# Patient Record
Sex: Male | Born: 1960 | Race: Black or African American | Hispanic: No | Marital: Single | Smoking: Never smoker
Health system: Southern US, Community
[De-identification: ages and names within clinical notes are randomized; demographics above are authoritative.]

## PROBLEM LIST (undated history)

## (undated) DIAGNOSIS — I509 Heart failure, unspecified: Secondary | ICD-10-CM

## (undated) DIAGNOSIS — F1010 Alcohol abuse, uncomplicated: Secondary | ICD-10-CM

## (undated) DIAGNOSIS — B882 Other arthropod infestations: Principal | ICD-10-CM

## (undated) HISTORY — DX: Other arthropod infestations: B88.2

## (undated) HISTORY — DX: Alcohol abuse, uncomplicated: F10.10

## (undated) HISTORY — PX: HERNIA REPAIR: SHX51

---

## 1999-04-04 ENCOUNTER — Emergency Department (HOSPITAL_COMMUNITY): Admission: EM | Admit: 1999-04-04 | Discharge: 1999-04-04 | Payer: Self-pay | Admitting: Emergency Medicine

## 1999-04-04 ENCOUNTER — Encounter: Payer: Self-pay | Admitting: Emergency Medicine

## 1999-05-27 ENCOUNTER — Emergency Department (HOSPITAL_COMMUNITY): Admission: EM | Admit: 1999-05-27 | Discharge: 1999-05-27 | Payer: Self-pay | Admitting: Emergency Medicine

## 2000-04-29 ENCOUNTER — Emergency Department (HOSPITAL_COMMUNITY): Admission: EM | Admit: 2000-04-29 | Discharge: 2000-04-29 | Payer: Self-pay | Admitting: *Deleted

## 2000-04-29 ENCOUNTER — Encounter: Payer: Self-pay | Admitting: *Deleted

## 2000-05-08 ENCOUNTER — Emergency Department (HOSPITAL_COMMUNITY): Admission: EM | Admit: 2000-05-08 | Discharge: 2000-05-08 | Payer: Self-pay | Admitting: Emergency Medicine

## 2000-08-23 ENCOUNTER — Encounter: Payer: Self-pay | Admitting: Emergency Medicine

## 2000-08-23 ENCOUNTER — Inpatient Hospital Stay (HOSPITAL_COMMUNITY): Admission: EM | Admit: 2000-08-23 | Discharge: 2000-08-26 | Payer: Self-pay | Admitting: Emergency Medicine

## 2000-08-24 ENCOUNTER — Encounter: Payer: Self-pay | Admitting: General Surgery

## 2000-10-13 ENCOUNTER — Encounter: Payer: Self-pay | Admitting: Gastroenterology

## 2000-10-13 ENCOUNTER — Ambulatory Visit (HOSPITAL_COMMUNITY): Admission: RE | Admit: 2000-10-13 | Discharge: 2000-10-13 | Payer: Self-pay | Admitting: Gastroenterology

## 2000-10-19 ENCOUNTER — Emergency Department (HOSPITAL_COMMUNITY): Admission: EM | Admit: 2000-10-19 | Discharge: 2000-10-19 | Payer: Self-pay | Admitting: Emergency Medicine

## 2000-11-05 ENCOUNTER — Ambulatory Visit (HOSPITAL_COMMUNITY): Admission: RE | Admit: 2000-11-05 | Discharge: 2000-11-05 | Payer: Self-pay | Admitting: General Surgery

## 2001-01-16 ENCOUNTER — Emergency Department (HOSPITAL_COMMUNITY): Admission: EM | Admit: 2001-01-16 | Discharge: 2001-01-16 | Payer: Self-pay | Admitting: Emergency Medicine

## 2002-02-02 ENCOUNTER — Encounter: Payer: Self-pay | Admitting: Emergency Medicine

## 2002-02-02 ENCOUNTER — Inpatient Hospital Stay (HOSPITAL_COMMUNITY): Admission: EM | Admit: 2002-02-02 | Discharge: 2002-02-03 | Payer: Self-pay | Admitting: *Deleted

## 2003-08-27 ENCOUNTER — Emergency Department (HOSPITAL_COMMUNITY): Admission: EM | Admit: 2003-08-27 | Discharge: 2003-08-27 | Payer: Self-pay | Admitting: *Deleted

## 2003-09-19 ENCOUNTER — Emergency Department (HOSPITAL_COMMUNITY): Admission: EM | Admit: 2003-09-19 | Discharge: 2003-09-19 | Payer: Self-pay | Admitting: Emergency Medicine

## 2003-09-19 ENCOUNTER — Encounter: Payer: Self-pay | Admitting: Emergency Medicine

## 2004-10-08 ENCOUNTER — Emergency Department (HOSPITAL_COMMUNITY): Admission: EM | Admit: 2004-10-08 | Discharge: 2004-10-08 | Payer: Self-pay | Admitting: Emergency Medicine

## 2004-12-20 ENCOUNTER — Emergency Department (HOSPITAL_COMMUNITY): Admission: EM | Admit: 2004-12-20 | Discharge: 2004-12-20 | Payer: Self-pay | Admitting: Emergency Medicine

## 2005-01-14 ENCOUNTER — Inpatient Hospital Stay (HOSPITAL_COMMUNITY): Admission: EM | Admit: 2005-01-14 | Discharge: 2005-01-16 | Payer: Self-pay | Admitting: Emergency Medicine

## 2007-04-10 ENCOUNTER — Ambulatory Visit: Payer: Self-pay | Admitting: Hospitalist

## 2007-04-10 ENCOUNTER — Inpatient Hospital Stay (HOSPITAL_COMMUNITY): Admission: EM | Admit: 2007-04-10 | Discharge: 2007-04-12 | Payer: Self-pay | Admitting: Emergency Medicine

## 2009-01-17 ENCOUNTER — Inpatient Hospital Stay (HOSPITAL_COMMUNITY): Admission: EM | Admit: 2009-01-17 | Discharge: 2009-01-20 | Payer: Self-pay | Admitting: Emergency Medicine

## 2009-12-02 ENCOUNTER — Emergency Department (HOSPITAL_COMMUNITY): Admission: EM | Admit: 2009-12-02 | Discharge: 2009-12-02 | Payer: Self-pay | Admitting: Emergency Medicine

## 2010-05-13 ENCOUNTER — Emergency Department (HOSPITAL_COMMUNITY): Admission: EM | Admit: 2010-05-13 | Discharge: 2010-05-13 | Payer: Self-pay | Admitting: Emergency Medicine

## 2010-05-16 ENCOUNTER — Emergency Department (HOSPITAL_COMMUNITY): Admission: EM | Admit: 2010-05-16 | Discharge: 2010-05-16 | Payer: Self-pay | Admitting: Emergency Medicine

## 2010-08-22 ENCOUNTER — Emergency Department (HOSPITAL_COMMUNITY): Admission: EM | Admit: 2010-08-22 | Discharge: 2010-08-22 | Payer: Self-pay | Admitting: Emergency Medicine

## 2010-09-18 ENCOUNTER — Emergency Department (HOSPITAL_COMMUNITY): Admission: EM | Admit: 2010-09-18 | Discharge: 2010-09-18 | Payer: Self-pay | Admitting: Emergency Medicine

## 2011-02-15 LAB — DIFFERENTIAL
Basophils Absolute: 0 10*3/uL (ref 0.0–0.1)
Eosinophils Relative: 0 % (ref 0–5)
Lymphocytes Relative: 7 % — ABNORMAL LOW (ref 12–46)
Neutro Abs: 6.7 10*3/uL (ref 1.7–7.7)
Neutrophils Relative %: 77 % (ref 43–77)

## 2011-02-15 LAB — URINALYSIS, ROUTINE W REFLEX MICROSCOPIC
Nitrite: NEGATIVE
Specific Gravity, Urine: 1.028 (ref 1.005–1.030)
Urobilinogen, UA: 1 mg/dL (ref 0.0–1.0)

## 2011-02-15 LAB — COMPREHENSIVE METABOLIC PANEL
AST: 63 U/L — ABNORMAL HIGH (ref 0–37)
BUN: 10 mg/dL (ref 6–23)
CO2: 24 mEq/L (ref 19–32)
Chloride: 96 mEq/L (ref 96–112)
Creatinine, Ser: 1.26 mg/dL (ref 0.4–1.5)
GFR calc non Af Amer: >60 mL/min (ref >60)
Glucose, Bld: 137 mg/dL — ABNORMAL HIGH (ref 70–99)
Total Bilirubin: 0.6 mg/dL (ref 0.3–1.2)

## 2011-02-15 LAB — CBC
HCT: 43.9 % (ref 39.0–52.0)
Hemoglobin: 15.2 g/dL (ref 13.0–17.0)
MCHC: 34.5 g/dL (ref 30.0–36.0)
MCV: 106.2 fL — ABNORMAL HIGH (ref 78.0–100.0)
RBC: 4.14 MIL/uL — ABNORMAL LOW (ref 4.22–5.81)
WBC: 8.7 10*3/uL (ref 4.0–10.5)

## 2011-02-15 LAB — URINE MICROSCOPIC-ADD ON

## 2011-02-15 LAB — RAPID URINE DRUG SCREEN, HOSP PERFORMED
Benzodiazepines: NOT DETECTED
Cocaine: NOT DETECTED
Tetrahydrocannabinol: POSITIVE — AB

## 2011-02-15 LAB — LIPASE, BLOOD: Lipase: 40 U/L (ref 11–59)

## 2011-02-17 LAB — DIFFERENTIAL
Lymphs Abs: 2.4 10*3/uL (ref 0.7–4.0)
Monocytes Relative: 9 % (ref 3–12)
Neutro Abs: 2.6 10*3/uL (ref 1.7–7.7)
Neutrophils Relative %: 47 % (ref 43–77)

## 2011-02-17 LAB — BASIC METABOLIC PANEL
Calcium: 8.3 mg/dL — ABNORMAL LOW (ref 8.4–10.5)
Chloride: 105 mEq/L (ref 96–112)
Creatinine, Ser: 0.84 mg/dL (ref 0.4–1.5)
GFR calc Af Amer: >60 mL/min (ref >60)

## 2011-02-17 LAB — CBC
RBC: 3.42 MIL/uL — ABNORMAL LOW (ref 4.22–5.81)
WBC: 5.5 10*3/uL (ref 4.0–10.5)

## 2011-03-01 ENCOUNTER — Emergency Department (HOSPITAL_COMMUNITY)
Admission: EM | Admit: 2011-03-01 | Discharge: 2011-03-01 | Disposition: A | Discharge status: Home / Self Care | Payer: Self-pay | Attending: Emergency Medicine | Admitting: Emergency Medicine

## 2011-03-01 ENCOUNTER — Emergency Department (HOSPITAL_COMMUNITY): Payer: Self-pay

## 2011-03-01 DIAGNOSIS — F1010 Alcohol abuse, uncomplicated: Secondary | ICD-10-CM | POA: Insufficient documentation

## 2011-03-01 DIAGNOSIS — M542 Cervicalgia: Secondary | ICD-10-CM | POA: Insufficient documentation

## 2011-03-01 DIAGNOSIS — R4789 Other speech disturbances: Secondary | ICD-10-CM | POA: Insufficient documentation

## 2011-03-01 DIAGNOSIS — W1809XA Striking against other object with subsequent fall, initial encounter: Secondary | ICD-10-CM | POA: Insufficient documentation

## 2011-03-01 DIAGNOSIS — Y9301 Activity, walking, marching and hiking: Secondary | ICD-10-CM | POA: Insufficient documentation

## 2011-03-18 ENCOUNTER — Emergency Department (HOSPITAL_COMMUNITY)
Admission: EM | Admit: 2011-03-18 | Discharge: 2011-03-18 | Disposition: A | Discharge status: Home / Self Care | Payer: Self-pay | Attending: Emergency Medicine | Admitting: Emergency Medicine

## 2011-03-18 ENCOUNTER — Emergency Department (HOSPITAL_COMMUNITY): Payer: Self-pay

## 2011-03-18 DIAGNOSIS — F1010 Alcohol abuse, uncomplicated: Secondary | ICD-10-CM | POA: Insufficient documentation

## 2011-03-18 DIAGNOSIS — G319 Degenerative disease of nervous system, unspecified: Secondary | ICD-10-CM | POA: Insufficient documentation

## 2011-03-18 DIAGNOSIS — F29 Unspecified psychosis not due to a substance or known physiological condition: Secondary | ICD-10-CM | POA: Insufficient documentation

## 2011-03-18 LAB — DIFFERENTIAL
Basophils Absolute: 0 K/uL (ref 0.0–0.1)
Basophils Absolute: 0 10*3/uL (ref 0.0–0.1)
Basophils Relative: 1 % (ref 0–1)
Basophils Relative: 0 % (ref 0–1)
Eosinophils Absolute: 0 K/uL (ref 0.0–0.7)
Eosinophils Absolute: 0.2 10*3/uL (ref 0.0–0.7)
Eosinophils Relative: 1 % (ref 0–5)
Eosinophils Relative: 4 % (ref 0–5)
Lymphocytes Relative: 57 % — ABNORMAL HIGH (ref 12–46)
Lymphocytes Relative: 27 % (ref 12–46)
Lymphs Abs: 3.1 K/uL (ref 0.7–4.0)
Lymphs Abs: 1.5 10*3/uL (ref 0.7–4.0)
Monocytes Absolute: 0.3 K/uL (ref 0.1–1.0)
Monocytes Relative: 6 % (ref 3–12)
Monocytes Relative: 9 % (ref 3–12)
Neutro Abs: 2 K/uL (ref 1.7–7.7)
Neutrophils Relative %: 36 % — ABNORMAL LOW (ref 43–77)

## 2011-03-18 LAB — CBC
HCT: 33.8 % — ABNORMAL LOW (ref 39.0–52.0)
HCT: 38.3 % — ABNORMAL LOW (ref 39.0–52.0)
HCT: 41.1 % (ref 39.0–52.0)
Hemoglobin: 11.6 g/dL — ABNORMAL LOW (ref 13.0–17.0)
Hemoglobin: 13.6 g/dL (ref 13.0–17.0)
MCH: 35.2 pg — ABNORMAL HIGH (ref 26.0–34.0)
MCHC: 34.3 g/dL (ref 30.0–36.0)
MCV: 102.4 fL — ABNORMAL HIGH (ref 78.0–100.0)
MCV: 102.1 fL — ABNORMAL HIGH (ref 78.0–100.0)
MCV: 105 fL — ABNORMAL HIGH (ref 78.0–100.0)
Platelets: 176 K/uL (ref 150–400)
Platelets: 121 10*3/uL — ABNORMAL LOW (ref 150–400)
RBC: 3.3 MIL/uL — ABNORMAL LOW (ref 4.22–5.81)
RBC: 3.91 MIL/uL — ABNORMAL LOW (ref 4.22–5.81)
RDW: 14.3 % (ref 11.5–15.5)
RDW: 13.6 % (ref 11.5–15.5)
WBC: 5.5 K/uL (ref 4.0–10.5)
WBC: 5.6 10*3/uL (ref 4.0–10.5)

## 2011-03-18 LAB — PHOSPHORUS
Phosphorus: 3.1 mg/dL (ref 2.3–4.6)
Phosphorus: 4.5 mg/dL (ref 2.3–4.6)

## 2011-03-18 LAB — HEPATIC FUNCTION PANEL
AST: 44 U/L — ABNORMAL HIGH (ref 0–37)
Albumin: 3.6 g/dL (ref 3.5–5.2)
Bilirubin, Direct: 0.2 mg/dL (ref 0.0–0.3)
Total Protein: 6.3 g/dL (ref 6.0–8.3)

## 2011-03-18 LAB — BASIC METABOLIC PANEL WITH GFR
BUN: 6 mg/dL (ref 6–23)
CO2: 21 meq/L (ref 19–32)
Calcium: 8.2 mg/dL — ABNORMAL LOW (ref 8.4–10.5)
Chloride: 106 meq/L (ref 96–112)
Creatinine, Ser: 0.76 mg/dL (ref 0.4–1.5)
GFR calc Af Amer: >60 mL/min (ref >60)
GFR calc non Af Amer: >60 mL/min (ref >60)
Glucose, Bld: 110 mg/dL — ABNORMAL HIGH (ref 70–99)
Potassium: 3.4 meq/L — ABNORMAL LOW (ref 3.5–5.1)
Sodium: 138 meq/L (ref 135–145)

## 2011-03-18 LAB — COMPREHENSIVE METABOLIC PANEL
Albumin: 3.1 g/dL — ABNORMAL LOW (ref 3.5–5.2)
BUN: 3 mg/dL — ABNORMAL LOW (ref 6–23)
Creatinine, Ser: 0.93 mg/dL (ref 0.4–1.5)
Total Protein: 5.6 g/dL — ABNORMAL LOW (ref 6.0–8.3)

## 2011-03-18 LAB — POCT I-STAT, CHEM 8
Chloride: 105 mEq/L (ref 96–112)
Creatinine, Ser: 1.2 mg/dL (ref 0.4–1.5)
HCT: 43 % (ref 39.0–52.0)
Hemoglobin: 14.6 g/dL (ref 13.0–17.0)
Potassium: 3.8 mEq/L (ref 3.5–5.1)
Sodium: 142 mEq/L (ref 135–145)

## 2011-03-18 LAB — MAGNESIUM
Magnesium: 1.9 mg/dL (ref 1.5–2.5)
Magnesium: 2.3 mg/dL (ref 1.5–2.5)

## 2011-03-18 LAB — AMYLASE: Amylase: 104 U/L (ref 27–131)

## 2011-03-18 LAB — HEMOGLOBIN AND HEMATOCRIT, BLOOD
HCT: 37.6 % — ABNORMAL LOW (ref 39.0–52.0)
HCT: 38.8 % — ABNORMAL LOW (ref 39.0–52.0)
Hemoglobin: 12.6 g/dL — ABNORMAL LOW (ref 13.0–17.0)
Hemoglobin: 12.7 g/dL — ABNORMAL LOW (ref 13.0–17.0)
Hemoglobin: 13.5 g/dL (ref 13.0–17.0)

## 2011-03-18 LAB — RAPID URINE DRUG SCREEN, HOSP PERFORMED
Amphetamines: NOT DETECTED
Barbiturates: NOT DETECTED
Tetrahydrocannabinol: NOT DETECTED

## 2011-03-18 LAB — BASIC METABOLIC PANEL
CO2: 21 mEq/L (ref 19–32)
Calcium: 8.9 mg/dL (ref 8.4–10.5)
Creatinine, Ser: 0.94 mg/dL (ref 0.4–1.5)
Glucose, Bld: 98 mg/dL (ref 70–99)

## 2011-03-18 LAB — POTASSIUM: Potassium: 3.3 mEq/L — ABNORMAL LOW (ref 3.5–5.1)

## 2011-03-18 LAB — ETHANOL: Alcohol, Ethyl (B): 294 mg/dL — ABNORMAL HIGH (ref 0–10)

## 2011-03-30 ENCOUNTER — Inpatient Hospital Stay (HOSPITAL_COMMUNITY)
Admission: EM | Admit: 2011-03-30 | Discharge: 2011-04-02 | DRG: 871 | Disposition: A | Discharge status: Home / Self Care | Payer: Self-pay | Attending: Family Medicine | Admitting: Family Medicine

## 2011-03-30 ENCOUNTER — Emergency Department (HOSPITAL_COMMUNITY): Payer: Self-pay

## 2011-03-30 DIAGNOSIS — K766 Portal hypertension: Secondary | ICD-10-CM | POA: Diagnosis present

## 2011-03-30 DIAGNOSIS — R1013 Epigastric pain: Secondary | ICD-10-CM | POA: Diagnosis present

## 2011-03-30 DIAGNOSIS — I748 Embolism and thrombosis of other arteries: Secondary | ICD-10-CM | POA: Diagnosis present

## 2011-03-30 DIAGNOSIS — Z66 Do not resuscitate: Secondary | ICD-10-CM | POA: Diagnosis present

## 2011-03-30 DIAGNOSIS — J189 Pneumonia, unspecified organism: Secondary | ICD-10-CM | POA: Diagnosis present

## 2011-03-30 DIAGNOSIS — A419 Sepsis, unspecified organism: Secondary | ICD-10-CM | POA: Diagnosis present

## 2011-03-30 DIAGNOSIS — K7030 Alcoholic cirrhosis of liver without ascites: Secondary | ICD-10-CM | POA: Diagnosis present

## 2011-03-30 DIAGNOSIS — T50995A Adverse effect of other drugs, medicaments and biological substances, initial encounter: Secondary | ICD-10-CM | POA: Diagnosis present

## 2011-03-30 DIAGNOSIS — D72819 Decreased white blood cell count, unspecified: Secondary | ICD-10-CM | POA: Diagnosis present

## 2011-03-30 DIAGNOSIS — N10 Acute tubulo-interstitial nephritis: Secondary | ICD-10-CM | POA: Diagnosis present

## 2011-03-30 DIAGNOSIS — B356 Tinea cruris: Secondary | ICD-10-CM | POA: Diagnosis present

## 2011-03-30 DIAGNOSIS — R188 Other ascites: Secondary | ICD-10-CM | POA: Diagnosis present

## 2011-03-30 DIAGNOSIS — K700 Alcoholic fatty liver: Secondary | ICD-10-CM | POA: Diagnosis present

## 2011-03-30 DIAGNOSIS — A4159 Other Gram-negative sepsis: Principal | ICD-10-CM | POA: Diagnosis present

## 2011-03-30 DIAGNOSIS — E876 Hypokalemia: Secondary | ICD-10-CM | POA: Diagnosis not present

## 2011-03-30 DIAGNOSIS — R209 Unspecified disturbances of skin sensation: Secondary | ICD-10-CM | POA: Diagnosis present

## 2011-03-30 DIAGNOSIS — K2960 Other gastritis without bleeding: Secondary | ICD-10-CM | POA: Diagnosis present

## 2011-03-30 DIAGNOSIS — F1020 Alcohol dependence, uncomplicated: Secondary | ICD-10-CM | POA: Diagnosis present

## 2011-03-30 LAB — URINALYSIS, ROUTINE W REFLEX MICROSCOPIC
Glucose, UA: 250 mg/dL — AB
Protein, ur: 30 mg/dL — AB

## 2011-03-30 LAB — DIFFERENTIAL
Basophils Absolute: 0 10*3/uL (ref 0.0–0.1)
Basophils Relative: 0 % (ref 0–1)
Eosinophils Absolute: 0 10*3/uL (ref 0.0–0.7)
Neutro Abs: 2.8 10*3/uL (ref 1.7–7.7)
Neutrophils Relative %: 93 % — ABNORMAL HIGH (ref 43–77)

## 2011-03-30 LAB — CBC
Platelets: 131 10*3/uL — ABNORMAL LOW (ref 150–400)
RBC: 3.24 MIL/uL — ABNORMAL LOW (ref 4.22–5.81)
WBC: 3 10*3/uL — ABNORMAL LOW (ref 4.0–10.5)

## 2011-03-30 LAB — COMPREHENSIVE METABOLIC PANEL
ALT: 20 U/L (ref 0–53)
AST: 49 U/L — ABNORMAL HIGH (ref 0–37)
Albumin: 2.4 g/dL — ABNORMAL LOW (ref 3.5–5.2)
Alkaline Phosphatase: 86 U/L (ref 39–117)
Chloride: 106 mEq/L (ref 96–112)
Creatinine, Ser: 0.85 mg/dL (ref 0.4–1.5)
GFR calc Af Amer: >60 mL/min (ref >60)
Potassium: 3.5 mEq/L (ref 3.5–5.1)
Sodium: 138 mEq/L (ref 135–145)
Total Bilirubin: 0.4 mg/dL (ref 0.3–1.2)

## 2011-03-30 LAB — OCCULT BLOOD, POC DEVICE: Fecal Occult Bld: NEGATIVE

## 2011-03-30 LAB — URINE MICROSCOPIC-ADD ON

## 2011-03-30 MED ORDER — IOHEXOL 300 MG/ML  SOLN
125.0000 mL | Freq: Once | INTRAMUSCULAR | Status: AC | PRN
  Administered 2011-03-30: 125 mL via INTRAVENOUS

## 2011-03-31 LAB — TSH: TSH: 1.347 u[IU]/mL (ref 0.350–4.500)

## 2011-03-31 LAB — CBC
Hemoglobin: 9.5 g/dL — ABNORMAL LOW (ref 13.0–17.0)
MCH: 33.6 pg (ref 26.0–34.0)
MCV: 101.4 fL — ABNORMAL HIGH (ref 78.0–100.0)
RBC: 2.83 MIL/uL — ABNORMAL LOW (ref 4.22–5.81)

## 2011-03-31 LAB — VITAMIN B12: Vitamin B-12: 346 pg/mL (ref 211–911)

## 2011-03-31 LAB — T4, FREE: Free T4: 0.99 ng/dL (ref 0.80–1.80)

## 2011-04-01 LAB — CBC
HCT: 28.8 % — ABNORMAL LOW (ref 39.0–52.0)
Hemoglobin: 9.8 g/dL — ABNORMAL LOW (ref 13.0–17.0)
RDW: 12.9 % (ref 11.5–15.5)
WBC: 9.5 10*3/uL (ref 4.0–10.5)

## 2011-04-01 LAB — BASIC METABOLIC PANEL
CO2: 25 mEq/L (ref 19–32)
GFR calc Af Amer: >60 mL/min (ref >60)
GFR calc non Af Amer: >60 mL/min (ref >60)
Glucose, Bld: 93 mg/dL (ref 70–99)
Potassium: 3.3 mEq/L — ABNORMAL LOW (ref 3.5–5.1)
Sodium: 140 mEq/L (ref 135–145)

## 2011-04-01 LAB — URINE CULTURE

## 2011-04-01 LAB — DRUGS OF ABUSE SCREEN W/O ALC, ROUTINE URINE
Amphetamine Screen, Ur: NEGATIVE
Marijuana Metabolite: NEGATIVE
Propoxyphene: NEGATIVE

## 2011-04-02 LAB — URINE CULTURE
Colony Count: NO GROWTH
Culture  Setup Time: 201205020113
Culture: NO GROWTH
Special Requests: NEGATIVE

## 2011-04-02 LAB — CULTURE, BLOOD (ROUTINE X 2): Culture  Setup Time: 201204291143

## 2011-04-02 LAB — BASIC METABOLIC PANEL
CO2: 27 mEq/L (ref 19–32)
Calcium: 8.4 mg/dL (ref 8.4–10.5)
Chloride: 105 mEq/L (ref 96–112)
Glucose, Bld: 111 mg/dL — ABNORMAL HIGH (ref 70–99)
Potassium: 3.1 mEq/L — ABNORMAL LOW (ref 3.5–5.1)
Sodium: 138 mEq/L (ref 135–145)

## 2011-04-02 LAB — CBC
HCT: 29.2 % — ABNORMAL LOW (ref 39.0–52.0)
Hemoglobin: 10.2 g/dL — ABNORMAL LOW (ref 13.0–17.0)
MCHC: 34.9 g/dL (ref 30.0–36.0)
RBC: 2.93 MIL/uL — ABNORMAL LOW (ref 4.22–5.81)

## 2011-04-02 NOTE — Consult Note (Signed)
[name], [name]                ACCOUNT NO.:  [id number]  MEDICAL RECORD NO.:  [id number]           PATIENT TYPE:  O  LOCATION:  1517                         FACILITY:  [facility]  PHYSICIAN:  Sharlet Salina T. Nashaun Hillmer, M.D.DATE OF BIRTH:  [birth year]  DATE OF CONSULTATION: DATE OF DISCHARGE:                                CONSULTATION   CHIEF COMPLAINT:  Fever, abdominal pain, nausea and vomiting.  HISTORY OF PRESENT ILLNESS:  I was asked by Dr. Azalia Bilis at the [facility] Emergency Room to evaluate [name].  He is a 50 year old male with a long history of alcohol abuse and multiple admissions secondary to complications from this.  He has previous admissions and history of acute and chronic pancreatitis and pseudocyst.  He has had alcoholic hepatitis and probably has early cirrhosis.  He has history of alcoholic gastritis and peptic ulcer disease.  He continues to drink on a daily basis.  The patient states that about 3 days ago, he developed nausea and vomiting.  He occasionally gets this and did not seek treatment.  This resolved but yesterday he developed epigastric abdominal pain and then fever.  He has presented to the emergency room today with these complaints.  Among other studies obtained, as detailed below was a CT scan showing an edematous gallbladder and I was asked to see the patient in regards to question of cholecystitis.  The patient currently states that his abdominal pain is actually much better.  He is hungry and asking for something to eat.  On questioning, he has been having urinary frequency.  No cough.  PAST MEDICAL HISTORY:  Significant for alcohol abuse and multiple complications of this as detailed above.  PAST SURGICAL HISTORY:  Umbilical hernia repair and an apparent surgery for peptic ulcer disease number of years ago.  He had strabismus repair.  MEDICATIONS:  No current prescription medications.  ALLERGIES:  none.  SOCIAL HISTORY:  Lives with his  girlfriend.  Denies cigarettes.  Drinks on a daily basis.  FAMILY HISTORY:  Positive for hypertension.  REVIEW OF SYSTEMS:  GENERAL:  Positive fever and chills as above. PULMONARY:  Denies cough, shortness of breath, sputum production. CARDIAC:  Denies chest pain, palpitations, history of heart disease. GI:  As above.  GU:  As above.  PHYSICAL EXAMINATION:  VITAL SIGNS:  Temperature on presentation was 102.8, it is now 99, heart rate was 120 on presentation, is now 100, blood pressure 105/67, respirations 18, O2 sats 100%. GENERAL:  He is alert [place] male, he does not appear in any distress. SKIN:  Warm and dry.  No rash or infection. HEENT:  There is strabismus with deviation of the right eye laterally. Sclerae muddy but not apparently icteric.  Pupils are reactive.  Poor dentition. LYMPH NODES:  No cervical, supraclavicular or inguinal nodes palpable. LUNGS:  Clear without wheezing or increased work of breathing. CARDIAC:  Regular mild tachycardia.  No murmurs, no edema.  Peripheral pulses intact. ABDOMEN:  Healed incisions.  There is a small apparently primary supraumbilical hernia, not completely reducible but not tender.  There is moderate tenderness across the  epigastrium with some slight guarding. No discernable masses or organomegaly. EXTREMITIES:  Thin, no joint swelling, deformity or edema. NEUROLOGIC:  He is alert, fully oriented.  Motor and sensory exam is grossly normal.  LABORATORY AND X-RAY:  Urinalysis shows too numerous to count white cells, moderate leukocyte esterase, lipase is 18.  LFTs are all within normal limits, although albumin is 2.4.  White count is 3.0, hemoglobin 11.1, platelets 131.  Personally reviewed CT scan and ultrasound.  CT shows a thickened edematous gallbladder wall but no stones.  There is significant abnormality of the liver with diffuse steatosis.  There is a stable hemangioma in lateral segment of left lobe.  There is a  diminutive left portal vein and increased enhancement of left liver felt to be altered vascularity.  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  Also again noted, diffuse gallbladder wall thickening, nonspecific and edema in the porta hepatis and root of the mesentry as well.  Pancreas is currently unremarkable.  Ultrasound of the abdomen was obtained which again shows an edematous gallbladder wall, no stones.  ASSESSMENT/PLAN:  A 50 year old male with no history of alcohol abuse, previous history of alcohol hepatitis, pancreatitis, acute-on-chronic pseudocyst, gastritis and peptic ulcer disease.  He presents with abdominal pain and fever.  He clearly has urinary tract infection, also possibly right lower lobe pneumonia on chest x-ray which could account for his fever.  I do not believe he has primary cholecystitis.  He appears to have edema, not only at the gallbladder but the porta hepatis and root of mesentry which I feel is likely secondary to some combination of acute pancreatitis, duodenitis and/or hepatitis and hypoalbuminemia.  He does not have elevated lipase or LFTs however. Despite this, I think gallbladder being his primary source or this being an acute cholecystitis is extremely unlikely.  The believe the patient needs admission and treatment for fever, urinary tract infection, possible pneumonia and EtOH abuse.  He is actually having decreased abdominal pain now and I would plan to just observe this currently.     [name]. [name], M.D.     [name]  D:  03/30/2011  T:  03/31/2011  Job:  [id number]  Electronically Signed by [name] M.D. on 04/02/2011 08:25:11 AM

## 2011-04-05 LAB — CULTURE, BLOOD (ROUTINE X 2): Culture: NO GROWTH

## 2011-04-14 NOTE — Discharge Summary (Signed)
**Note [name] via Obfuscation** [name], [name]                ACCOUNT NO.:  [id number]  MEDICAL RECORD NO.:  [id number]           PATIENT TYPE:  I  LOCATION:  1517                         FACILITY:  [facility]  PHYSICIAN:  Kela Millin, M.D.DATE OF BIRTH:  1961-01-07  DATE OF ADMISSION:  03/30/2011 DATE OF DISCHARGE:  04/02/2011                        DISCHARGE SUMMARY - REFERRING   DISCHARGE DIAGNOSES: 1. Acute pyelonephritis secondary to Klebsiella pneumoniae. 2. Sepsis syndrome secondary to #1/bacteremia. 3. Hepatic steatosis plus/minus early cirrhosis with portal     hypertension. 4. Alcohol dependence/abuse. 5. Gastritis - Likely secondary to alcohol. 6. Tinea cruris.  PROCEDURES AND STUDIES: 1. Abdominal x-ray on April 29th - Patchy right lower lobe atelectasis     versus airspace disease.  Nonobstructive bowel gas pattern. 2. A CT scan of abdomen and pelvis with contrast - No pancreatic     necrosis, abscess, or pseudocyst.  Mild atelectasis in both lung     bases.  Diffuse hepatic steatosis.  Altered vascularity in the left     hepatic lobe, with a diminutive left portal vein and extensive     portal-systemic shunting in the upper abdomen.  Stable     hypervascular lesion in the lateral segment, left hepatic lobe may     be feeling of a hemangioma or a vascular malformation.  New small     hypodensity in the right hepatic lobe is technically nonspecific,     although statistically likely to be benign.  Stable hypodense     lesion in the lateral segment of the left hepatic lobe considered     benign.  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  Diffuse     gallbladder wall thickening possibly from hypoproteinemia, although     cholecystitis cannot be confidently excluded.  Edema in the root of     the mesentery and porta hepatis nonspecific. 3. Abdominal ultrasound on April 29th - Thickened edematous-appearing     gallbladder wall without pain over the gallbladder and no     gallstones.   Consider changes of hypoproteinemia versus acalculous     cholecystitis.  Diffuse fatty infiltration of the liver.  The     pancreas was obscured.  CONSULTATIONS:  General Surgery - Dr. Johna Sheriff.  BRIEF HISTORY:  The patient is a 50 year old black male with the above- listed medical problems who presented with complaints of abdominal pain. He reported that the pain was epigastric in location.  He stated that he had an episode of vomiting on the day prior to admission, but had not had any further vomiting, but stated that he had not eaten anything on the day of admission.  He admitted to loose stools and stated that they have started on the day prior to admission.  He also complained of headache.  He admitted to subjective fevers and in the ED, his temperature was 102.5.  He denied dysuria, but it was noted in the ED that his urinalysis was consistent with UTI.  He denied any hematemesis or melena.  He had a CT scan of his abdomen and pelvis done in the ED and the results are as  stated above.  The ED physician consulted General Surgery and Dr. Johna Sheriff saw the patient and his impression was that his symptoms were secondary to UTI with possible pneumonia and alcohol.  The patient was admitted for further evaluation and management.  HOSPITAL COURSE: 1. Acute pyelonephritis - Secondary to Klebsiella pneumoniae - Upon     admission, blood and urine cultures were obtained and the patient     was started on empiric antibiotics to include coverage for possible     right lower lobe pneumonia as well.  The patient's symptoms     improved on the antibiotics and his nausea, vomiting as well as     abdominal pain have resolved at this time.  The blood and urine     cultures grew klebsiella that was sensitive to the Levaquin that he     was on.  He has remained afebrile and his leukopenia has resolved -     His white cell count today prior to discharge is 6.4.  He will be     discharged on oral  antibiotics to complete a 2-week course and is     to follow up outpatient at [facility]. 2. Klebsiella pneumoniae sepsis syndrome - As discussed above.  On     admission, blood cultures were obtained and the patient was started     on empiric antibiotics.  He was noted to be tachycardic and febrile     as stated above.  His blood cultures grew Klebsiella pneumoniae,     which is sensitive to the quinolones that he has been on.  He has     remained hemodynamically stable and his white cell count today     prior to discharge is 6.4.  He will be discharged on oral     antibiotics to complete a 2-week course and he is to follow up at     [facility]. 3. Hepatic steatosis plus/minus early cirrhosis with portal     hypertension - As per CT scan findings, also revealing chronically     thrombosed splenic vein.  The patient was counseled to quit     alcohol, his LFTs on admission were unremarkable except for a     slightly elevated AST of 49.  The patient has been set up to follow     up at [facility] for referral to GI for further evaluation and     management as outpatient. 4. Edematous gallbladder - This was noted on the CT scan.  General     Surgery was consulted to see the patient and Dr. Johna Sheriff saw the     patient and his impression was that the patient did not have     primary cholecystitis.  He stated that he appeared to have edema,     not only at the gallbladder, but at the porta hepatis and root of     mesentery and he thought that this was likely secondary to some     combination of acute pancreatitis/duodenitis and/or hepatitis.  The     patient's pancreatic enzymes came back within normal limits.  Dr.     Johna Sheriff recommended observing the patient and with treatment of     his pyelonephritis, his symptoms resolved.  He is asymptomatic at     this time and tolerating p.o.'s well and he is to follow up     outpatient at [facility] and outpatient followup referral with Dr.      Johna Sheriff to be considered as  clinically appropriate. 5. Alcohol abuse/dependence - The patient was placed on Ativan detox     protocol during his hospital stay and he has had no signs of     withdrawal.  He was also placed on thiamine, multivitamins, and     folic acid, which he is to continue upon discharge.  He was     cancelled to quit alcohol and is to follow up outpatient. 6. Gastritis - Likely secondary to alcohol.  The patient was placed on     a PPI during this hospital stay and is to continue it upon     discharge.  As already mentioned above, the patient was counseled     to quit alcohol. 7. Tinea cruris - The patient was placed on Lotrimin cream during this     hospital stay and he is to continue it upon discharge.  DISCHARGE MEDICATIONS: 1. Clotrimazole 1% cream apply to affected area b.i.d. 2. Folic acid 1 mg p.o. daily. 3. Levaquin 500 mg 1 p.o. daily for 10 more days. 4. Ativan as directed per med rec form. 5. Multivitamins 1 p.o. daily. 6. Prilosec 40 mg 1 p.o. daily. 7. Thiamine 100 mg p.o. daily.  DISCHARGE CONDITION:  Improved/stable.  FOLLOWUP CARE:  [facility] with Dr. Audria Nine as scheduled on June 21st at 9:30 a.m.     Kela Millin, M.D.     ACV/MEDQ  D:  04/02/2011  T:  04/02/2011  Job:  [id number]  cc:   Maurice March, M.D. Fax: [phone]  Electronically Signed by Donnalee Curry M.D. on 04/14/2011 11:32:45 AM

## 2011-04-15 NOTE — Consult Note (Signed)
NAMESAGAN, MASELLI NO.:  [id number]   MEDICAL RECORD NO.:  [id number]          PATIENT TYPE:  INP   LOCATION:  1437                         FACILITY:  [facility]   PHYSICIAN:  Antonietta Breach, M.D.  DATE OF BIRTH:  [birth year]   DATE OF CONSULTATION:  01/18/2009  DATE OF DISCHARGE:  01/20/2009                                 CONSULTATION   REFERRING PHYSICIAN:  [facility].   REASON FOR CONSULTATION:  Alcohol dependence.   HISTORY OF PRESENT ILLNESS:  [name] is a 50 year old male  admitted to the [facility] on  January 16, 2009 for  pancreatitis and gastrointestinal bleed.   [name] has been drinking large amounts of alcohol daily.  He has  developed acute pancreatitis as a result.   He does have normal interests and future goals.  He does not have any  hallucinations or delusions.  He is cooperative with bedside care.  His  memory and orientation function are intact.   PAST PSYCHIATRIC HISTORY:  Multiple complications of alcohol use.   In review of the past medical record, in 2001 he had alcohol-induced  pancreatitis as well as hyperbilirubinemia.   FAMILY PSYCHIATRIC HISTORY:  None known.   SOCIAL HISTORY:  [name] is single.  He has severe alcoholism.   PAST MEDICAL HISTORY:  1. Alcohol-induced pancreatitis.  2. Macrocytosis.  3. Peptic ulcer disease.   ALLERGIES:  NO KNOWN DRUG ALLERGIES.   MEDICATIONS:  [name] is on the Ativan withdrawal protocol.   LABORATORY DATA:  SGOT and SGPT are unremarkable.  Albumin 3.1, MCV 102,  platelet count 121.   REVIEW OF SYSTEMS:  Noncontributory.   EXAMINATION:  VITAL SIGNS:  Temperature 98.7, pulse 85, respiratory rate  20, blood pressure 140/85, O2 saturation on room air is 96%.   MENTAL STATUS EXAM:  [name] is alert.  His attention span is normal.  His eye contact is good.  His concentration is within normal limits.  His mood is within normal limits.  His affect is broad and  appropriate.  His fund of knowledge and intelligence are normal.  His speech involves  normal rate and prosody.  Thought process logical, coherent and goal-  directed.  No looseness of associations.  Thought content:  No thoughts  of harming himself; no thoughts of harming others; no delusions, no  hallucinations.  Insight is intact.  Judgment is intact.   ASSESSMENT:  AXIS I:  Alcohol dependence  AXIS II:  Deferred.  AXIS III:  See past medical history.  AXIS IV:  Primary support group general medical.  AXIS V:  55.   [name] is not at risk to harm himself or others.  He agrees to call  emergency services immediately for any thoughts of harming himself or  others, or distress.   The undersigned provided education and ego support.   [name] Is motivated for the [facility] chemical dependence  rehabilitation program.  He also wants to live in the [facility].  The social worker is working with him on arranging these  programs.  He  will also participate in 12-step meetings.      Antonietta Breach, M.D.  Electronically Signed     JW/MEDQ  D:  01/21/2009  T:  01/21/2009  Job:  [id number]

## 2011-04-15 NOTE — H&P (Signed)
[name], [name] NO.:  [id number]   MEDICAL RECORD NO.:  [id number]          PATIENT TYPE:  EMS   LOCATION:  ED                           FACILITY:  [facility]   PHYSICIAN:  Vania Rea, M.D. DATE OF BIRTH:  [birth year]   DATE OF ADMISSION:  01/17/2009  DATE OF DISCHARGE:                              HISTORY & PHYSICAL   PRIMARY CARE PHYSICIAN:  Unassigned.   CHIEF COMPLAINT:  Abdominal pain.   HISTORY OF PRESENT ILLNESS:  This is a 49 year old African American  gentleman with history of chronic alcohol abuse, past history of peptic  ulcer surgery 10 years ago, and a history of admission for acute  pancreatitis 1 year ago, associated alcohol abuse.  The patient has been  having abdominal pains on and off for the past month.  Says he did not  realize pancreatitis was associated with alcohol abuse.  Has been pretty  much ignoring abdominal pain.  Has continued to drink about two 40 oz of  beer per day, but yesterday he drank a 1/5 of whiskey and the patient  began having worsening abdominal pain and had an episode of vomiting and  passing bloody stool this morning per the patient.  The patient says  there was a lot of blood in the stool, but there was only scant amount  of blood in the vomitus.  The abdominal pain became intolerable, and he  came to the emergency room to seek relief.  The patient was evaluated,  and the hospitalist service called to assist with management.  The  patient denies any dizziness or syncope.  Denied any chest pains or  shortness of breath.  He denies any yellowing of his eyes.  Indicates  desire to discontinue alcohol abuse.   PAST MEDICAL HISTORY:  1. Alcohol abuse.  2. History of pancreatitis.  3. History of peptic ulcer disease.   MEDICATIONS:  None.   ALLERGIES:  No known drug allergies.   SOCIAL HISTORY:  Other than noted above unremarkable.   FAMILY HISTORY:  Significant only for hypertension.  No family history  of  addictions.   REVIEW OF SYSTEMS:  Other than noted above unremarkable.   PHYSICAL EXAMINATION:  Well-built African American man lying in bed,  alcoholic facies, strong aldehyde on his breath.  VITALS:  Temperature is 97.8, pulse 72, respirations 18, blood pressure  143/87, saturating 98% on room air.  Pupils are round and equal.  Mucous  membranes pink and anicteric.  His face is puffy.  No cervical  lymphadenopathy or thyromegaly.  CHEST:  Clear to auscultation bilaterally.  CARDIOVASCULAR:  Regular rhythm without murmurs.  ABDOMEN:  Slightly distended, exquisitely tender in the epigastrium,  decreased abdominal bowel sounds.  EXTREMITIES:  Without edema.  Has 3+  bounding pulses bilaterally.  CENTRAL NERVOUS SYSTEM:  Cranial nerves II-XII are grossly intact.  He  has no focal neurologic deficit.   LABORATORIES:  CBC is remarkable for white count 5.6, hemoglobin 13.6,  MCV 105, platelets 161 with a normal differential.  His serum chemistry  is unremarkable.  His BUN is  4, his creatinine is 1.2.  Sodium is 142,  potassium 3.8.  His liver function:  His AST is slightly elevated at 44,  ALT normal at 23, alk phos normal at 70, total bilirubin normal at 0.5.  His alcohol level is elevated to 294.  His lipase is elevated at 207.  His occult blood is positive, but the stool is reportedly brown.   ASSESSMENT:  1. Acute pancreatitis.  2. Acute gastritis associated with alcohol abuse.  3. Chronic alcoholism.  4. Alcohol intoxication.  5. History of peptic ulcer disease.  6. Macrocytosis.   PLAN:  1. Will admit this gentleman for hydration, keep him n.p.o., pain      control for his acute pancreatitis, serial H and H's.  If there is      any objective evidence of GI bleeding, will consult      gastroenterology  2. Will put him on an alcohol withdrawal protocol since he reports      that his girlfriend indicates that he becomes shaky when he does      not drink and he may be a  candidate for a later detox.  Other plans      as per orders.      Vania Rea, M.D.  Electronically Signed     LC/MEDQ  D:  01/17/2009  T:  01/17/2009  Job:  [id number]

## 2011-04-15 NOTE — Discharge Summary (Signed)
NAMEBRIANT, [name] NO.:  [id number]   MEDICAL RECORD NO.:  [id number]          PATIENT TYPE:  INP   LOCATION:  1437                         FACILITY:  [facility]   PHYSICIAN:  Charlestine Massed, MDDATE OF BIRTH:  1961/07/17   DATE OF ADMISSION:  01/16/2009  DATE OF DISCHARGE:  01/20/2009                               DISCHARGE SUMMARY   1. Primary care physician so far, the patient is unassigned.  He has      been given information to look for a primary physician himself.  2. Alcohol detox at [facility].   REASON FOR ADMISSION:  Abdominal pain.   DISCHARGE DIAGNOSES:  1. Alcohol abuse.  2. Acute on chronic pancreatitis.  3. Chronic recurrent gastritis/peptic ulcer disease secondary to      alcohol.   DISCHARGE MEDICATIONS:  1. Folic acid 1 mg p.o. daily.  2. Prilosec 20 mg p.o. b.i.d.  3. Thiamine 100 mg p.o. daily.  4. Reglan 10 mg p.o. q.6 h. p.r.n. for vomiting.   HOSPITAL COURSE:  1. Recurrent alcohol abuse with alcohol dependence.  The patient was      admitted for alcohol with complaints of severe abdominal pain after      he drank one fifth bottle of whiskey with episode of vomiting and      bloody stools in the morning.  The patient was evaluated and found      to have alcohol-induced gastritis.  The patient has had history of      peptic ulcer disease ten years ago  for which he had surgery.  He      has had prior admission of acute pancreatitis 1 year ago with      alcohol abuse.  He continues to drink alcohol.  The patient was      admitted for acute pancreatitis secondary to alcohol abuse.  Was      observed on the floor with IV fluids, was put on n.p.o.  Once the      pain resolved and his acute clinical condition was stable, he was      started on p.o. feeds,  currently the patient is on regular feeds      and is tolerating it well.  The patient was seen by Dr. Jeanie Sewer      for alcohol related issues and advised outpatient alcohol     detoxification versus inpatient.  The patient chose to go for      outpatient detox as he is working and folate.  Patient has been set      for outpatient for detox at [facility] and has been given      appointment date by the social worker.  Continue on Prilosec,      thiamine and folic acid with Reglan p.r.n. for vomiting.  To follow      up with gastroenterology as outpatient after seeing the primary      care doctor.  We will refer the patient the patient to      Gastroenterology for further followup as outpatient.  2. History of pancreatitis,  currently is resolved.  The patient has      acute on chronic pancreatitis possibly.  The patient has been      educated about his pancreatitis and has been told that smoking with      alcohol-induced pancreatitis will place him at a high risk for      getting pancreatic cancer.  The patient understood well and he      agreed for alcohol detox.  3. Peptic ulcer disease. The patient had acute episode of gastritis-      induced blood.  After that, the patient's hemoglobin has stayed      stable over the past 72 hours.  No further need for any workup now.      Further workup can be done as outpatient.  The patient has been      educated about that.  His primary care doctor needs to refer him to      an outpatient Gastroenterologist.  We will continue Prilosec 20 mg      b.i.d. for that.   CONSULTATIONS:  Psychiatry, Antonietta Breach, M.D. for alcohol  dependence.   DISCHARGE INSTRUCTIONS:  1. Follow up for alcohol and detox program as has been set by social      worker  2. Set up with your primary care doctor as instructed by social      worker.  Information has been given.  See the primary care doctor      as soon as possible after discharge.  Follow up with primary care      MD as soon as appointment is available.  3. Primary care MD to send the patient for outpatient follow up with      the Gastroenterologist for pancreatitis as well  as gastritis.   Total of 60 minutes was spent on this discharge evaluation and discharge  set up.      Charlestine Massed, MD  Electronically Signed     UT/MEDQ  D:  01/20/2009  T:  01/20/2009  Job:  [id number]   cc:   Alcohol Detox [facility]

## 2011-04-15 NOTE — Discharge Summary (Signed)
[name] NO.:  [id number]   MEDICAL RECORD NO.:  [id number]          PATIENT TYPE:  INP   LOCATION:  5707                         FACILITY:  [facility]   PHYSICIAN:  Beatrix Fetters, M.D.     DATE OF BIRTH:  09/26/1961   DATE OF ADMISSION:  04/10/2007  DATE OF DISCHARGE:  04/12/2007                               DISCHARGE SUMMARY   CONTINUITY PHYSICIAN:  Dr. Kathyrn Sheriff at the [facility].   DISCHARGE DIAGNOSES:  1. Acute pancreatitis secondary to alcohol abuse.  2. Ongoing alcohol abuse.  3. Elevated transaminases secondary to alcoholic hepatitis.  4. Bright red blood per rectum secondary to anal fissure.   DISCHARGE MEDICATIONS:  Multivitamins.   FOLLOWUP:  Patient is to followup in the outpatient clinic with Dr.  Kathyrn Sheriff on May 28 at 4 o'clock p.m., at that time, we are to review his  abdominal symptoms, abdominal pain and discuss how the cessation from  alcohol is going.   PROCEDURES PERFORMED:  Include an abdominal ultrasound to evaluate for a  possible gallstone complicating his pancreatitis and to evaluate a  possible pseudocyst.  The right upper quadrant showed limited  visualization of the pancreas.  If there were continued concern for a  pseudocyst, a CT scan was recommended and a small hepatitic hemangioma  was noted.   There were no consultations.   ADMITTING H&P:  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  On the morning of admission,  the patient experienced a sharp pain on the lower right side of his  abdomen that was associated with nausea.  He also felt like he had a  fever on the night before admission.  Patient had been drinking, at  least, one 40-ounce of Old English every day prior to his admission and  had drank many of those on the  night prior to admission.  Patient did  not eat breakfast; however, when he was in the ED he did have a Coke and  the pain returned when he drank the Coke.  Of note, the patient did not  have any episodes of vomiting.  Patient denied shortness of breath,  chest pain, constipation or diarrhea.  The patient also reports seeing a  small amount of blood after a hard stool on the toilet tissue that  happened 1-2 times in the past week or two.  He denies melena or large  volumes of blood per rectum.  Patient is on no medications.  He drinks  every day.   VITAL SIGNS:  Temperature 98.6.  Blood pressure 125/92.  Pulse is 77.  Respirations 16.  O2 saturation 99% on room air.  GENERAL:  On exam, the patient was in no acute distress.  HEENT:  His eyes showed right eye exotropic.  The pupils were equal and  reactive.  LUNG EXAM:  Clear.  HEART EXAM:  Normal.  ABDOMINAL  EXAM:  Significant for being soft and diffusely tender to  palpation with the right side greater than left with positive bowel  sounds.   ADMISSION LABS:  Sodium 140, potassium 4.3, chloride 107, bicarb 24, BUN  7, creatinine 1.0 with a glucose of 102.  Bilirubin 0.7, alkaline  phosphatase 76, AST 124, ALT 68, protein 7.5, albumin 3.9, calcium 9.2.  White count 5.0, hemoglobin 14.9, platelets 171 with an MCV of 101.7.  His lipase was 189.  His UA was significant for a specific gravity of  1.023, trace hemoglobin, protein greater than 300, but nitrite and  leukocyte esterase negative.   HOSPITAL COURSE:  1. Acute pancreatitis secondary to alcohol abuse.  When the patient      presented with abdominal pain, the differential included acute      cholecystitis, cholangitis, appendicitis; however, the lipase is      significantly elevated to 189, which did support the pancreatitis      as a diagnosis.  However, the fact the patient had a normal white      count, the patient was afebrile, not tachycardic and that he was      hungry  made [place] feel that he was relatively stable and admitted him      to a regular bed.  There was currently no signs of abscess during      his hospital stay, meaning that the patient did not have an      elevated white count or fever and that his abdominal pain improved      during his admission; therefore, we did not feel that a CT scan was      warranted even with his history of pseudocyst.  The patient was      simply placed on IV fluids, made n.p.o. and was given p.r.n.      morphine.  An ultrasound of the abdomen was performed to evaluate      for a possible gallstone.  A fasting lipid panel was done to      evaluate for elevated triglycerides, which they were not.  Patient      was placed was on _______ protocol.  Patient also had an alcohol      level and urine drug screen.  Alcohol level was significant for      greater than 50 and the urine drug screen was positive for cocaine.  2. Ongoing alcohol abuse.  The patient has a history of multiple      episodes of acute pancreatitis, yet he continues to drink.  This      will prove to be a significant obstacle for him in the future and      will continue to aggravate his pancreas until he quits drinking.  3. Alcoholic hepatitis.  Patient was found to have elevated      transaminases on presentation with his AST greater than ALT by a      ratio of 2:1, which supports the diagnosis of alcoholic hepatitis      and is also consistent with the patient's history.  4. Bright red blood per rectum secondary to anal fissure.  Patient had      seen a little bit of blood on the toilet tissue 1-2 times in the      previous 3 weeks prior to admission.  It is described as a small      amount of blood with no melena or large amounts of blood.  This was  deemed to be likely insignificant and a fiber supplement was      recommended to soften this patient's stools.  The patient was FOPT     negative.   DISCHARGE LABORATORIES:  Lipase 46, sodium 134,  potassium 4.0, chloride  101, bicarb 25, glucose 85, BUN 6, creatinine 0.98 with a calcium of  8.4.  CBC:  White count 6.4, hemoglobin 13.0, platelets 115.  RBC folate  was elevated at 913.  Vitamin B12 was 340.  Fasting lipid panel  significant for a total cholesterol of 155, triglycerides 39, HDL 79,  LDL 68.      Beatrix Fetters, M.D.  Electronically Signed     CA/MEDQ  D:  04/14/2007  T:  04/14/2007  Job:  [id number]

## 2011-04-18 NOTE — Discharge Summary (Signed)
NAMEREINER, LOEWEN                 ACCOUNT NO.:  [id number]   MEDICAL RECORD NO.:  [id number]          PATIENT TYPE:  INP   LOCATION:  5153                         FACILITY:  [facility]   PHYSICIAN:  Corinna L. Lendell Caprice, MDDATE OF BIRTH:  1961/07/03   DATE OF ADMISSION:  01/14/2005  DATE OF DISCHARGE:  01/16/2005                                 DISCHARGE SUMMARY   DIAGNOSES:  1.  Alcoholic pancreatitis with pseudocyst.  2. Alcohol abuse.   DISCHARGE MEDICATIONS:  Percocet 5 mg 1 p.o. q.4h. p.r.n. pain, 10 were  dispensed.   ACTIVITY:  Ad lib.  He may return to work on Monday.   CONDITION ON DISCHARGE:  Stable.   DIET:  Low-fat for two weeks.  He is encouraged to quit drinking and has  been referred to outpatient alcohol treatment.   CONSULTATIONS:  None.   PROCEDURE:  None.   PERTINENT LABORATORIES:  Initial lipase was 1486.  At discharge his lipase  was 111.  Complete metabolic panel unremarkable.  CBC unremarkable.   SPECIAL STUDIES IN RADIOLOGY:  Right upper quadrant ultrasound showed a left  lobe of the liver hemangioma as previous.  Common duct was again measured at  10 mm, no intrahepatic ductal dilatation and no distal common duct stone  visualized.  Interval 5.0 x 4.6 x 4.0 cm cyst in the region of the  pancreatic tail as compared to previous CAT scan in 2001.  Also was an  interval 2.2 x 2.2 x 2.2 cm hypoechoic area within the spleen.  Progressive  dilatation of the pancreatic dict previously measuring 2.3 mm and now 5.7  mm.  No gallstones or gallbladder wall thickening was seen.  CT scan of the  pancreas recommended.  CAT scan of the abdomen and pelvis showed a 6 cm  pseudocyst in the tail of the pancreas and probable pseudocyst in the  spleen.  No abscess or mass otherwise.   HISTORY AND HOSPITAL COURSE:  [name] is a pleasant, 50 year old black male  without a primary care physician who has a history of alcoholic pancreatitis  in the past who presented to the  emergency room with complaints of  epigastric pain, nausea, vomiting for several weeks.  He had been on a  binge.   On exam he had normal vital signs and had epigastric tenderness.  No mass or  distention.  He was found to have pancreatitis by enzymes and by clinical  history and exam.  The patient was admitted to the floor, made n.p.o., given  IV fluids, antiemetics and pain medications.  He has no history of DTs and  did not have any withdrawal symptoms here in the hospital.  His pain and  tenderness and Lipase improved progressively and his diet was advanced.   At the time of discharge he has no tenderness and had not had any pain  medications for almost a day and a half.  He was tolerating a solid diet.  He was encouraged to quit drinking and he agrees he also reports that he may  try outpatient alcohol treatment and have  asked the social worker to consult  for assistance with this.      CLS/MEDQ  D:  01/16/2005  T:  01/16/2005  Job:  [id number]

## 2011-04-18 NOTE — Discharge Summary (Signed)
[place]. [facility]  Patient:    [name], [name]                        MRN: [id number] Adm. Date:  [id number] Disc. Date: 08/26/00 Attending:  Cherylynn Ridges                           Discharge Summary  DISCHARGE DIAGNOSES:  Acute likely alcoholic pancreatitis and hepatitis.  PRINCIPAL PROCEDURES:  Admission for observation, repeat laboratory studies, and CT scan of the abdomen.  DISCHARGE MEDICATIONS:  Tylenol p.r.n. for pain.  He is not discharged home on any pain medicine.  SPECIAL INSTRUCTIONS:  He has been cautioned against heavy drinking.  He was intoxicated at the time this happened.  CONDITION ON DISCHARGE:  Stable.  FOLLOW-UP:  He is to follow up to see me in my office and a p.r.n. basis.  HISTORY OF PRESENT ILLNESS:  The patient is a 50 year old gentleman admitted with abdominal pain, elevated lipase, amylase, and liver function tests in the mid range.  He had been drinking alcohol.  The ultrasound did not show any stones.  However, he was admitted to our service.  HOSPITAL COURSE:  He was observed with almost complete relief of his pain after 24 hours.  We watched him and got a gastroenterology consultation. Their opinion was that they would do an ERCP in the future, but did not need to be done acutely.  CT scan demonstrated evidence of pancreatitis with swelling and edema, however, not severe.  His repeat LFTs, amylase, and lipase were normalized.  DISPOSITION:  He was discharged to home in the care of his family.  He has been cautioned against further drinking.  I think that some type of counseling has been arranged.  Alcohol rehabilitation has also been considered. DD:  08/26/00 TD:  08/26/00 Job: [id number]

## 2011-04-18 NOTE — Op Note (Signed)
[place]  Patient:    [name], [name]                        MRN: [id number] Proc. Date: 11/05/00 Adm. Date:  [id number] Attending:  Cherylynn Ridges                           Operative Report  PREOPERATIVE DIAGNOSIS:  Bilateral inguinal hernias, left side greater than right.  POSTOPERATIVE DIAGNOSIS:  Bilateral inguinal hernias, left side greater than right.  Bilateral indirect inguinal hernias.  PROCEDURE:  Bilateral laparoscopic herniorrhaphy with Marlex mesh.  SURGEON:  Jimmye Norman, M.D.  ASSISTANT:  Dr. Gerrit Friends.  ANESTHESIA:  General endotracheal.  ESTIMATED BLOOD LOSS:  50 cc.  COMPLICATIONS:  None.  CONDITION:  Stable.  SPECIMENS:  None.  INDICATIONS:  The patient is a 50 year old seen at [facility] Emergency [facility] with a left-sided inguinal pain and a lump.  Found to have a hernia and on examination by Dr. Jamey Ripa, subsequently was found to have bilateral hernias with the left side being greater in size.   He was set up for a bilateral laparoscopic herniorrhaphy.  FINDINGS:  The patient had bilateral indirect hernias.  The left side was greater than the right.  The left side had to be stripped from the spermatic cord and placed behind the mesh that was subsequently tacked down.  OPERATION:  The patient was taken to the operating room, placed on table in supine position.  After an adequate general anesthetic was administered, he was prepped and draped in usual sterile manner exposing his umbilicus and bilateral groin areas.  A left periumbilical incision was made transversely down to the anterior sheath of the rectus.  The anterior sheath was subsequently incised using an 11-blade and then the rectus muscle was split down to the posterior rectus sheath, at which point, the balloon dissector was passed down to the pubic tubercle.  We insufflated the balloon under direct vision causing good dissection laterally on both the left  and right side.  We subsequently removed the balloon and passed an Origin dissecting cannula into the preperitoneal space.  Subsequently two 5 mm cannulae were placed along the midline, one suprapubic and one sort of between the umbilicus and the suprapubic area. With these cannulae in place, the patient was placed in the Trendelenburg position and we began dissection on the left side.  Upon dissecting the left side, we could see the hernia sac coursing along with the left spermatic cord.  We were able to dissect the sac away from the cord and keep it in the preperitoneal space as we passed the mesh on top of the cord.  The sac was subsequently taken down to the external ______ to the preperitoneal surface of the mesh.  Once this was done, we went to the right side, where the sac was not nearly as prominent, we were able to strip it off the spermatic cord and then subsequently lay our mesh, which measured approximately 5 x 5 cm in size down to the preperitoneal space along the Coopers ligament, inferiorly the anterior abdominal wall and laterally we only put some anterior-superiorly.  This was done on both sides and with mesh in place.  We irrigated with saline.  It appeared to be adequate repair and then subsequently removed all cannulae.  Once the cannulae were removed, gas was removed, we repaired the periumbilical anterior fascial  area with a figure-of-eight stitch of 0 Vicryl.  Subcuticular 5-0 Vicryls were passed at all skin sites and Steri-Strips applied. DD:  11/05/00 TD:  11/05/00 Job: [id number]

## 2011-04-18 NOTE — H&P (Signed)
[place]. [facility]  Patient:    [name], [name]                          MRN: [id number] Adm. Date:  08/23/00 Attending:  Zigmund Daniel, M.D.                         History and Physical  CHIEF COMPLAINT:  Abdominal pain.  PRESENT ILLNESS:  The patient is a generally healthy 50 year old black male who has had moderate abdominal pain for about a week.  It is tremendously exacerbated during the night last night and he came to the emergency department.  He states he drank beer and wine fairly heavily yesterday.  He occasionally drinks rather heavily.  There is no history of any GI problems. His white count and hemoglobin were normal, but he had elevation of the serum amylase and lipase to the several-hundred level.  He also has moderate elevation of transaminase enzymes and a bilirubin of 2.9.  Ultrasound of the upper abdomen shows edema of the pancreas and some fluid around the head of the pancreas and the gallbladder.  The gallbladder is distended and the common bile duct is dilated to 1 cm, but no gallstones were seen.  The patient is felt to have pancreatitis, possibly related to gallstones, possibly related to alcohol ingestion and is admitted to the hospital for the care of that problem.  Since receiving pain medicine in the emergency department he is feeling considerably better.  PAST MEDICAL HISTORY:  The patient had some similar attacks of pain a few months ago.  No diagnosis of pancreatitis or gallstones has ever been made prior to this.  He denies serious chronic aliments.  MEDICATIONS:  His on medication is an acid reducing medicine, which he had been given for this problem.  PAST SURGICAL HISTORY:  His only previous operation was a operation to correct a squint in the right eye, which was unsuccessful when he was a child.  ALLERGIES:  He has no medication allergies.  SOCIAL HISTORY:  He does not smoke.  FAMILY HISTORY:  Family history  and childhood illnesses are unremarkable.  REVIEW OF SYSTEMS:  In detail beyond the above is unremarkable.  PHYSICAL EXAMINATION:  GENERAL APPEARANCE:  The patient is a generally healthy-appearing.  No acute distress.  Mental status normal.  VITAL SIGNS:  Unremarkable as recorded by the nurse.  HEAD, NECK, EYES, EARS, NOSE, AND THROAT:  Unremarkable.  The right eye deviates laterally and there is no sight in it.  HEENT is otherwise unremarkable.  CHEST:  Clear to auscultation.  HEART:  Rate and rhythm normal.  ABDOMEN:  Very mild epigastric tenderness.  Not distended.  Bowel sounds are decreased.  RECTAL:  No performed.  EXTREMITIES:  Normal.  LYMPH NODES:  Not enlarged.  SKIN:  No lesions are noted.  IMPRESSION:  Pancreatitis and jaundice.  PLAN:  Supportive care.  Pain medication.  Gastroenterology consultation. DD:  08/23/00 TD:  08/23/00 Job: [id number]

## 2011-04-30 NOTE — H&P (Signed)
NAMEPEIRCE, DEVENEY NO.:  [id number]  MEDICAL RECORD NO.:  [id number]           PATIENT TYPE:  E  LOCATION:  WLED                         FACILITY:  [facility]  PHYSICIAN:  Calvert Cantor, M.D.     DATE OF BIRTH:  March 16, 1961  DATE OF ADMISSION:  03/30/2011 DATE OF DISCHARGE:                             HISTORY & PHYSICAL   REFERRING PHYSICIAN:  Azalia Bilis, MD  PRIMARY CARE PHYSICIAN:  None.  PRESENTING COMPLAINT:  Abdominal pain.  HISTORY OF PRESENT ILLNESS:  This is a 50 year old male who comes into the hospital with abdominal pain.  He points to epigastrium when asked where the pain is.  The patient states that he had one episode of vomiting yesterday, but otherwise has not had any vomiting.  He has not eaten anything today.  He does admit to loose stools when asked about it and states that they also started yesterday.  He complains of a headache today.  When asked about fevers, he thinks he has had fever since yesterday.  In the ER, his temperature is 102.5.  The patient does not admit to any headache, stiff neck, cough or shortness of breath.  He does not complain of any dysuria, but is found to have a UTI.  He does not complain of any blood in his stool or vomitus.  Does not complain of any melena.  PAST MEDICAL HISTORY:  Alcohol abuse.  PAST SURGICAL HISTORY:  None.  SOCIAL HISTORY:  Does not smoke.  He drinks 240 ounces of beers a day. He used to use crack and marijuana, but this is about 18 years ago.  He is not working.  He lives with his girlfriend.  FAMILY HISTORY:  Mother is alive and healthy.  Father has passed away, he is unsure of what.  He is not aware of any medical problems in his family.  ALLERGIES:  No known drug allergies.  MEDICATIONS:  Does not take any.  REVIEW OF SYSTEMS:  GENERAL:  No recent weight loss, weight gain, has had fevers and fatigue.  HEENT:  No frequent headaches.  No blurred vision, double vision, sore throat,  sinus trouble or earache. RESPIRATORY:  No shortness breath or cough.  CARDIAC:  No chest pain, palpitations or pedal edema.  GI:  Per H and P.  No history of ulcers in his stomach.  GU:  No dysuria or hematuria.  HEMATOLOGIC:  No easy bruising.  SKIN:  No rash.  MUSCULOSKELETAL:  No joint pain or back pain.  NEUROLOGIC:  Complains of numbness and tingling in both of his feet.  He is able to feel his sox, shoes and able to feel the floor when he walks.  He has no trouble with ambulating.  He has never had a stroke or seizure.  PSYCHOLOGIC:  No depression or anxiety.  PHYSICAL EXAM:  GENERAL:  Middle-aged male sitting up in bed, in no acute distress. VITAL SIGNS:  Temperature 102.5, blood pressure 104/60, pulse was 124 on arrival, respiratory rate was 23, oxygen saturation 100%. HEENT:  Pupils equal, round, reactive to light.  Extraocular movements are intact.  Conjunctivae is pink.  No scleral icterus.  Oral mucosa moist. NECK:  Supple.  No thyromegaly, lymphadenopathy, carotid bruits. HEART:  Regular rate and rhythm.  No murmurs, rubs or gallops. LUNGS:  Clear bilaterally.  Normal respiratory effort.  No use of accessory muscles. ABDOMEN:  Mildly tender in the epigastrium.  He has a hernia just above his umbilicus, which is small.  It is nontender and can only be seen when he puts pressure on his abdomen and coughs.  Bowel sounds are positive.  No other tenderness noted in the abdomen. EXTREMITIES:  No cyanosis, clubbing or edema.  Pedal pulses positive. NEUROLOGIC:  Cranial nerves II-XII intact.  Strength intact in all four extremities.  Fine tremor when he extends his hands. PSYCHOLOGIC:  Awake, alert, oriented x3.  Mood and affect normal. SKIN:  Warm, dry.  No rash or bruising.  LABORATORY DATA:  Blood work, WBC count is slightly low at 3.0, hemoglobin 11.1, hematocrit 32.7, MCV is slightly elevated at 100.9, platelets are normal at 131.  Metabolic panel is within normal  limits.  Fecal occult blood is negative.  Lipase is 80.  UA is cloudy with moderate hemoglobin, moderate leukocyte esterase, many bacteria and too numerous to count WBCs.  CT of the abdomen and pelvis does not reveal any pancreatic necrosis, abscess or pseudocyst.  There is mild atelectasis at the lung bases. There is hepatic steatosis.  There is extensive portosystemic shunting in the upper abdomen and a diminutive left portal vein.  There is hypervascular lesion in the left hepatic lobe, may be flash feeling of a hemangioma or vascular malformation.  There is a new small hypodensity in the right hepatic lobe, technically nonspecific although statistically likely to be benign.  There is stable hypodensity in the lateral segment of the left hepatic lobe considered benign.  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  There is diffuse gallbladder wall thickening, possibly from hypoproteinemia although cholecystitis cannot be confidently excluded.  There is edema at the root of the mesentery in porta hepatis.  Ultrasound of the abdomen reveals thickening edematous gallbladder without pain over the gallbladder and no gallstones, diffuse fatty liver, pancreas is obscured.  ASSESSMENT AND PLAN: 1. Urinary tract infection and sepsis.  We will place him on Levaquin     250 mg daily.  Follow-up urine culture. 2. Epigastric pain.  I suspect this is likely gastritis.  Lipase is     negative.  The surgeon has been consulted to rule out     cholecystitis. 3. 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 5. Edematous gallbladder, see above.  Surgery eval has been requested.     The patient is nontender over the right upper quadrant. 6. Complaint of numbness in his feet.  We will check a vitamin B12     level, but this is likely from alcohol abuse. 7. The patient also complains of "jock itch".  We will place him on     antifungal. 8. The patient would like to be a do  not intubate, but does want CPR. 9. DVT prophylaxis with Lovenox.  He will be on a clear liquid diet     for now since he has been vomiting, this can be advanced as     tolerated.  For now, we will hydrate him with D5 normal saline at     125 mL an hour.  Because of his alcohol abuse, he will also be     placed on  a CIWA scale.  Time on admission was 50 minutes.     Calvert Cantor, M.D.     SR/MEDQ  D:  03/30/2011  T:  03/30/2011  Job:  [id number]  Electronically Signed by Calvert Cantor M.D. on 04/30/2011 09:19:08 AM

## 2012-08-20 IMAGING — CT CT CERVICAL SPINE W/O CM
3 of 8 series · 10 of 33 positions shown, 12 images · non-contrast
Comparison: Prior study 05/13/2010.

CT HEAD

CLINICAL DATA: Fell.  Hit head.

CT HEAD WITHOUT CONTRAST
CT CERVICAL SPINE WITHOUT CONTRAST
TECHNIQUE: Multidetector CT imaging of the head and cervical spine
was performed following the standard protocol without intravenous
contrast.  Multiplanar CT image reconstructions of the cervical
spine were also generated.

[Series 4: c_spine 2.0 b31s · axial · 0.29mm/px · z∈[-262,-206]mm · 2 of 84 slices shown, 3 images]
[im 28/84  soft-tissue]
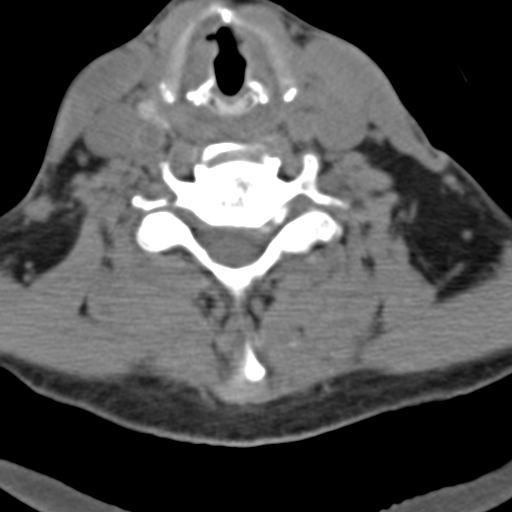
[im 28/84  bone]
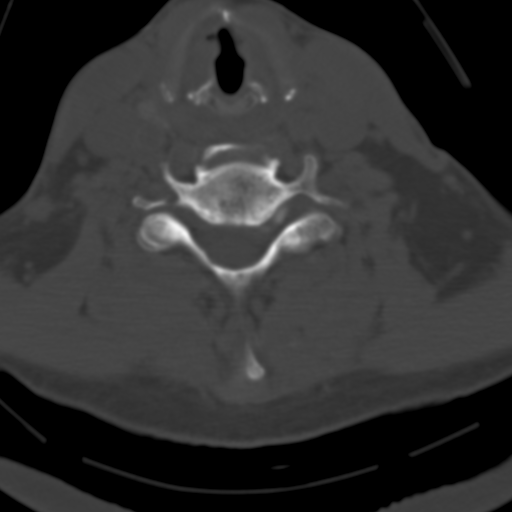
[im 56/84  bone]
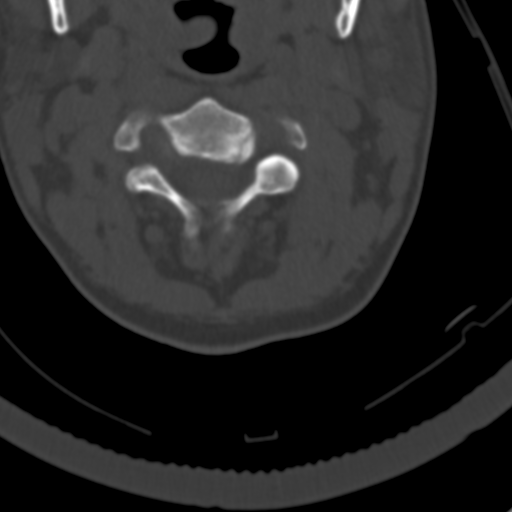

[Series 603: coronal · coronal · 0.33mm/px · 3 of 41 slices shown]
[im 9/41  bone]
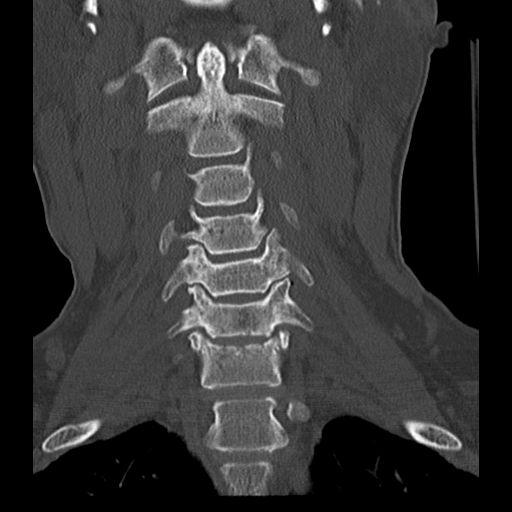
[im 17/41  bone]
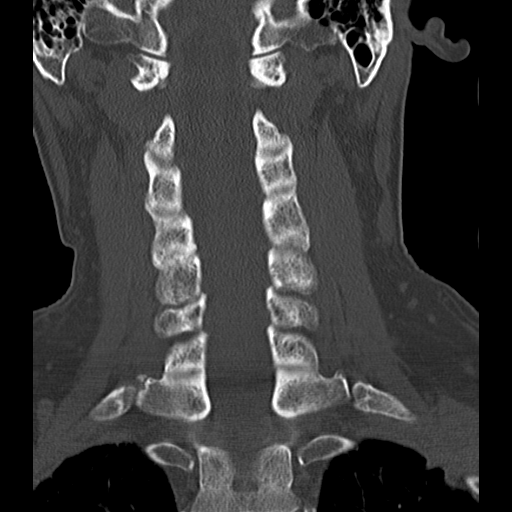
[im 25/41  bone]
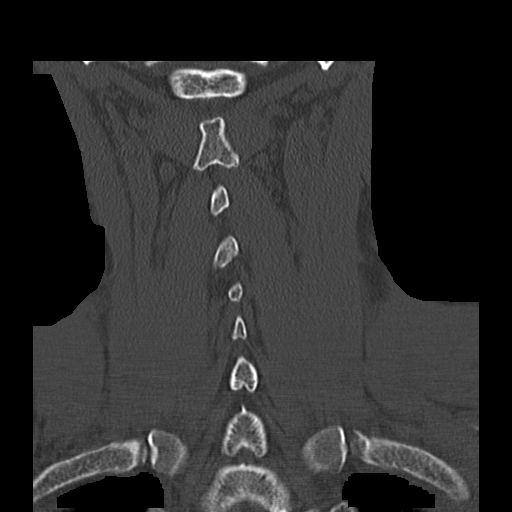

[Series 604: sagittal · sagittal · 0.33mm/px · 5 of 53 slices shown, 6 images]
[im 18/53  bone]
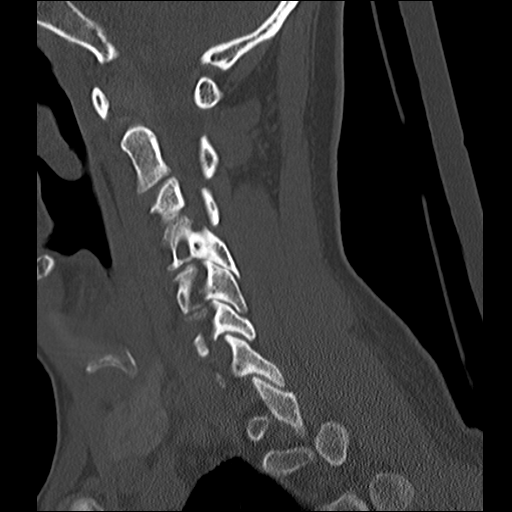
[im 22/53  bone]
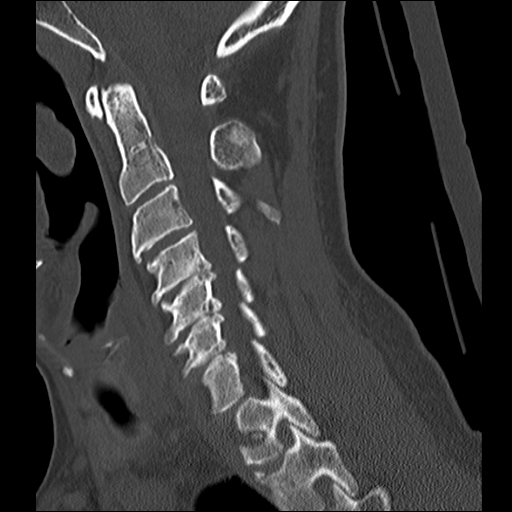
[im 27/53  soft-tissue]
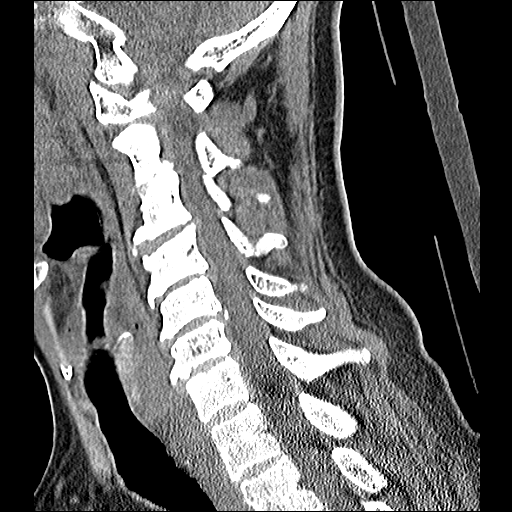
[im 27/53  bone]
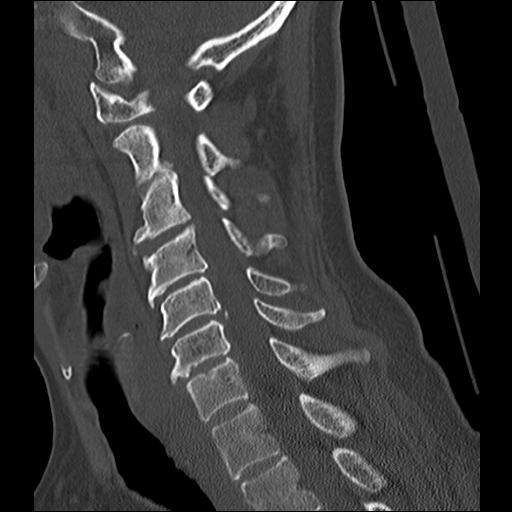
[im 31/53  bone]
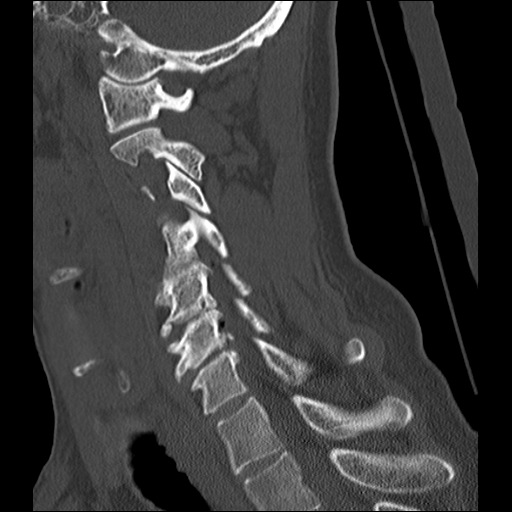
[im 35/53  bone]
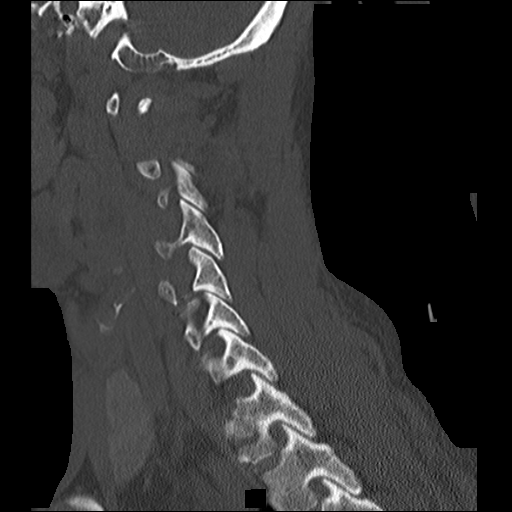

[10 of 33 positions shown; findings below may reference images not displayed]

FINDINGS: Stable advanced cerebral atrophy, ventriculomegaly and
periventricular white matter disease for age.  No CT findings for
acute intracranial process such as hemispheric infarction or
intracranial hemorrhage.  No extra-axial fluid collections are
identified.  No mass lesions.  The brainstem and cerebellum are
grossly normal and unchanged.

The bony calvarium is intact.  No acute fracture.  The paranasal
sinuses and mastoid air cells are clear.  Globes are intact.
IMPRESSION: 1.  Stable age advanced cerebral atrophy, ventriculomegaly and
periventricular white matter disease.
2.  No acute intracranial abnormality.
3.  No acute skull fracture.

CT CERVICAL SPINE
FINDINGS: The sagittal reformatted images demonstrate normal
alignment of the cervical vertebral bodies.  Disc spaces and
vertebral bodies are fairly well maintained.  Moderate degenerative
cervical spondylosis with disc disease and facet disease.  No acute
bony findings or abnormal prevertebral soft tissue swelling.

The facets are normally aligned.  No facet or laminar fractures are
seen. No large disc protrusions.  The neural foramen are patent.

The skull base C1 and C1-C2 articulations are maintained.  The dens
is normal.

There are scattered cervical lymph nodes.  The lung apices are
clear.
IMPRESSION: 1.  Degenerative cervical spondylosis with disc disease and facet
disease.
2.  No acute fracture.

## 2012-09-06 IMAGING — CT CT HEAD W/O CM
1 series · 16 of 30 positions shown, 20 images · non-contrast
Comparison: CT 03/01/2011.

CLINICAL DATA: Medical clearance.  Alcohol, confusion

CT HEAD WITHOUT CONTRAST
TECHNIQUE: Contiguous axial images were obtained from the base of
the skull through the vertex without contrast.

[Series 2: head_seq 4.5 h37s st · axial · 0.43mm/px · z∈[-215,-71]mm · 16 of 36 slices shown, 20 images]
[im 2/36  brain]
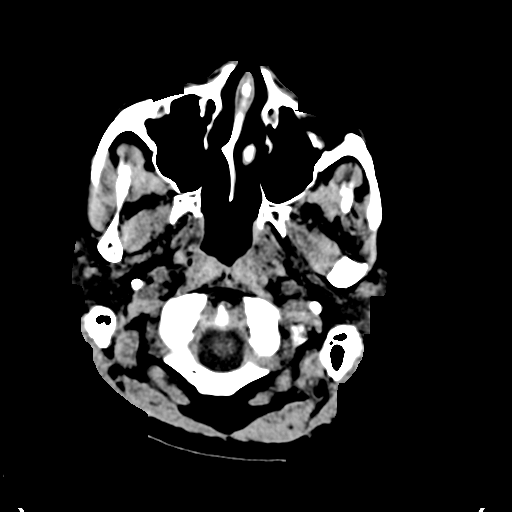
[im 2/36  bone]
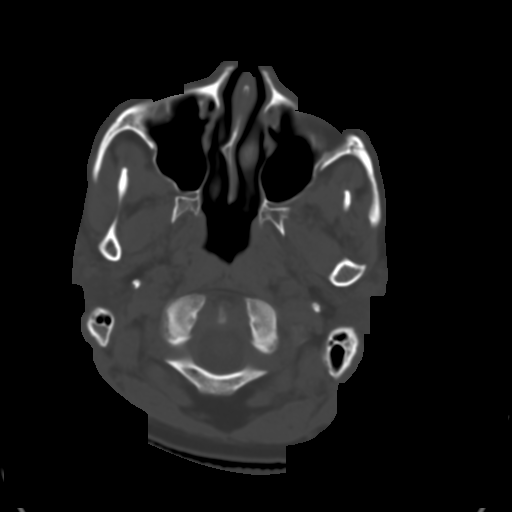
[im 4/36  brain]
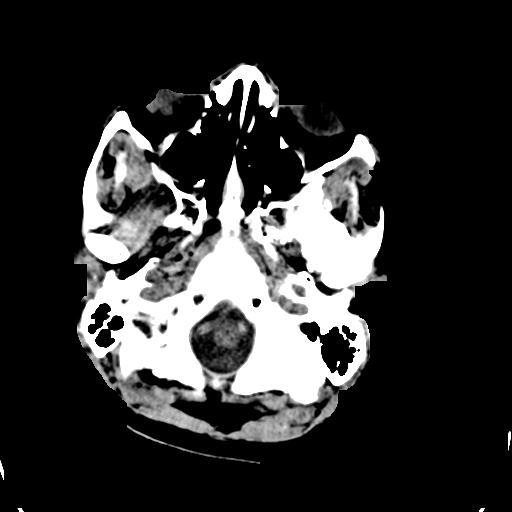
[im 7/36  brain]
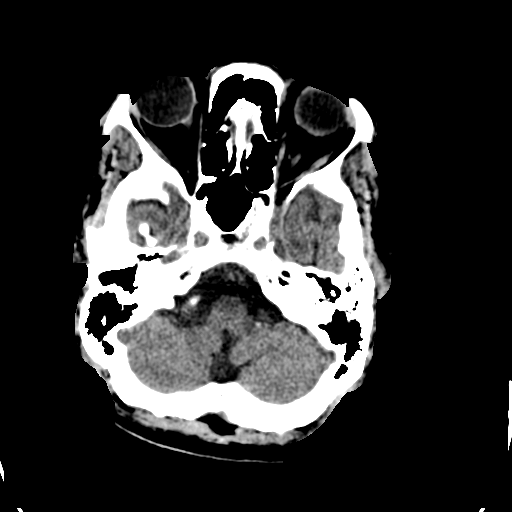
[im 9/36  brain]
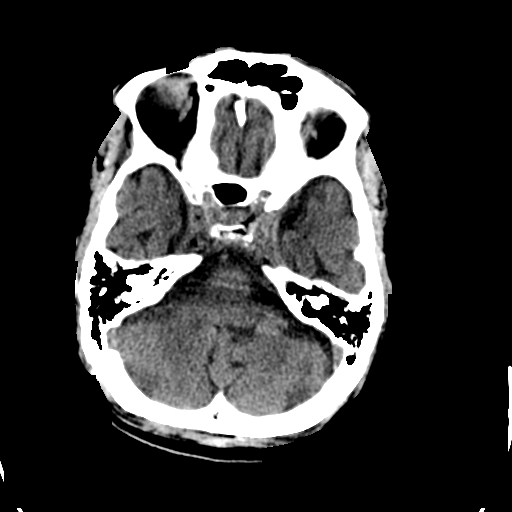
[im 10/36  brain]
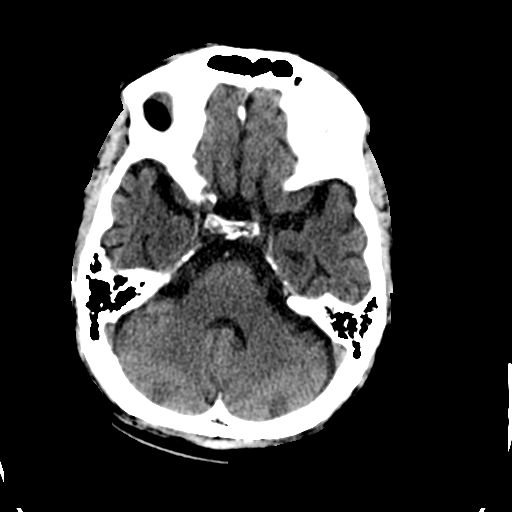
[im 10/36  bone]
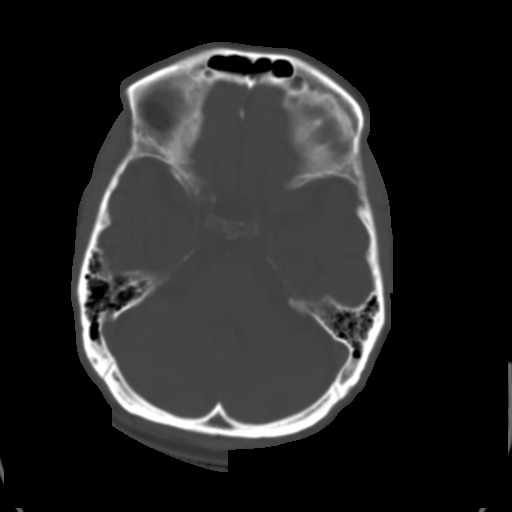
[im 13/36  brain]
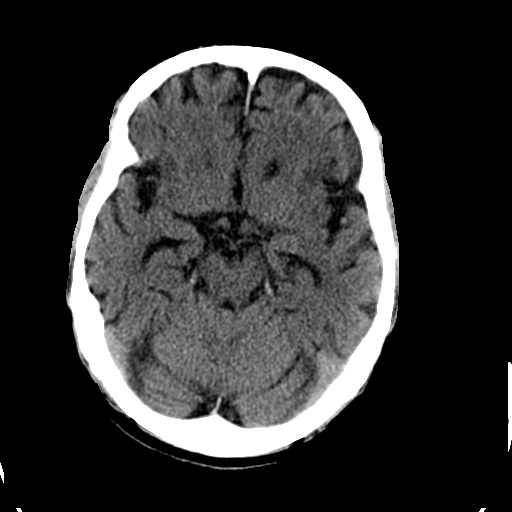
[im 15/36  brain]
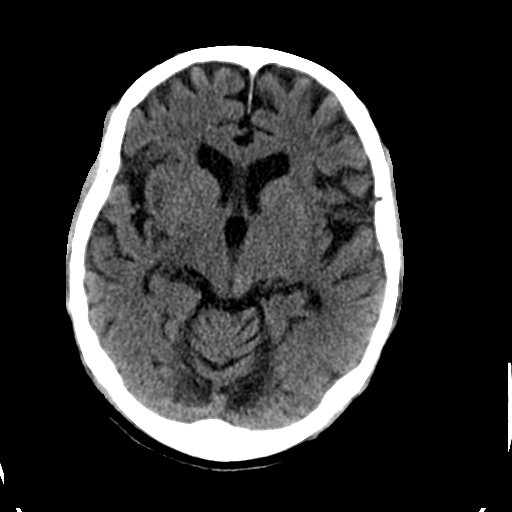
[im 17/36  brain]
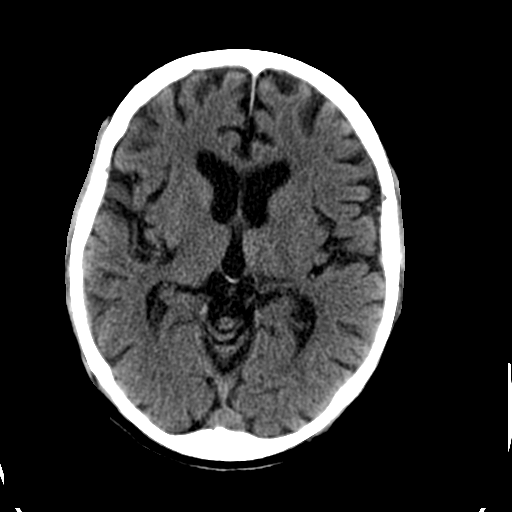
[im 19/36  brain]
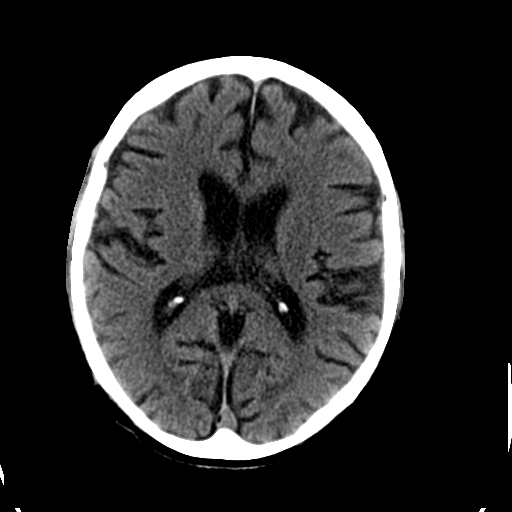
[im 19/36  bone]
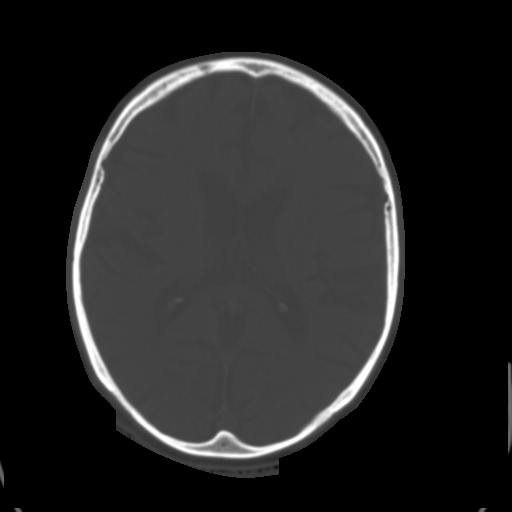
[im 21/36  brain]
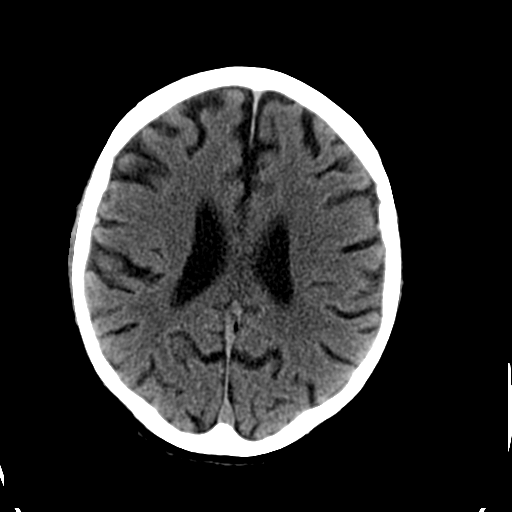
[im 23/36  brain]
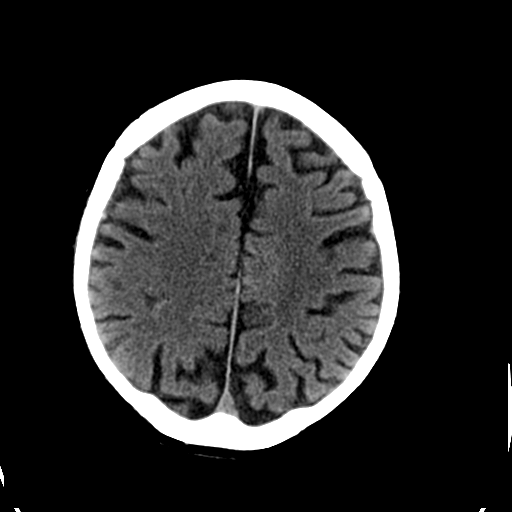
[im 26/36  brain]
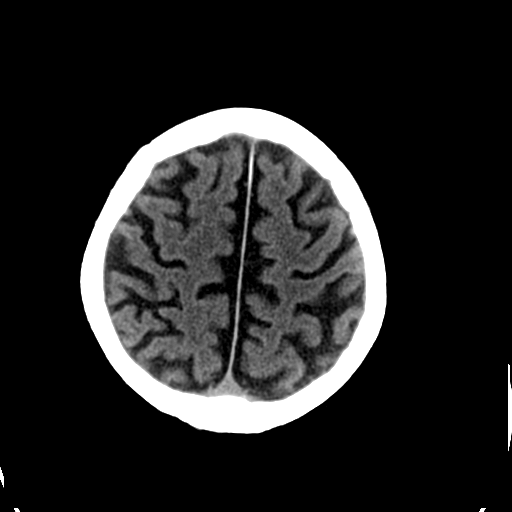
[im 27/36  brain]
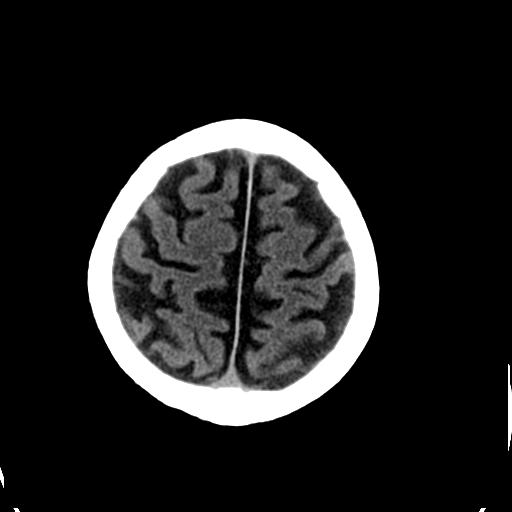
[im 27/36  bone]
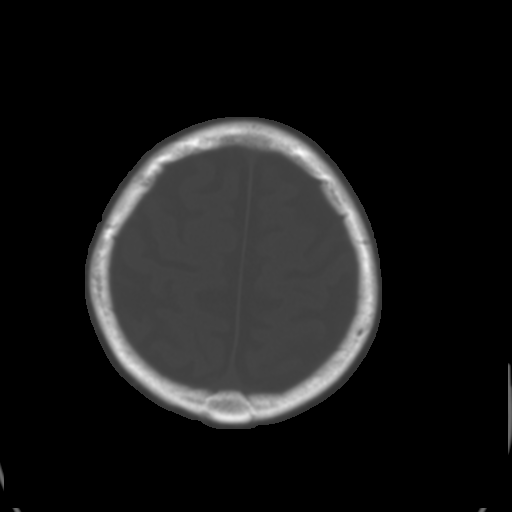
[im 29/36  brain]
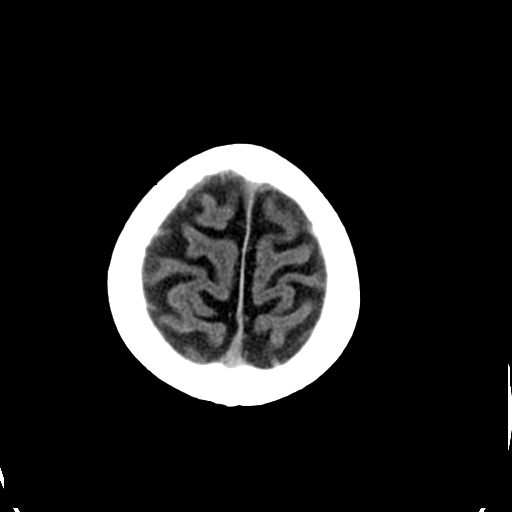
[im 32/36  brain]
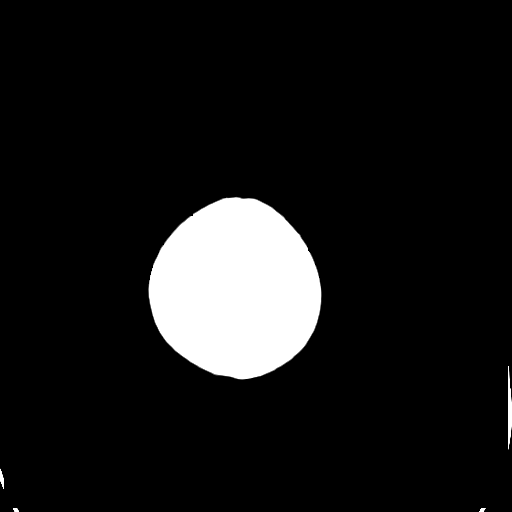
[im 34/36  brain]
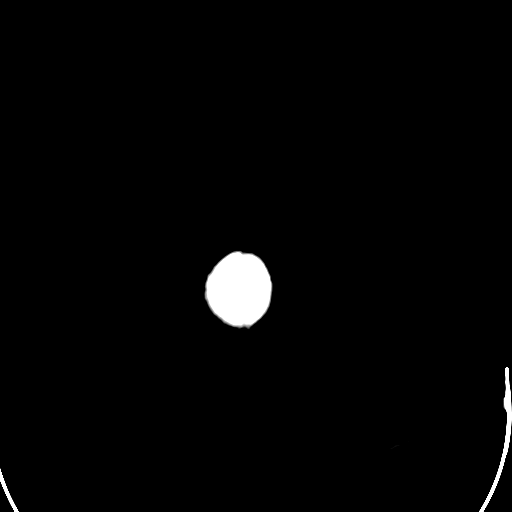

[16 of 30 positions shown; findings below may reference images not displayed]

FINDINGS: Advanced atrophy for age.  Negative for hemorrhage.
Negative for acute infarct or mass lesion.  The calvarium is
intact.
IMPRESSION: Advanced atrophy.  No acute abnormality.

## 2012-09-21 ENCOUNTER — Encounter (HOSPITAL_COMMUNITY): Payer: Self-pay | Admitting: *Deleted

## 2012-09-21 ENCOUNTER — Emergency Department (HOSPITAL_COMMUNITY)
Admission: EM | Admit: 2012-09-21 | Discharge: 2012-09-21 | Disposition: A | Discharge status: Home / Self Care | Payer: Self-pay | Attending: Emergency Medicine | Admitting: Emergency Medicine

## 2012-09-21 DIAGNOSIS — B86 Scabies: Secondary | ICD-10-CM | POA: Insufficient documentation

## 2012-09-21 DIAGNOSIS — Z79899 Other long term (current) drug therapy: Secondary | ICD-10-CM | POA: Insufficient documentation

## 2012-09-21 MED ORDER — PERMETHRIN 5 % EX CREA: TOPICAL_CREAM | CUTANEOUS | Status: DC

## 2012-09-21 NOTE — ED Provider Notes (Signed)
History     CSN: [id number]  Arrival date & time 09/21/12  1955   First MD Initiated Contact with Patient 09/21/12 2124      Chief Complaint  Patient presents with  . Rash    (Consider location/radiation/quality/duration/timing/severity/associated sxs/prior treatment) HPI Comments: Patient generalized.  Rash between his fingers in the groin, and in the heat CT areas and skin folds.  He, states it started approximately one month ago.  His significant other girlfriend has a similar rash.  She was treated approximately one month ago, with a shot of steroid, but this did not help the rash, he denies any shortness of breath, chest pressure, difficulty swallowing, fevers, myalgias  Patient is a 51 y.o. male presenting with rash. The history is provided by the patient.  Rash  This is a new problem. The current episode started more than 1 week ago. The problem has been gradually worsening. The problem is associated with nothing.    History reviewed. No pertinent past medical history.  Past Surgical History  Procedure Date  . Hernia repair     History reviewed. No pertinent family history.  History  Substance Use Topics  . Smoking status: Never Smoker   . Smokeless tobacco: Not on file  . Alcohol Use: 4.2 oz/week    7 Cans of beer per week      Review of Systems  Constitutional: Negative for fever and chills.  Musculoskeletal: Negative for myalgias and joint swelling.  Skin: Positive for rash. Negative for wound.  Neurological: Negative for dizziness and headaches.    Allergies  Review of patient's allergies indicates no known allergies.  Home Medications   Current Outpatient Rx  Name Route Sig Dispense Refill  . PERMETHRIN 5 % EX CREA  Apply to affected area once leave on for 8-12 hour than wash off repeat in 2 weeks 60 g 0    BP 136/89  Pulse 86  Temp 98.7 F (37.1 C) (Oral)  Resp 20  SpO2 97%  Physical Exam  Constitutional: He is oriented to person, place,  and time. He appears well-developed.  Neck: Normal range of motion.  Cardiovascular: Normal rate.   Musculoskeletal: He exhibits no edema and no tenderness.  Neurological: He is alert and oriented to person, place, and time.  Skin: Rash noted.       Rash between finger webs, groin folds.  Belt line with diffuse scattering is over.  Remainder of the trunk, abdomen, chest, arms, and lower extremities    ED Course  Procedures (including critical care time)  Labs Reviewed - No data to display No results found.   1. Scabies infestation       MDM  Will treat with Elimite and encouraged him to have his entire family treated at the same time.  To avoid reinfestation         Arman Filter, NP 09/21/12 2139  Arman Filter, NP 09/21/12 2140

## 2012-09-21 NOTE — ED Notes (Signed)
Pt reports rash all over body x 4 weeks.  Rash is mainly on legs and groin.  Pt reports itching.  Denies new detergent/lotion.

## 2012-09-23 NOTE — ED Provider Notes (Signed)
Medical screening examination/treatment/procedure(s) were performed by non-physician practitioner and as supervising physician I was immediately available for consultation/collaboration.   Gwyneth Sprout, MD 09/23/12 1326

## 2017-04-04 ENCOUNTER — Inpatient Hospital Stay (HOSPITAL_COMMUNITY)
Admission: EM | Admit: 2017-04-04 | Discharge: 2017-04-09 | DRG: 865 | Discharge status: Against Medical Advice | Payer: Self-pay | Attending: Student in an Organized Health Care Education/Training Program | Admitting: Student in an Organized Health Care Education/Training Program

## 2017-04-04 ENCOUNTER — Emergency Department (HOSPITAL_COMMUNITY): Payer: Self-pay

## 2017-04-04 ENCOUNTER — Inpatient Hospital Stay (HOSPITAL_COMMUNITY): Payer: Self-pay

## 2017-04-04 ENCOUNTER — Encounter (HOSPITAL_COMMUNITY): Payer: Self-pay | Admitting: Emergency Medicine

## 2017-04-04 DIAGNOSIS — N179 Acute kidney failure, unspecified: Secondary | ICD-10-CM | POA: Diagnosis present

## 2017-04-04 DIAGNOSIS — K766 Portal hypertension: Secondary | ICD-10-CM | POA: Diagnosis present

## 2017-04-04 DIAGNOSIS — E878 Other disorders of electrolyte and fluid balance, not elsewhere classified: Secondary | ICD-10-CM | POA: Diagnosis present

## 2017-04-04 DIAGNOSIS — A938 Other specified arthropod-borne viral fevers: Principal | ICD-10-CM | POA: Diagnosis present

## 2017-04-04 DIAGNOSIS — D696 Thrombocytopenia, unspecified: Secondary | ICD-10-CM

## 2017-04-04 DIAGNOSIS — T730XXA Starvation, initial encounter: Secondary | ICD-10-CM | POA: Diagnosis present

## 2017-04-04 DIAGNOSIS — K7030 Alcoholic cirrhosis of liver without ascites: Secondary | ICD-10-CM | POA: Diagnosis present

## 2017-04-04 DIAGNOSIS — R75 Inconclusive laboratory evidence of human immunodeficiency virus [HIV]: Secondary | ICD-10-CM | POA: Diagnosis present

## 2017-04-04 DIAGNOSIS — X58XXXA Exposure to other specified factors, initial encounter: Secondary | ICD-10-CM | POA: Diagnosis present

## 2017-04-04 DIAGNOSIS — E46 Unspecified protein-calorie malnutrition: Secondary | ICD-10-CM | POA: Diagnosis present

## 2017-04-04 DIAGNOSIS — E871 Hypo-osmolality and hyponatremia: Secondary | ICD-10-CM | POA: Diagnosis present

## 2017-04-04 DIAGNOSIS — E8729 Other acidosis: Secondary | ICD-10-CM

## 2017-04-04 DIAGNOSIS — W57XXXA Bitten or stung by nonvenomous insect and other nonvenomous arthropods, initial encounter: Secondary | ICD-10-CM | POA: Diagnosis present

## 2017-04-04 DIAGNOSIS — K7290 Hepatic failure, unspecified without coma: Secondary | ICD-10-CM | POA: Diagnosis present

## 2017-04-04 DIAGNOSIS — E872 Acidosis, unspecified: Secondary | ICD-10-CM

## 2017-04-04 DIAGNOSIS — G9340 Encephalopathy, unspecified: Secondary | ICD-10-CM | POA: Diagnosis present

## 2017-04-04 DIAGNOSIS — F1020 Alcohol dependence, uncomplicated: Secondary | ICD-10-CM | POA: Diagnosis present

## 2017-04-04 DIAGNOSIS — D6959 Other secondary thrombocytopenia: Secondary | ICD-10-CM | POA: Diagnosis present

## 2017-04-04 DIAGNOSIS — F1010 Alcohol abuse, uncomplicated: Secondary | ICD-10-CM | POA: Diagnosis present

## 2017-04-04 DIAGNOSIS — R740 Nonspecific elevation of levels of transaminase and lactic acid dehydrogenase [LDH]: Secondary | ICD-10-CM | POA: Diagnosis present

## 2017-04-04 DIAGNOSIS — K7010 Alcoholic hepatitis without ascites: Secondary | ICD-10-CM | POA: Diagnosis present

## 2017-04-04 DIAGNOSIS — E876 Hypokalemia: Secondary | ICD-10-CM | POA: Diagnosis present

## 2017-04-04 DIAGNOSIS — B882 Other arthropod infestations: Secondary | ICD-10-CM | POA: Diagnosis present

## 2017-04-04 DIAGNOSIS — Z6820 Body mass index (BMI) 20.0-20.9, adult: Secondary | ICD-10-CM

## 2017-04-04 DIAGNOSIS — R1011 Right upper quadrant pain: Secondary | ICD-10-CM

## 2017-04-04 DIAGNOSIS — T364X5A Adverse effect of tetracyclines, initial encounter: Secondary | ICD-10-CM | POA: Diagnosis not present

## 2017-04-04 DIAGNOSIS — R7989 Other specified abnormal findings of blood chemistry: Secondary | ICD-10-CM | POA: Diagnosis present

## 2017-04-04 DIAGNOSIS — Y92230 Patient room in hospital as the place of occurrence of the external cause: Secondary | ICD-10-CM | POA: Diagnosis not present

## 2017-04-04 DIAGNOSIS — E861 Hypovolemia: Secondary | ICD-10-CM | POA: Diagnosis present

## 2017-04-04 DIAGNOSIS — K760 Fatty (change of) liver, not elsewhere classified: Secondary | ICD-10-CM | POA: Diagnosis present

## 2017-04-04 DIAGNOSIS — D72829 Elevated white blood cell count, unspecified: Secondary | ICD-10-CM | POA: Diagnosis present

## 2017-04-04 DIAGNOSIS — K861 Other chronic pancreatitis: Secondary | ICD-10-CM | POA: Diagnosis present

## 2017-04-04 DIAGNOSIS — E8809 Other disorders of plasma-protein metabolism, not elsewhere classified: Secondary | ICD-10-CM | POA: Diagnosis present

## 2017-04-04 DIAGNOSIS — E860 Dehydration: Secondary | ICD-10-CM | POA: Diagnosis present

## 2017-04-04 DIAGNOSIS — R7401 Elevation of levels of liver transaminase levels: Secondary | ICD-10-CM | POA: Diagnosis present

## 2017-04-04 DIAGNOSIS — R197 Diarrhea, unspecified: Secondary | ICD-10-CM | POA: Diagnosis present

## 2017-04-04 LAB — CBC
HCT: 42.3 % (ref 39.0–52.0)
HEMATOCRIT: 48.1 % (ref 39.0–52.0)
HEMOGLOBIN: 17.2 g/dL — AB (ref 13.0–17.0)
HEMOGLOBIN: 15.3 g/dL (ref 13.0–17.0)
MCH: 34.1 pg — AB (ref 26.0–34.0)
MCH: 33.9 pg (ref 26.0–34.0)
MCHC: 35.8 g/dL (ref 30.0–36.0)
MCHC: 36.2 g/dL — ABNORMAL HIGH (ref 30.0–36.0)
MCV: 95.2 fL (ref 78.0–100.0)
MCV: 93.8 fL (ref 78.0–100.0)
PLATELETS: 46 10*3/uL — AB (ref 150–400)
Platelets: 61 10*3/uL — ABNORMAL LOW (ref 150–400)
RBC: 5.05 MIL/uL (ref 4.22–5.81)
RBC: 4.51 MIL/uL (ref 4.22–5.81)
RDW: 13.7 % (ref 11.5–15.5)
RDW: 13.5 % (ref 11.5–15.5)
WBC: 7.6 10*3/uL (ref 4.0–10.5)
WBC: 5.6 10*3/uL (ref 4.0–10.5)

## 2017-04-04 LAB — COMPREHENSIVE METABOLIC PANEL
ALBUMIN: 2.7 g/dL — AB (ref 3.5–5.0)
ALT: 116 U/L — ABNORMAL HIGH (ref 17–63)
ANION GAP: 18 — AB (ref 5–15)
AST: 637 U/L — AB (ref 15–41)
Alkaline Phosphatase: 97 U/L (ref 38–126)
BUN: 27 mg/dL — ABNORMAL HIGH (ref 6–20)
CHLORIDE: 97 mmol/L — AB (ref 101–111)
CO2: 13 mmol/L — AB (ref 22–32)
Calcium: 8 mg/dL — ABNORMAL LOW (ref 8.9–10.3)
Creatinine, Ser: 2.62 mg/dL — ABNORMAL HIGH (ref 0.61–1.24)
GFR calc Af Amer: 30 mL/min — ABNORMAL LOW (ref >60)
GFR calc non Af Amer: 26 mL/min — ABNORMAL LOW (ref >60)
GLUCOSE: 194 mg/dL — AB (ref 65–99)
POTASSIUM: 3.3 mmol/L — AB (ref 3.5–5.1)
SODIUM: 128 mmol/L — AB (ref 135–145)
TOTAL PROTEIN: 6.3 g/dL — AB (ref 6.5–8.1)
Total Bilirubin: 0.8 mg/dL (ref 0.3–1.2)

## 2017-04-04 LAB — BASIC METABOLIC PANEL
ANION GAP: 10 (ref 5–15)
Anion gap: 11 (ref 5–15)
BUN: 26 mg/dL — ABNORMAL HIGH (ref 6–20)
BUN: 27 mg/dL — AB (ref 6–20)
CALCIUM: 7.1 mg/dL — AB (ref 8.9–10.3)
CALCIUM: 7.5 mg/dL — AB (ref 8.9–10.3)
CO2: 17 mmol/L — ABNORMAL LOW (ref 22–32)
CO2: 16 mmol/L — ABNORMAL LOW (ref 22–32)
CREATININE: 1.92 mg/dL — AB (ref 0.61–1.24)
Chloride: 100 mmol/L — ABNORMAL LOW (ref 101–111)
Chloride: 105 mmol/L (ref 101–111)
Creatinine, Ser: 2.25 mg/dL — ABNORMAL HIGH (ref 0.61–1.24)
GFR calc Af Amer: 36 mL/min — ABNORMAL LOW (ref >60)
GFR calc Af Amer: 44 mL/min — ABNORMAL LOW (ref >60)
GFR calc non Af Amer: 38 mL/min — ABNORMAL LOW (ref >60)
GFR, EST NON AFRICAN AMERICAN: 31 mL/min — AB (ref >60)
Glucose, Bld: 127 mg/dL — ABNORMAL HIGH (ref 65–99)
Glucose, Bld: 113 mg/dL — ABNORMAL HIGH (ref 65–99)
POTASSIUM: 2.9 mmol/L — AB (ref 3.5–5.1)
Potassium: 3.4 mmol/L — ABNORMAL LOW (ref 3.5–5.1)
Sodium: 128 mmol/L — ABNORMAL LOW (ref 135–145)
Sodium: 131 mmol/L — ABNORMAL LOW (ref 135–145)

## 2017-04-04 LAB — ACETAMINOPHEN LEVEL: Acetaminophen (Tylenol), Serum: <10 ug/mL — ABNORMAL LOW (ref 10–30)

## 2017-04-04 LAB — I-STAT CG4 LACTIC ACID, ED
LACTIC ACID, VENOUS: 2.11 mmol/L — AB (ref 0.5–1.9)
Lactic Acid, Venous: 5.09 mmol/L (ref 0.5–1.9)

## 2017-04-04 LAB — MAGNESIUM: Magnesium: 2.3 mg/dL (ref 1.7–2.4)

## 2017-04-04 LAB — LACTIC ACID, PLASMA
Lactic Acid, Venous: 2.1 mmol/L (ref 0.5–1.9)
Lactic Acid, Venous: 2 mmol/L (ref 0.5–1.9)

## 2017-04-04 LAB — PROTIME-INR
INR: 1.39
Prothrombin Time: 17.2 seconds — ABNORMAL HIGH (ref 11.4–15.2)

## 2017-04-04 LAB — PHOSPHORUS: PHOSPHORUS: 4.2 mg/dL (ref 2.5–4.6)

## 2017-04-04 LAB — AMMONIA: Ammonia: 44 umol/L — ABNORMAL HIGH (ref 9–35)

## 2017-04-04 MED ORDER — THIAMINE HCL 100 MG/ML IJ SOLN: 100.0000 mg | Freq: Every day | INTRAMUSCULAR | Status: DC

## 2017-04-04 MED ORDER — SODIUM CHLORIDE 0.9 % IV BOLUS (SEPSIS)
1000.0000 mL | Freq: Once | INTRAVENOUS | Status: DC
Start: 2017-04-04 — End: 2017-04-04

## 2017-04-04 MED ORDER — ONDANSETRON HCL 4 MG/2ML IJ SOLN: 4.0000 mg | Freq: Four times a day (QID) | INTRAMUSCULAR | Status: DC | PRN

## 2017-04-04 MED ORDER — SODIUM CHLORIDE 0.9% FLUSH
3.0000 mL | Freq: Two times a day (BID) | INTRAVENOUS | Status: DC
  Administered 2017-04-04 – 2017-04-09 (×10): 3 mL via INTRAVENOUS

## 2017-04-04 MED ORDER — THIAMINE HCL 100 MG/ML IJ SOLN
100.0000 mg | Freq: Once | INTRAMUSCULAR | Status: AC
  Administered 2017-04-04: 100 mg via INTRAVENOUS
  Filled 2017-04-04: qty 2

## 2017-04-04 MED ORDER — VITAMIN B-1 100 MG PO TABS
100.0000 mg | ORAL_TABLET | Freq: Every day | ORAL | Status: DC
  Administered 2017-04-05 – 2017-04-09 (×5): 100 mg via ORAL
  Filled 2017-04-04 (×5): qty 1

## 2017-04-04 MED ORDER — POTASSIUM CHLORIDE CRYS ER 20 MEQ PO TBCR
40.0000 meq | EXTENDED_RELEASE_TABLET | Freq: Once | ORAL | Status: AC
  Administered 2017-04-04: 40 meq via ORAL
  Filled 2017-04-04: qty 2

## 2017-04-04 MED ORDER — FOLIC ACID 5 MG/ML IJ SOLN
1.0000 mg | Freq: Every day | INTRAMUSCULAR | Status: DC
  Filled 2017-04-04: qty 0.2

## 2017-04-04 MED ORDER — ONDANSETRON HCL 4 MG PO TABS: 4.0000 mg | ORAL_TABLET | Freq: Four times a day (QID) | ORAL | Status: DC | PRN

## 2017-04-04 MED ORDER — ADULT MULTIVITAMIN W/MINERALS CH
1.0000 | ORAL_TABLET | Freq: Every day | ORAL | Status: DC
Start: 2017-04-04 — End: 2017-04-09
  Administered 2017-04-04 – 2017-04-09 (×6): 1 via ORAL
  Filled 2017-04-04 (×5): qty 1

## 2017-04-04 MED ORDER — SODIUM CHLORIDE 0.9 % IV BOLUS (SEPSIS)
1000.0000 mL | Freq: Once | INTRAVENOUS | Status: AC
  Administered 2017-04-04: 1000 mL via INTRAVENOUS

## 2017-04-04 MED ORDER — SODIUM CHLORIDE 0.9 % IV BOLUS (SEPSIS)
250.0000 mL | Freq: Once | INTRAVENOUS | Status: AC
  Administered 2017-04-04: 250 mL via INTRAVENOUS

## 2017-04-04 MED ORDER — FOLIC ACID 1 MG PO TABS
1.0000 mg | ORAL_TABLET | Freq: Every day | ORAL | Status: DC
  Administered 2017-04-04 – 2017-04-09 (×6): 1 mg via ORAL
  Filled 2017-04-04 (×6): qty 1

## 2017-04-04 MED ORDER — POTASSIUM CHLORIDE 10 MEQ/100ML IV SOLN
10.0000 meq | INTRAVENOUS | Status: AC
  Administered 2017-04-04 (×2): 10 meq via INTRAVENOUS
  Filled 2017-04-04 (×2): qty 100

## 2017-04-04 NOTE — H&P (Signed)
Date: 04/04/2017               Patient Name:  [name] MRN: [id number]  DOB: Jul 31, 1961 Age / Sex: 56 y.o., male   PCP: System, Provider Not In              Medical Service: Internal Medicine Teaching Service              Attending Physician: Dr. Rogelia Boga, Austin Miles, MD    First Contact: Lindie Spruce, MS 3 Pager: [phone]  Second Contact: Dr. Samuella Cota Pager: [phone]  Third Contact Dr. Dimple Casey Pager: [phone]       After Hours (After 5p/  First Contact Pager: [phone]  weekends / holidays): Second Contact Pager: [phone]   Chief Complaint: Week of diarrhea that stopped yesterday   History of Present Illness: [name] is a 56 year old male who has not seen a medical provider for a number of years. He presented today with a week long history of diarrhea that he said resolved yesterday. He came to the hospital today on the prompting of his girlfriend, who was concerned about his symptoms over the last week. He stated that starting last Wednesday he began having diarrhea about two times an hour. He mentioned that over the last week he has not had much to eat due to the diarrhea, but was able to drink water. He also stated that he is feeling hungry now and would be ready to eat something. He denied blood in the diarrhea and stated that the stool was mostly clear with nothing that looked like mucus. He denied sick contacts, recent travel, changes in diet, dizziness, fevers, nausea and vomiting or abdominal pain. He did mention that he has had to urinate less frequently than usual, but last urinated two hours ago. Mr. Roker denied changes in the color of his urine, blood in his urine or urine that is frothy. The patient has a significant history of alcohol use and attested to drinking about 60 Oz of malt liquor after work every day, but has not had anything to drink since the onset of his diarrhea. He stated that he has not had any prior episodes of alcohol withdrawal.   Meds: Current  Facility-Administered Medications  Medication Dose Route Frequency Provider Last Rate Last Dose  . folic acid injection 1 mg  1 mg Intravenous Daily Rice, Jamesetta Orleans, MD      . multivitamin with minerals tablet 1 tablet  1 tablet Oral Daily Rice, Jamesetta Orleans, MD      . ondansetron [facility]) tablet 4 mg  4 mg Oral Q6H PRN Rice, Jamesetta Orleans, MD       Or  . ondansetron [facility]) injection 4 mg  4 mg Intravenous Q6H PRN Rice, Jamesetta Orleans, MD      . potassium chloride SA (K-DUR,KLOR-CON) CR tablet 40 mEq  40 mEq Oral Once Fuller Plan, MD      . sodium chloride 0.9 % bolus 250 mL  250 mL Intravenous Once Harris, Abigail, PA-C      . sodium chloride flush (NS) 0.9 % injection 3 mL  3 mL Intravenous Q12H Rice, Jamesetta Orleans, MD      . thiamine (B-1) injection 100 mg  100 mg Intravenous Daily Rice, Jamesetta Orleans, MD       No current outpatient prescriptions on file.    Allergies: Allergies as of 04/04/2017  . (No Known Allergies)   History reviewed. No pertinent past medical history.  Past Surgical History:  Procedure Laterality Date  . HERNIA REPAIR     Family History  Problem Relation Age of Onset  . Cirrhosis Neg Hx   . Liver disease Neg Hx   . Cancer Neg Hx    Social History   Social History  . Marital status: Single    Spouse name: N/A  . Number of children: N/A  . Years of education: N/A   Occupational History  . Not on file.   Social History Main Topics  . Smoking status: Never Smoker  . Smokeless tobacco: Never Used  . Alcohol use 4.2 oz/week    7 Cans of beer per week  . Drug use: No  . Sexual activity: Not on file   Other Topics Concern  . Not on file   Social History Narrative  . No narrative on file    Review of Systems: Pertinent items noted in HPI and remainder of comprehensive ROS otherwise negative.  Physical Exam: Blood pressure 118/87, pulse (!) 105, temperature 97.9 F (36.6 C), temperature source Oral, resp. rate 18, height 5\' 10"   (1.778 m), weight 70.3 kg (155 lb), SpO2 99 %. BP 118/87   Pulse (!) 105   Temp 97.9 F (36.6 C) (Oral)   Resp 18   Ht 5\' 10"  (1.778 m)   Wt 70.3 kg (155 lb)   SpO2 99%   BMI 22.24 kg/m   General Appearance:    Alert, cooperative, no distress, appears stated age  Head:    Normocephalic, without obvious abnormality, atraumatic  Throat:   Lips, mucosa, and tongue normal; teeth and gums normal  Lungs:     Clear to auscultation bilaterally, respirations unlabored  Chest wall:    No tenderness or deformity  Heart:    Regular rate and rhythm, S1 and S2 normal, no murmur, rub   or gallop  Abdomen:     Soft, mild tenderness to palpation in RUQ, bowel sounds active all four quadrants, no masses, no organomegaly  Extremities:   Extremities normal, atraumatic, no cyanosis or edema    Lab results: Basic Metabolic Panel:  Recent Labs  16/10/96 0453 04/04/17 0700  NA 128*  --   K 3.3*  --   CL 97*  --   CO2 13*  --   GLUCOSE 194*  --   BUN 27*  --   CREATININE 2.62*  --   CALCIUM 8.0*  --   MG  --  2.3   Liver Function Tests:  Recent Labs  04/04/17 0453  AST 637*  ALT 116*  ALKPHOS 97  BILITOT 0.8  PROT 6.3*  ALBUMIN 2.7*   CBC:  Recent Labs  04/04/17 0453  WBC 7.6  HGB 17.2*  HCT 48.1  MCV 95.2  PLT 61*    Urine Drug Screen: Drugs of Abuse     Component Value Date/Time   LABOPIA NEGATIVE 03/31/2011 0920   LABOPIA NONE DETECTED 03/18/2011 1443   COCAINSCRNUR NEGATIVE 03/31/2011 0920   LABBENZ NEGATIVE 03/31/2011 0920   AMPHETMU NEGATIVE 03/31/2011 0920   AMPHETMU NONE DETECTED 03/18/2011 1443   THCU NONE DETECTED 03/18/2011 1443   LABBARB  03/18/2011 1443    NONE DETECTED        DRUG SCREEN FOR MEDICAL PURPOSES ONLY.  IF CONFIRMATION IS NEEDED FOR ANY PURPOSE, NOTIFY LAB WITHIN 5 DAYS.        LOWEST DETECTABLE LIMITS FOR URINE DRUG SCREEN Drug Class       Cutoff (ng/mL)  Amphetamine      1000 Barbiturate      200 Benzodiazepine   200 Tricyclics        300 Opiates          300 Cocaine          300 THC              50    Imaging results:  Dg Chest Port 1 View  Result Date: 04/04/2017 CLINICAL DATA:  Possible sepsis. EXAM: PORTABLE CHEST 1 VIEW COMPARISON:  None. FINDINGS: The heart size and mediastinal contours are within normal limits. Both lungs are clear. No pneumothorax or pleural effusion is noted. The visualized skeletal structures are unremarkable. IMPRESSION: No acute cardiopulmonary abnormality seen. Electronically Signed   By: Lupita Raider, M.D.   On: 04/04/2017 08:02     Assessment & Plan by Problem: Active Problems:   Diarrhea  Diarrhea: Mr. Adriana Simas presented today after a week long history of frequent non-bloody, non-mucinous, diarrhea that resolved yesterday. The patient's denial of travel, lack of fever or elevation in WBC count make a self-limiting viral etiology most likely. Inflammatory causes of diarrhea are less likely given the patient's reported lack of blood in his stool. In addition he denies any personal or family history of IBD and the lack of abdominal pain makes this an unlikely etiology. Given the patient's elevated transaminases Hepatitis is a possible cause of the episode of diarrhea and should be investigated further. Mr. Patsey Berthold labs are significant for hyponatremia, hypokalemia, hypochloremia, a decrease in CO2 and an increase in serum Creatinine and BUN as well as an elevated lactic acid. The hyponatremia, hypochloremia and hypokalemia are all likely a result of the week of fluid loss and poor PO intake during the diarrheal episode. Because Mr. Adriana Simas has not been seen in the medical system for a number of years, it is difficult to know how the BUN and Cr relate to his baseline, but their elevation could be explained by hypovolemia in the setting of diarrheal illness. These values should be reassessed pending fluid repletion. Finally the low CO2 could be a respiratory response to a metabolic acidosis evidenced by  the elevated serum lactate. This can also be reevaluated pending fluid administration.  --oral fluid repletion with PO intake as tolerated  --80 mEq K  --bolus sodium chloride 0.9%  --Hepatis panel --HIV panel  --monitor Bment   Chronic alcohol consumption: The patient endorsed a significant history of drinking "one and a half 40's" every day after work. His labs were significant for an elevation in AST and ALT in a >2:1 ratio. With his lack of medical follow-up in the last several years, it is difficult to interpret as a chronic elevation, as an elevation as a result of an acute Hepatis infection or as a result of his episode of diarrhea and evident hypovolemia.  --Abdominal U/S to assess for cirrhosis and Chronic Pancreatitis  --Attempt to connect with consistent medical provider   Thrombocytopenia: Mr. Nee's Platelets were markedly low at 61 on admission. This could be the result of chronic alcohol consumption, with elevated portal pressures increasing splenic sequestration of platelets. However, it could also be a manifestation of Hemolytic Uremic Syndrome in the setting of diarrheal illness with evident AKI. This is less likely based on the patient's reported lack of bloody stool. In addition the lack of a drop in hemoglobin and the normal Bilirubin do not support this diagnosis. In addition the the thrombocytopenia could be manifest from  a viral infection caused the episode of diarrhea and resolve.    This is a Psychologist, occupationalMedical Student Note.  The care of the patient was discussed with Dr. Samuella CotaSvalina  and the assessment and plan was formulated with their assistance.  Please see their note for official documentation of the patient encounter.   Signed: Myra Rudeunningham, [place], Medical Student 04/04/2017, 9:23 AM

## 2017-04-04 NOTE — ED Triage Notes (Signed)
Patient reports intermittent diarrhea for several days , denies emesis , no fever or chills .

## 2017-04-04 NOTE — Progress Notes (Signed)
Pt tranferred from Ed to room 6E23.  Pt alert and responsive with some confusion.  VSS.  Denies pain.  Pt NPO for Right Upper Quad US.

## 2017-04-04 NOTE — ED Notes (Signed)
Admitting Provider at bedside. 

## 2017-04-04 NOTE — ED Provider Notes (Signed)
[facility]-EMERGENCY DEPT Provider Note   CSN: [id number] Arrival date & time: 04/04/17  0306     History   Chief Complaint Chief Complaint  Patient presents with  . Diarrhea    HPI [name] is a 56 y.o. male with a past medical history of alcohol abuse who presents emergency Department with chief complaint of diarrhea. Patient states that he developed profuse, voluminous watery brown diarrhea starting 1 week ago. Patient states that he is essentially been using the bathroom every 5-10 minutes for the past week. The patient denies abdominal pain, bloody stool, mucus, hematochezia or melena. He denies vomiting. He has no recent antibiotic use. He has not been taking any Tylenol. Patient states that he also stopped drinking one week ago but denies any symptoms of withdrawal such as shakes, chills, vomiting. Patient states that he does not get shakes when he does not drink, but admits to drinking about 60 ounces of beer a day. The patient denies any other comorbidities. He does not see a doctor or regular basis. He denies feeling lightheaded or dizzy with standing.  HPI  History reviewed. No pertinent past medical history.  There are no active problems to display for this patient.   Past Surgical History:  Procedure Laterality Date  . HERNIA REPAIR         Home Medications    Prior to Admission medications   Medication Sig Start Date End Date Taking? Authorizing Provider  permethrin (ELIMITE) 5 % cream Apply to affected area once leave on for 8-12 hour than wash off repeat in 2 weeks Patient not taking: Reported on 04/04/2017 09/21/12   Earley Favor, NP    Family History No family history on file.  Social History Social History  Substance Use Topics  . Smoking status: Never Smoker  . Smokeless tobacco: Never Used  . Alcohol use 4.2 oz/week    7 Cans of beer per week     Allergies   Patient has no known allergies.   Review of Systems Review of Systems  Ten  systems reviewed and are negative for acute change, except as noted in the HPI.   Physical Exam Updated Vital Signs BP 95/70   Pulse (!) 109   Temp 97.9 F (36.6 C) (Oral)   Resp 16   Ht 5\' 10"  (1.778 m)   Wt 70.3 kg   SpO2 97%   BMI 22.24 kg/m   Physical Exam  Constitutional: He is oriented to person, place, and time. He appears well-developed. He has a sickly appearance. No distress.  Appears protein calorie malnourished  HENT:  Head: Normocephalic and atraumatic.  Eyes: Conjunctivae and EOM are normal. Pupils are equal, round, and reactive to light. No scleral icterus.  Neck: Normal range of motion. Neck supple.  Cardiovascular: Regular rhythm and normal heart sounds.   Tachycardic, distant heart sounds  Pulmonary/Chest: Effort normal and breath sounds normal. No respiratory distress. He has no wheezes. He has no rales.  Abdominal: Soft. Bowel sounds are normal. He exhibits no distension and no mass. There is no tenderness. There is no guarding.  Musculoskeletal: Normal range of motion. He exhibits no edema.  Neurological: He is alert and oriented to person, place, and time.  Skin: Skin is warm and dry. Capillary refill takes more than 3 seconds. He is not diaphoretic.  Psychiatric: His behavior is normal.  Cool extremities with cap refill  >3  Nursing note and vitals reviewed.    ED Treatments / Results  Labs (all labs ordered are listed, but only abnormal results are displayed) Labs Reviewed  COMPREHENSIVE METABOLIC PANEL - Abnormal; Notable for the following:       Result Value   Sodium 128 (*)    Potassium 3.3 (*)    Chloride 97 (*)    CO2 13 (*)    Glucose, Bld 194 (*)    BUN 27 (*)    Creatinine, Ser 2.62 (*)    Calcium 8.0 (*)    Total Protein 6.3 (*)    Albumin 2.7 (*)    AST 637 (*)    ALT 116 (*)    GFR calc non Af Amer 26 (*)    GFR calc Af Amer 30 (*)    Anion gap 18 (*)    All other components within normal limits  CBC - Abnormal; Notable for  the following:    Hemoglobin 17.2 (*)    MCH 34.1 (*)    Platelets 61 (*)    All other components within normal limits  I-STAT CG4 LACTIC ACID, ED - Abnormal; Notable for the following:    Lactic Acid, Venous 5.09 (*)    All other components within normal limits  GASTROINTESTINAL PANEL BY PCR, STOOL (REPLACES STOOL CULTURE)  CULTURE, BLOOD (ROUTINE X 2)  CULTURE, BLOOD (ROUTINE X 2)  URINALYSIS, ROUTINE W REFLEX MICROSCOPIC  HEPATITIS PANEL, ACUTE  ACETAMINOPHEN LEVEL  MAGNESIUM  URINALYSIS, ROUTINE W REFLEX MICROSCOPIC    EKG  EKG Interpretation None       Radiology No results found.  Procedures Procedures (including critical care time)  Medications Ordered in ED Medications  sodium chloride 0.9 % bolus 1,000 mL (not administered)    And  sodium chloride 0.9 % bolus 250 mL (not administered)  sodium chloride 0.9 % bolus 1,000 mL (1,000 mLs Intravenous New Bag/Given 04/04/17 0657)  thiamine (B-1) injection 100 mg (100 mg Intravenous Given 04/04/17 0718)     Initial Impression / Assessment and Plan / ED Course  I have reviewed the triage vital signs and the nursing notes.  Pertinent labs & imaging results that were available during my care of the patient were reviewed by me and considered in my medical decision making (see chart for details).  Clinical Course as of Apr 04 732  Sat Apr 04, 2017  0708 Sodium: (!) 128 [AH]  [id number] Creatinine: (!) 2.62 [AH]  0708 Albumin: (!) 2.7 [AH]  0708 AST: (!) 637 [AH]  0708 ALT: (!) 116 [AH]  0708 Anion gap: (!) 18 [AH]  0708 Platelets: (!) 61 [AH]  0709 Patient with transaminitis,   [AH]  0730 Patient technically fits sepsis protocol because of elevated lactic acid, hypotension and tachycardia. However, he does not have an elevated white blood cell count, does not have an fever, has no abdominal pain or vomiting. I have low suspicion for sepsis as the cause of his elevated lactic acid, and I think that this is all secondary to  extreme volume depletion. Therefore, I have not initiated antibiotics. I have initiated fluid volume resuscitation. I will discuss this with the admitting team.  [AH]    Clinical Course User Index [AH] Arthor CaptainHarris, Orenthal Debski, PA-C  Patient admitted by the internal medicine teaching service. His lactic acid is improved after fluid resuscitation.  Final Clinical Impressions(s) / ED Diagnoses   Final diagnoses:  AKI (acute kidney injury) (HCC)  Hyponatremia  Hypoalbuminemia  Thrombocytopenia (HCC)  Increased anion gap metabolic acidosis  Transaminitis  ETOH abuse  Severe  dehydration  Lactic acidosis    New Prescriptions New Prescriptions   No medications on file     Arthor Captain, PA-C 04/04/17 1531    Tegeler, Canary Brim, MD 04/04/17 [phone]

## 2017-04-04 NOTE — H&P (Signed)
Date: 04/04/2017               Patient Name:  [name] MRN: [id number]  DOB: [birth year] Age / Sex: 56 y.o., male   PCP: System, Provider Not In         Medical Service: Internal Medicine Teaching Service         Attending Physician: Dr. Lynnae January, Real Cons, MD    First Contact: Cyndie Mull, MS3 Pager: [phone]  Second Contact: Third Contact:  Dr. Jari Favre Dr. Benjamine Mola Pager: Pager: [phone]       After Hours (After 5p/  First Contact Pager: [phone]  weekends / holidays): Second Contact Pager: [phone]   Chief Complaint: diarrhea  History of Present Illness:  Mr. Gaertner is a 56yo male with PMH of alcohol abuse presenting to [facility] at prompting of his girlfriend for 3 days of diarrhea. Patient states that since 5/2 he has had watery diarrhea about twice an hour; he denies associated nausea, vomiting, abdominal pain, fevers, chills, hematochezia, melena. He states that he has not had further episodes since yesterday and his appetite is returning. He endorses decreased PO intake due to trying to prevent further episodes of diarrhea in the last couple of days; he had noticed decreased urine (without hematuria, frothy urine) but has urinated since IVF administered in the ED; he denies dizziness. He denies any sick contacts, travel, prior similar episodes. He does endorse multi year history of drinking about 60oz of beer a day but has decided to quit on Wednesday (day of symptom onset). He denies personal or family history of GI problems; illicit drug use; takes no medicines.  Meds:  No outpatient prescriptions have been marked as taking for the 04/04/17 encounter [facility] Encounter).    Allergies: Allergies as of 04/04/2017  . (No Known Allergies)   History reviewed. No pertinent past medical history.  Family History: patient denies family history of heart disease, strokes, IBD, cancers, liver disease  Social History: Patient lives with his girlfriend of 60 years; he  denies tobacco or illicit drug use. He endorses 60oz per day beer consumption but quit 3 days prior to admission - no history of withdrawal or seizures that he knows of.   Review of Systems: A complete ROS was negative except as per HPI.    Physical Exam: Blood pressure 118/87, pulse (!) 105, temperature 97.9 F (36.6 C), temperature source Oral, resp. rate 18, height '5\' 10"'$  (1.778 m), weight 70.3 kg (155 lb), SpO2 99 %. General: alert, well-developed, and cooperative to examination.  Head: normocephalic and atraumatic.  Eyes: vision grossly intact, pupils equal, pupils round, pupils reactive to light, anicteric; mild injection  Mouth: dry mucous membranes  Neck: supple, full ROM, no thyromegaly, no JVD. No lymphadenopathy.  Lungs: normal respiratory effort, no accessory muscle use, normal breath sounds, no crackles, and no wheezes. Heart: tachycardic, regular rhythm, no murmur, no gallop, and no rub.  Abdomen: soft, normal bowel sounds, no distention, no guarding, no rebound tenderness. Hepatomegaly and mild tenderness in RUQ.  Msk: no joint swelling, no joint warmth, and no redness over joints. Low muscle mass throughout. Pulses: 2+ DP/PT pulses bilaterally Extremities: No cyanosis, clubbing, edema Neurologic: alert & oriented X3, cranial nerves II-XII intact, strength normal in all extremities.  Skin: turgor normal and no rashes.  Psych: Oriented X3, memory intact for recent and remote, normally interactive, good eye contact, not anxious appearing, and not depressed appearing  LABS: Na 128, K 3.3, Cl  97, BUN 27, Cr 2.62, Glu 194 Mag 2.3 Alb 2.7, AST 637, ALT 116, Alk phos 97, Bili 0.8 WBC 7.6, Hgb 17.2 (b/l 10), Hct 48.1, Plts 61 (b/l 130) Acetaminophen <10 LA 5.09  EKG: Personally reviewed - sinus tachycardia, LA enlargement, no ischemic ST or T wave changes  CXR: Personally reviewed - no pneumonia, infiltrate, no cardiomegaly  Assessment & Plan by Problem: Active Problems:    Diarrhea  Diarrhea: Patient with 3 day history of watery diarrhea that has since resolved without other infectious symptoms. He also has elevated AST/ALT with normal alk phos and bili. He does have a history of possible pancreatitis/hepatitis at prior admission; with his continued alcohol use, it would be possible that he has pancreatic insufficiency leading to his symptoms though acute onset is questionable. Other etiologies are hepatitis, HIV, and infectious enterocolitis.  --hepatitis panel --fecal fat --GI panel --RUQ U/S --f/u AM CMet, CBC  AKI, anion gap metabolic acidosis: Cr 2.6 on admission (prior 0.7), with hyponatremia, hypokalemia, hypochloremia, with anion gap of 18 and lactic acid of 5. This is consistent with volume depletion 2/2 diarrhea and fasting  --s/p 2.250L NS bolus - LA 2.1 --replete K --repeat Bmet shows resolution of gap --f/u UA --follow Bmets  h/o fatty liver h/o Alcohol use Thrombocytopenia: Patient noted to have fatty infiltration of liver with mild portal hypertension in 2012. On admission he has elevated AST and ALT, hyponatremia, hypoalbuminemia, thrombocytopenia. Transaminitis could be explained by his volume depletion vs acute hepatitis. Patient has not had drink 3 days; no prior withdrawals or seizures per patient. --folic acid, thiamine --follow CBCs --f/u RUQ U/S --follow CMets  Diet: regular VTE ppx: SCD's Code: FULL  Dispo: Admit patient to Inpatient with expected length of stay greater than 2 midnights.  SignedAlphonzo Grieve, MD 04/04/2017, 9:29 AM  Pager [phone]

## 2017-04-04 NOTE — Progress Notes (Signed)
Pt gone down for US via bed.

## 2017-04-04 NOTE — ED Notes (Signed)
Pt unable to void at this time, 

## 2017-04-04 NOTE — ED Notes (Signed)
ED Provider at bedside. 

## 2017-04-04 NOTE — Progress Notes (Signed)
CRITICAL VALUE ALERT  Critical value received:  Lactic Acid  Date of notification:  04/04/2017  Time of notification:  1822  Critical value read back: Yes  Nurse who received alert:  Roxanne MinsAlbert Ceasia Elwell  MD notified (1st page):  Yes  Time of first page:  1823  MD notified (2nd page): N/A  Time of second page:n/a  Responding MD:  Dr Frederik Pearhris Wright  Time MD responded:  [phone]

## 2017-04-05 ENCOUNTER — Inpatient Hospital Stay (HOSPITAL_COMMUNITY): Payer: Self-pay

## 2017-04-05 DIAGNOSIS — F1020 Alcohol dependence, uncomplicated: Secondary | ICD-10-CM

## 2017-04-05 DIAGNOSIS — E872 Acidosis: Secondary | ICD-10-CM

## 2017-04-05 DIAGNOSIS — E8729 Other acidosis: Secondary | ICD-10-CM

## 2017-04-05 DIAGNOSIS — F1010 Alcohol abuse, uncomplicated: Secondary | ICD-10-CM | POA: Diagnosis present

## 2017-04-05 DIAGNOSIS — N189 Chronic kidney disease, unspecified: Secondary | ICD-10-CM

## 2017-04-05 LAB — CBC
HCT: 42.5 % (ref 39.0–52.0)
HEMOGLOBIN: 15.2 g/dL (ref 13.0–17.0)
MCH: 33.7 pg (ref 26.0–34.0)
MCHC: 35.8 g/dL (ref 30.0–36.0)
MCV: 94.2 fL (ref 78.0–100.0)
Platelets: 40 10*3/uL — ABNORMAL LOW (ref 150–400)
RBC: 4.51 MIL/uL (ref 4.22–5.81)
RDW: 13.9 % (ref 11.5–15.5)
WBC: 4.6 10*3/uL (ref 4.0–10.5)

## 2017-04-05 LAB — URINALYSIS, ROUTINE W REFLEX MICROSCOPIC
Bacteria, UA: NONE SEEN
Bilirubin Urine: NEGATIVE
GLUCOSE, UA: NEGATIVE mg/dL
Ketones, ur: 5 mg/dL — AB
LEUKOCYTES UA: NEGATIVE
Nitrite: NEGATIVE
PH: 5 (ref 5.0–8.0)
Protein, ur: 100 mg/dL — AB
SPECIFIC GRAVITY, URINE: 1.018 (ref 1.005–1.030)

## 2017-04-05 LAB — COMPREHENSIVE METABOLIC PANEL
ALBUMIN: 2 g/dL — AB (ref 3.5–5.0)
ALT: 99 U/L — ABNORMAL HIGH (ref 17–63)
ANION GAP: 12 (ref 5–15)
AST: 489 U/L — ABNORMAL HIGH (ref 15–41)
Alkaline Phosphatase: 79 U/L (ref 38–126)
BILIRUBIN TOTAL: 0.8 mg/dL (ref 0.3–1.2)
BUN: 33 mg/dL — ABNORMAL HIGH (ref 6–20)
CO2: 15 mmol/L — AB (ref 22–32)
Calcium: 7.8 mg/dL — ABNORMAL LOW (ref 8.9–10.3)
Chloride: 105 mmol/L (ref 101–111)
Creatinine, Ser: 1.83 mg/dL — ABNORMAL HIGH (ref 0.61–1.24)
GFR calc non Af Amer: 40 mL/min — ABNORMAL LOW (ref >60)
GFR, EST AFRICAN AMERICAN: 47 mL/min — AB (ref >60)
Glucose, Bld: 110 mg/dL — ABNORMAL HIGH (ref 65–99)
Potassium: 4 mmol/L (ref 3.5–5.1)
SODIUM: 132 mmol/L — AB (ref 135–145)
TOTAL PROTEIN: 5.2 g/dL — AB (ref 6.5–8.1)

## 2017-04-05 LAB — MAGNESIUM: MAGNESIUM: 2.5 mg/dL — AB (ref 1.7–2.4)

## 2017-04-05 MED ORDER — LACTULOSE 10 GM/15ML PO SOLN
10.0000 g | Freq: Two times a day (BID) | ORAL | Status: DC
  Administered 2017-04-05 – 2017-04-06 (×3): 10 g via ORAL
  Filled 2017-04-05 (×3): qty 15

## 2017-04-05 MED ORDER — LACTATED RINGERS IV SOLN
INTRAVENOUS | Status: AC
  Administered 2017-04-05 – 2017-04-06 (×2): via INTRAVENOUS

## 2017-04-05 NOTE — Progress Notes (Signed)
   Subjective:  Patient states he has not had further episodes of diarrhea, he is not having abdominal pain; he was able to eat last night but does not have much of an appetite this morning.  Per nurse, patient had confusion overnight.  Objective:  Vital signs in last 24 hours: Vitals:   04/04/17 1709 04/04/17 2124 04/05/17 0542 04/05/17 0955  BP: 112/71 103/75 100/71 94/67  Pulse: 98 81 77 75  Resp: 20 19 20 18   Temp: 98.4 F (36.9 C) 99.8 F (37.7 C) 98.1 F (36.7 C) 98.4 F (36.9 C)  TempSrc: Oral Oral Oral Oral  SpO2: 100% 98% 99% 100%  Weight:  59.7 kg (131 lb 9.6 oz)    Height:       Constitutional: NAD, lying in bed CV: RRR, no murmurs, rubs of gallops appreciated, no LE edema  Resp: CTAB, no increased work of breathing Abd: Soft, NDNT, +BS Neuro: alert, oriented to person and year only. Moves all extremities, follows commands, no asterixis, no CN deficits appreciated.  Assessment/Plan:  Principal Problem:   Diarrhea Active Problems:   Chronic pancreatitis (HCC)   Thrombocytopenia (HCC)   AKI (acute kidney injury) (HCC)   Transaminitis   ETOH abuse   Increased anion gap metabolic acidosis  Diarrhea: Diarrhea has resolved; still no fevers or leukocytosis. His tachycardia resolved with fluid administration. --d/c contact precautions  AKI: Cr 2.6 on admission (prior 0.7) improved to 1.8 today with fluids though still having several metabolic derangements. Anion gap has resolved.  --f/u UA --follow Bmets --LR IVF --renal u/s  h/o fatty liver h/o Alcohol use Thrombocytopenia Metabolic derangements: Patient noted to have fatty infiltration of liver with mild portal hypertension in 2012. US on admission showed hepatic steatosis. On admission he has elevated AST and ALT, hyponatremia, hypoalbuminemia, thrombocytopenia which would be consistent with cirrhosis. Transaminitis responding some to fluids though still elevated. Likely not alcoholic hepatitis and  Maddrey's discriminant score too low to have benefit of glucocorticoids. In setting of above and confusion, patient is likely experiencing hepatic encephalopathy.  --folic acid, thiamine --follow CBCs --follow CMets --CIWA monitoring --Lactulose --f/u acute hepatitis panel --f/u HIV  Diet: Regular VTE ppx: SCD's Code: FULL  Dispo: Anticipated discharge in approximately 2-3day(s).   Nyra MarketSvalina, Akiera Allbaugh, MD 04/05/2017, 3:15 PM Pager [phone]

## 2017-04-05 NOTE — Progress Notes (Signed)
  Date: 04/05/2017  Patient name: [name]  Medical record number: [id number]  Date of birth: Jan 10, 1961   I have seen and evaluated [name] and discussed their care with the Residency Team. [name] is a 56 year old man with history of alcoholism. He has no records in our EHR since 2013. His chief complaint was diarrhea. This resolved the day prior to admission. Additionally, he had complained of anorexia. On admission, he was found to have renal failure that due to the lack of recent records, it was not known whether this is acute or chronic. He also had an anion gap metabolic acidosis, thrombocytopenia, hyponatremia, elevated lactic acid level, elevated transaminases. A right upper quadrant ultrasound showed gallbladder sludge and small stones and probable hepatic steatosis.  This morning, the nurse reported that overnight the patient was confused. The patient had no significant complaints today.  PMHx, Fam Hx, and/or Soc Hx : The patient states that he has a family history of liver disease. He lives with his girlfriend and states that he drinks 16 ounces of beer per day. His last drink was 3 days prior to admission.  Vitals:   04/05/17 0542 04/05/17 0955  BP: 100/71 94/67  Pulse: 77 75  Resp: 20 18  Temp: 98.1 F (36.7 C) 98.4 F (36.9 C)  General : Thin man, older than stated age, no acute distress Heart RRR no MRG LCTAB ABD + BS, S NT ND Ext no edema +2 DP pulses No asterixis  PT 17.2 Na 128 - 132 Co2 13 - 15 Cr 2.62 - 1.83 AST 637 - 489 ALT 116 - 99 T Bili 0.8 LA 5.09 - 2.0 Plts 61 - 40  I indep viewed his CXR 1 view : NAD, slight rotation and over penetrated I indep viewed his EKG : sinus tach, nl axis, LAD, no ischemic changes  Assessment and Plan: I have seen and evaluated the patient as outlined above. I agree with the formulated Assessment and Plan as detailed in the residents' note, with the following changes:   1. Acute diarrhea - resolved prior to  admission  2. Acute encephalopathy 3. Transaminitis 4. Acute on chronic renal failure 5. Thrombocytopenia 6. Metabolic gap acidosis  The one unifying diagnosis would be alcoholism with cirrhosis leading to poor oral intake and hepatic encephalopathy with or without alcohol withdrawal. He gave a history of chronic alcohol intake and has an AST > ALT. The prolonged PT and thrombocytopenia would support the diagnosis of cirrhosis. His gap acidosis and lactic acidosis can be explained by his starvation and alcoholic ketoacidosis. His creatinine has trended down with IV fluids making prerenal the most likely etiology. If he has cirrhosis, his acute encephalopathy could definitely be caused by hepatic encephalopathy. I do not think he has alcoholic hepatitis as his bilirubin is normal. Additionally his MDF is somewhere between 10 and 28 depending on the reference range PT used, indicating he only needs symptomatic treatment if he did in fact have this diagnosis. Supportive care will be primarily here. Our interventions will include 1. Lactulose to treat presumed hepatic encephalopathy 2. Renal ultrasound to exclude postrenal causes 3. IV fluids (LR) to treat the acute component of his renal failure 4. CIWA protocol for DTs  5. Thiamine and folate 6. Follow labs and clinical response  7. D/C contact precautions as patient no longer having diarrhea  Burns SpainButcher, Elizabeth A, MD 5/6/201812:57 PM

## 2017-04-06 DIAGNOSIS — D696 Thrombocytopenia, unspecified: Secondary | ICD-10-CM

## 2017-04-06 DIAGNOSIS — Z8719 Personal history of other diseases of the digestive system: Secondary | ICD-10-CM

## 2017-04-06 DIAGNOSIS — E43 Unspecified severe protein-calorie malnutrition: Secondary | ICD-10-CM

## 2017-04-06 DIAGNOSIS — F1010 Alcohol abuse, uncomplicated: Secondary | ICD-10-CM

## 2017-04-06 DIAGNOSIS — K7010 Alcoholic hepatitis without ascites: Secondary | ICD-10-CM

## 2017-04-06 DIAGNOSIS — E871 Hypo-osmolality and hyponatremia: Secondary | ICD-10-CM | POA: Diagnosis present

## 2017-04-06 DIAGNOSIS — E872 Acidosis: Secondary | ICD-10-CM

## 2017-04-06 DIAGNOSIS — R531 Weakness: Secondary | ICD-10-CM

## 2017-04-06 DIAGNOSIS — N179 Acute kidney failure, unspecified: Secondary | ICD-10-CM

## 2017-04-06 DIAGNOSIS — G9340 Encephalopathy, unspecified: Secondary | ICD-10-CM

## 2017-04-06 LAB — COMPREHENSIVE METABOLIC PANEL
ALBUMIN: 1.8 g/dL — AB (ref 3.5–5.0)
ALT: 79 U/L — ABNORMAL HIGH (ref 17–63)
AST: 249 U/L — AB (ref 15–41)
Alkaline Phosphatase: 82 U/L (ref 38–126)
Anion gap: 9 (ref 5–15)
BILIRUBIN TOTAL: 0.8 mg/dL (ref 0.3–1.2)
BUN: 34 mg/dL — ABNORMAL HIGH (ref 6–20)
CO2: 18 mmol/L — AB (ref 22–32)
Calcium: 7.9 mg/dL — ABNORMAL LOW (ref 8.9–10.3)
Chloride: 104 mmol/L (ref 101–111)
Creatinine, Ser: 1.64 mg/dL — ABNORMAL HIGH (ref 0.61–1.24)
GFR calc Af Amer: 53 mL/min — ABNORMAL LOW (ref >60)
GFR calc non Af Amer: 46 mL/min — ABNORMAL LOW (ref >60)
GLUCOSE: 115 mg/dL — AB (ref 65–99)
POTASSIUM: 3.9 mmol/L (ref 3.5–5.1)
Sodium: 131 mmol/L — ABNORMAL LOW (ref 135–145)
Total Protein: 4.7 g/dL — ABNORMAL LOW (ref 6.5–8.1)

## 2017-04-06 LAB — CBC
HEMATOCRIT: 40.7 % (ref 39.0–52.0)
HEMOGLOBIN: 14.6 g/dL (ref 13.0–17.0)
MCH: 33.7 pg (ref 26.0–34.0)
MCHC: 35.9 g/dL (ref 30.0–36.0)
MCV: 94 fL (ref 78.0–100.0)
Platelets: 43 10*3/uL — ABNORMAL LOW (ref 150–400)
RBC: 4.33 MIL/uL (ref 4.22–5.81)
RDW: 14 % (ref 11.5–15.5)
WBC: 5.6 10*3/uL (ref 4.0–10.5)

## 2017-04-06 LAB — HEPATITIS PANEL, ACUTE
HCV Ab: <0.1 s/co ratio (ref 0.0–0.9)
HEP A IGM: NEGATIVE
Hep B C IgM: NEGATIVE
Hepatitis B Surface Ag: NEGATIVE

## 2017-04-06 LAB — OSMOLALITY, URINE: OSMOLALITY UR: 578 mosm/kg (ref 300–900)

## 2017-04-06 LAB — CK: Total CK: 233 U/L (ref 49–397)

## 2017-04-06 LAB — PROTIME-INR
INR: 1.08
Prothrombin Time: 14.1 seconds (ref 11.4–15.2)

## 2017-04-06 LAB — OSMOLALITY: Osmolality: 289 mOsm/kg (ref 275–295)

## 2017-04-06 MED ORDER — LACTULOSE 10 GM/15ML PO SOLN
30.0000 g | Freq: Three times a day (TID) | ORAL | Status: DC
  Administered 2017-04-06 – 2017-04-08 (×5): 30 g via ORAL
  Filled 2017-04-06 (×5): qty 45

## 2017-04-06 MED ORDER — LACTATED RINGERS IV SOLN: INTRAVENOUS | Status: DC

## 2017-04-06 MED ORDER — LACTULOSE 10 GM/15ML PO SOLN
10.0000 g | Freq: Three times a day (TID) | ORAL | Status: DC
  Administered 2017-04-06: 10 g via ORAL
  Filled 2017-04-06: qty 15

## 2017-04-06 NOTE — Progress Notes (Signed)
Subjective: [name] appeared somewhat lethargic today as we frequently closed his eyes during questioning. He denied continuation of the diarrhea that brought him into the hospital. He did however say that he did not want solid food and that he does not have much of an appetite.    Objective: Vital signs in last 24 hours: Vitals:   04/05/17 1647 04/05/17 2128 04/06/17 0524 04/06/17 0945  BP: 115/87 111/79 116/72 (!) 90/59  Pulse: (!) 106 (!) 106 100 (!) 103  Resp: 16 17 18 18   Temp: 99.8 F (37.7 C) 98.4 F (36.9 C) 97.8 F (36.6 C) 98.4 F (36.9 C)  TempSrc: Oral Oral Oral Oral  SpO2: 100% 100% 100% 95%  Weight:  63.6 kg (140 lb 4.8 oz)    Height:        Physical Exam:  General: lethargic appearing male, alert and oriented CV: RRR, nMRG Pulm:CAB  Abd: NTND    Micro Results: Recent Results (from the past 240 hour(s))  Blood Culture (routine x 2)     Status: None (Preliminary result)   Collection Time: 04/04/17  7:45 AM  Result Value Ref Range Status   Specimen Description BLOOD RIGHT ANTECUBITAL  Final   Special Requests   Final    BOTTLES DRAWN AEROBIC AND ANAEROBIC Blood Culture adequate volume   Culture NO GROWTH 1 DAY  Final   Report Status PENDING  Incomplete  Blood Culture (routine x 2)     Status: None (Preliminary result)   Collection Time: 04/04/17  7:55 AM  Result Value Ref Range Status   Specimen Description BLOOD RIGHT HAND  Final   Special Requests   Final    BOTTLES DRAWN AEROBIC ONLY Blood Culture adequate volume   Culture NO GROWTH 1 DAY  Final   Report Status PENDING  Incomplete   Assessment/Plan: Principal Problem:   Diarrhea Active Problems:   Chronic pancreatitis (HCC)   Thrombocytopenia (HCC)   AKI (acute kidney injury) (HCC)   Transaminitis   ETOH abuse   Increased anion gap metabolic acidosis  Diarrhea and malnutrition: This problem appears to be largely resolved. He said that the diarrhea resolved the day before he presented to  the hospital and it has not resumed since he has been admitted. In addition, the urinalysis showed the presence of ketones, consistent with a diagnosis of poor intake.  --thiamine supplement --multivitamin --encourage regular PO intake   Hyponatremia: When [name] presented, his Na was low at 128. It was presumed at the time that this was a result of the long course of diarrhea the patient endorsed. However, the Na is now only 131, which is slightly lower than would be expected given the volume of Sodium Chloride given during resuscitation. Given that the patient's last reported drink was over a week ago, beer potomania and pseudohyponatremia are less likely. We now need to rule out other causes of hyponatremia such as SIADH.   --serum and urine osmolality   Hepatitis and Alcohol Use Disorder:  [name]'s liver enzymes remain elevated after several days of fluid administration despite dropping from where they were on admission. He also had an abdominal ultrasound that showed fatty changes in his liver. Both the changes evident on imaging of the liver and the persistent elevation in transaminases could be explained by early alcoholic hepatitis, consistent with [name]'s significant history of regular alcohol consumption. The lack of elevated Bilirubin and PT could indicate that the changes are in an early phase. Therefore Naltrexone  administration to help patient with abstinence from alcohol could be helpful.   Thrombocytopenia:  [name] Platelets were markedly low at 61 on admission. Since receiving extra fluid volume, they have dropped to 46. This could be the result of chronic alcohol consumption, with elevated portal pressures increasing splenic sequestration of platelets. In addition the the thrombocytopenia could be manifest from a viral infection caused the episode of diarrhea and resolve.  Diet: Full   This is a Psychologist, occupational Note.  The care of the patient was discussed with Dr.  Samuella Cota and the assessment and plan formulated with their assistance.  Please see their attached note for official documentation of the daily encounter.   LOS: 2 days   Myra Rude, Medical Student 04/06/2017, 12:54 PM

## 2017-04-06 NOTE — Progress Notes (Signed)
   Subjective:  Patient denies further diarrhea; he denies abdominal pain, nausea, vomiting. He has only been drinking rather than taking in solids, which per patient is because he doesn't feel like eating solids at this time.   Objective:  Vital signs in last 24 hours: Vitals:   04/05/17 1647 04/05/17 2128 04/06/17 0524 04/06/17 0945  BP: 115/87 111/79 116/72 (!) 90/59  Pulse: (!) 106 (!) 106 100 (!) 103  Resp: 16 17 18 18   Temp: 99.8 F (37.7 C) 98.4 F (36.9 C) 97.8 F (36.6 C) 98.4 F (36.9 C)  TempSrc: Oral Oral Oral Oral  SpO2: 100% 100% 100% 95%  Weight:  63.6 kg (140 lb 4.8 oz)    Height:       Constitutional: NAD, lying in bed CV: RRR, no murmurs, rubs of gallops appreciated, no LE edema  Resp: CTAB, no increased work of breathing Abd: Soft, NDNT, +BS Neuro: alert, orientedx3. Moves all extremities, follows commands, no asterixis, no CN deficits appreciated; moves and responds slowly, generalized weakness.  Assessment/Plan:  Principal Problem:   Diarrhea Active Problems:   Chronic pancreatitis (HCC)   Thrombocytopenia (HCC)   AKI (acute kidney injury) (HCC)   Transaminitis   ETOH abuse   Increased anion gap metabolic acidosis  AKI: Cr 2.6 on admission; improving to 1.6 today. UA showed some ketones and was positive for hemoglobin with no RBC's appreciated on microscopy. Renal U/S was unremarkable. There is concern for rhabdomyolysis contributing to his AKI in addition to hypovolemia; however on CK today was normal despite still having red-tinged urine.  --follow Bmets --LR IVF  h/o fatty liver Alcohol use Thrombocytopenia: Patient with likely component of alcohol hepatitis with possible infectious etiology as well that we are evaluating. Mentation is clearer today, though patient still slow to respond/move.  --folic acid, thiamine --follow CBCs --follow CMets --CIWA monitoring --Lactulose --ambulate with assistance --f/u acute hepatitis panel --f/u  HIV  Hyponatremia: Sodium is not appropriately increasing with fluid administration as would be expected in hypovolemia or beer potomania. --f/u serum and urine osmol  Diet: Regular VTE ppx: SCD's Code: FULL  Dispo: Anticipated discharge in approximately 2-3 day(s).   Nyra MarketSvalina, Kayleigh Broadwell, MD 04/06/2017, 1:34 PM Pager [phone]

## 2017-04-06 NOTE — Evaluation (Signed)
Occupational Therapy Evaluation Patient Details Name: Jonathan FlattenJerome L Crane MRN: [id number] DOB: 01-26-1961 Today's Date: 04/06/2017    History of Present Illness  56yo male with PMH of alcohol abuse [facility] prompting of his girlfriend for 3 days of diarrhea. Patient states that since 5/2 he has had watery diarrhea about twice an hour; he denies associated nausea, vomiting, abdominal pain, fevers, chills, hematochezia, melena. He states that he has not had further episodes since yesterday and his appetite is returning. He endorses decreased PO intake due to trying to prevent further episodes of diarrhea in the last couple of days; he had noticed decreased urine (without hematuria, frothy urine) but has urinated since IVF administered in the ED; he denies dizziness. He denies any sick contacts, travel, prior similar episodes. He does endorse multi year history of drinking about 60oz of beer a day but has decided to quit on Wednesday (day of symptom onset). He denies personal or family history of GI problems; illicit drug use; takes no medicines   Clinical Impression   Patient presenting with decreased I in self care, balance, functional mobility/transfers, safety awareness.Patient reports being independent and working full time in lawn care service [employer]. Patient currently functioning at supervision - min A. Patient will benefit from acute OT to increase overall independence in the areas of ADLs, functional mobility, and safety  in order to safely discharge home. Pt showing lack of insight as he has 2 LOB during session with functional ambulation and continues to tell therapist he needs to go to work tomorrow.     Follow Up Recommendations  Home health OT;Supervision/Assistance - 24 hour    Equipment Recommendations  Tub/shower bench    Recommendations for Other Services       Precautions / Restrictions Precautions Precautions: Fall Restrictions Weight Bearing Restrictions: No       Mobility Bed Mobility Overal bed mobility: Needs Assistance Bed Mobility: Supine to Sit;Sit to Supine   Sidelying to sit: Supervision;HOB elevated Supine to sit: Supervision;HOB elevated Sit to supine: Supervision;HOB elevated      Transfers Overall transfer level: Needs assistance Equipment used: 1 person hand held assist Transfers: Stand Pivot Transfers;Sit to/from Stand Sit to Stand: Min guard Stand pivot transfers: Min guard            Balance Overall balance assessment: Needs assistance Sitting-balance support: Feet supported Sitting balance-Leahy Scale: Good     Standing balance support: During functional activity;Single extremity supported Standing balance-Leahy Scale: Fair     ADL either performed or assessed with clinical judgement   ADL Overall ADL's : Needs assistance/impaired      General ADL Comments: Pt requires overall steady assistance with standing balance during functional tasks. Pt needing cues for safety and 2 LOB with ambulation requiring min - mod A to correct.                  Pertinent Vitals/Pain Pain Assessment: No/denies pain     Hand Dominance Right   Extremity/Trunk Assessment Upper Extremity Assessment Upper Extremity Assessment: Generalized weakness   Lower Extremity Assessment Lower Extremity Assessment: Defer to PT evaluation       Communication Communication Communication: No difficulties   Cognition Arousal/Alertness: Awake/alert Behavior During Therapy: Impulsive Overall Cognitive Status: No family/caregiver present to determine baseline cognitive functioning Area of Impairment: Safety/judgement;Problem solving         Safety/Judgement: Decreased awareness of safety;Decreased awareness of deficits   Problem Solving: Slow processing  Home Living Family/patient expects to be discharged to:: Private residence Living Arrangements: Spouse/significant other Available Help at Discharge:  Family;Available 24 hours/day Type of Home: Apartment Home Access: Stairs to enter Entrance Stairs-Number of Steps: 1 Entrance Stairs-Rails: Right Home Layout: One level;Full bath on main level     Bathroom Shower/Tub: Tub/shower unit;Curtain   Bathroom Toilet: Standard Bathroom Accessibility: Yes   Home Equipment: Cane - quad;Cane - single point       Prior Functioning/Environment Level of Independence: Independent        Comments: 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, music care        OT Problem List: Decreased strength;Decreased range of motion;Decreased activity tolerance;Impaired balance (sitting and/or standing);Decreased safety awareness;Decreased knowledge of use of DME or AE      OT Treatment/Interventions: Self-care/ADL training;Therapeutic exercise;Neuromuscular education;Energy conservation;DME and/or AE instruction;Therapeutic activities;Balance training;Patient/family education;Manual therapy    OT Goals(Current goals can be found in the care plan section) Acute Rehab OT Goals Patient Stated Goal: " I've got to go back to work." OT Goal Formulation: With patient Time For Goal Achievement: 04/20/17 Potential to Achieve Goals: Fair ADL Goals Pt Will Perform Grooming: with modified independence Pt Will Perform Upper Body Bathing: with modified independence Pt Will Perform Lower Body Bathing: with supervision Pt Will Perform Upper Body Dressing: with modified independence Pt Will Perform Lower Body Dressing: with supervision Pt Will Transfer to Toilet: with supervision Pt Will Perform Toileting - Clothing Manipulation and hygiene: with supervision Pt Will Perform Tub/Shower Transfer: with supervision;tub bench  OT Frequency: Min 2X/week   Barriers to D/C: Decreased caregiver support             AM-PAC PT "6 Clicks" Daily Activity     Outcome Measure Help from another person eating meals?: A Little Help from another person taking care of personal grooming?: A  Little Help from another person toileting, which includes using toliet, bedpan, or urinal?: A Little Help from another person bathing (including washing, rinsing, drying)?: A Little Help from another person to put on and taking off regular upper body clothing?: A Little Help from another person to put on and taking off regular lower body clothing?: A Little 6 Click Score: 18   End of Session Nurse Communication: Mobility status  Activity Tolerance: Patient tolerated treatment well Patient left: in bed;with call bell/phone within reach;with bed alarm set;with SCD's reapplied  OT Visit Diagnosis: Muscle weakness (generalized) (M62.81);Unsteadiness on feet (R26.81)                Time: 4098-1191 OT Time Calculation (min): 24 min Charges:  OT General Charges $OT Visit: 1 Procedure OT Evaluation $OT Eval Moderate Complexity: 1 Procedure OT Treatments $Self Care/Home Management : 8-22 mins G-Codes:       Alen Bleacher, MS, OTR/L, CBIS 04/06/2017, 2:30 PM

## 2017-04-06 NOTE — Evaluation (Signed)
Physical Therapy Evaluation Patient Details Name: [name] MRN: [id number] DOB: [birth year] Today's Date: 04/06/2017   History of Present Illness  Mr. Kuzel is a 56yo male with PMH of alcohol abuse presenting to [facility] at prompting of his girlfriend for 3 days of diarrhea. HE does endorse multi-year history of drinking about 60oz of beer a day but quit on day of symptom onset.  Clinical Impression  Pt with inconsistent report of PLOF compared to OT eval. Pt very unsteady during ambulation and demonstrates increased falls risk as indicated by score of 14 on DGI. Pt with noted impaired vision and co-ordination deficits. Pt requires minA for safe mobility and ADls at this time. IF 24/7 supervision can not be provided pt may need ST-SNF to achieve safe mod I level of function.    Follow Up Recommendations Home health PT;Supervision/Assistance - 24 hour    Equipment Recommendations   (TBD)    Recommendations for Other Services       Precautions / Restrictions Precautions Precautions: Fall Precaution Comments: impaired vision, disconjugate eyes, difficulty tracking Restrictions Weight Bearing Restrictions: No      Mobility  Bed Mobility Overal bed mobility: Needs Assistance Bed Mobility: Supine to Sit;Sit to Supine   Sidelying to sit: Supervision;HOB elevated Supine to sit: Supervision Sit to supine: Supervision   General bed mobility comments: pt required directional v/c's to complete task, increased time  Transfers Overall transfer level: Needs assistance Equipment used: 1 person hand held assist Transfers: Sit to/from Stand Sit to Stand: Min assist;Min guard Stand pivot transfers: Min guard       General transfer comment: pt very unsteady, guarded and slow. pt reports "i don't need your help" but is reaching for objects to hold onto  Ambulation/Gait Ambulation/Gait assistance: Min assist Ambulation Distance (Feet): 150 Feet Assistive device: None Gait  Pattern/deviations: Step-through pattern;Decreased stride length;Narrow base of support;Staggering right;Staggering left;Scissoring   Gait velocity interpretation: Below normal speed for age/gender General Gait Details: pt with cross over gait pattern with multiple episodes of instabiltiy and LOB requiring minA to maintain balance.   Stairs Stairs: Yes Stairs assistance: Min assist Stair Management: One rail Left Number of Stairs: 1 General stair comments: very unsteady  Wheelchair Mobility    Modified Rankin (Stroke Patients Only)       Balance Overall balance assessment: Needs assistance Sitting-balance support: Feet supported Sitting balance-Leahy Scale: Good     Standing balance support: During functional activity;Single extremity supported Standing balance-Leahy Scale: Fair                   Standardized Balance Assessment Standardized Balance Assessment : Dynamic Gait Index   Dynamic Gait Index Level Surface: Mild Impairment Change in Gait Speed: Mild Impairment Gait with Horizontal Head Turns: Moderate Impairment Gait with Vertical Head Turns: Moderate Impairment Gait and Pivot Turn: Mild Impairment Step Over Obstacle: Mild Impairment Step Around Obstacles: Mild Impairment Steps: Mild Impairment Total Score: 14       Pertinent Vitals/Pain Pain Assessment: No/denies pain    Home Living Family/patient expects to be discharged to:: Private residence Living Arrangements: Spouse/significant other Available Help at Discharge: Family;Available PRN/intermittently (girlfriend cares for elderly people) Type of Home: Apartment Home Access: Stairs to enter Entrance Stairs-Rails: Right Entrance Stairs-Number of Steps: 1 Home Layout: One level;Full bath on main level Home Equipment: Cane - quad;Cane - single point      Prior Function Level of Independence: Independent         Comments: patient reports  he works for 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 Pt also states he walk 55 min to work because he doesn't drive and [place] doesn' thave a bus that will take him to work.     Hand Dominance   Dominant Hand: Right    Extremity/Trunk Assessment   Upper Extremity Assessment Upper Extremity Assessment: Generalized weakness (with impaired co-ordination however may be vision deficits)    Lower Extremity Assessment Lower Extremity Assessment:  (noted co-ordination deficits but strength 5/5)    Cervical / Trunk Assessment Cervical / Trunk Assessment: Normal  Communication   Communication: Expressive difficulties  Cognition Arousal/Alertness: Awake/alert Behavior During Therapy: Impulsive Overall Cognitive Status: No family/caregiver present to determine baseline cognitive functioning Area of Impairment: Safety/judgement;Awareness;Problem solving                         Safety/Judgement: Decreased awareness of safety;Decreased awareness of deficits Awareness: Intellectual Problem Solving: Slow processing;Decreased initiation;Difficulty sequencing;Requires verbal cues;Requires tactile cues General Comments: pt with inconsistent report of PLOF compared to OT evaluation.       General Comments      Exercises     Assessment/Plan    PT Assessment Patient needs continued PT services  PT Problem List Decreased strength;Decreased activity tolerance;Decreased mobility;Decreased balance;Decreased coordination;Decreased cognition;Decreased knowledge of use of DME;Decreased safety awareness       PT Treatment Interventions DME instruction;Gait training;Stair training;Functional mobility training;Therapeutic activities;Therapeutic exercise;Balance training    PT Goals (Current goals can be found in the Care Plan section)  Acute Rehab PT Goals Patient Stated Goal: "i need to leave today to get ready for work tomorrow" PT Goal Formulation: With patient Time For Goal Achievement:  04/13/17 Potential to Achieve Goals: Good Additional Goals Additional Goal #1: Pt to score >19 on DGI to indicate minimal falls risk.    Frequency Min 3X/week   Barriers to discharge        Co-evaluation               AM-PAC PT "6 Clicks" Daily Activity  Outcome Measure Difficulty turning over in bed (including adjusting bedclothes, sheets and blankets)?: A Little Difficulty moving from lying on back to sitting on the side of the bed? : A Little Difficulty sitting down on and standing up from a chair with arms (e.g., wheelchair, bedside commode, etc,.)?: A Little Help needed moving to and from a bed to chair (including a wheelchair)?: A Little Help needed walking in hospital room?: A Little Help needed climbing 3-5 steps with a railing? : A Little 6 Click Score: 18    End of Session Equipment Utilized During Treatment: Gait belt Activity Tolerance: Patient tolerated treatment well Patient left: in bed;with call bell/phone within reach;with bed alarm set Nurse Communication: Mobility status PT Visit Diagnosis: Unsteadiness on feet (R26.81)    Time: 4782-95621550-1609 PT Time Calculation (min) (ACUTE ONLY): 19 min   Charges:   PT Evaluation $PT Eval Moderate Complexity: 1 Procedure     PT G CodesLewis Shock:        Sabriyah Wilcher, PT, DPT Pager #: [phone] Office #: [phone]   Noga Fogg M Hester Joslin 04/06/2017, 4:26 PM

## 2017-04-07 ENCOUNTER — Inpatient Hospital Stay (HOSPITAL_COMMUNITY): Payer: Self-pay

## 2017-04-07 DIAGNOSIS — W57XXXA Bitten or stung by nonvenomous insect and other nonvenomous arthropods, initial encounter: Secondary | ICD-10-CM

## 2017-04-07 DIAGNOSIS — G9340 Encephalopathy, unspecified: Secondary | ICD-10-CM | POA: Diagnosis present

## 2017-04-07 DIAGNOSIS — S30863A Insect bite (nonvenomous) of scrotum and testes, initial encounter: Secondary | ICD-10-CM

## 2017-04-07 DIAGNOSIS — E8809 Other disorders of plasma-protein metabolism, not elsewhere classified: Secondary | ICD-10-CM

## 2017-04-07 LAB — COMPREHENSIVE METABOLIC PANEL
ALBUMIN: 1.6 g/dL — AB (ref 3.5–5.0)
ALK PHOS: 72 U/L (ref 38–126)
ALT: 62 U/L (ref 17–63)
AST: 134 U/L — ABNORMAL HIGH (ref 15–41)
Anion gap: 8 (ref 5–15)
BUN: 32 mg/dL — ABNORMAL HIGH (ref 6–20)
CO2: 16 mmol/L — AB (ref 22–32)
Calcium: 7.7 mg/dL — ABNORMAL LOW (ref 8.9–10.3)
Chloride: 104 mmol/L (ref 101–111)
Creatinine, Ser: 1.52 mg/dL — ABNORMAL HIGH (ref 0.61–1.24)
GFR calc non Af Amer: 50 mL/min — ABNORMAL LOW (ref >60)
GFR, EST AFRICAN AMERICAN: 58 mL/min — AB (ref >60)
GLUCOSE: 133 mg/dL — AB (ref 65–99)
Potassium: 3.9 mmol/L (ref 3.5–5.1)
SODIUM: 128 mmol/L — AB (ref 135–145)
TOTAL PROTEIN: 4.1 g/dL — AB (ref 6.5–8.1)
Total Bilirubin: 0.7 mg/dL (ref 0.3–1.2)

## 2017-04-07 LAB — HIV 1/2 AB DIFFERENTIATION
HIV 1 Ab: NEGATIVE
HIV 2 AB: NEGATIVE
NOTE (HIV CONF MULTISPOT): NEGATIVE

## 2017-04-07 LAB — RNA QUALITATIVE: HIV 1 RNA Qualitative: 1

## 2017-04-07 LAB — CBC
HEMATOCRIT: 36.7 % — AB (ref 39.0–52.0)
HEMOGLOBIN: 13 g/dL (ref 13.0–17.0)
MCH: 33.2 pg (ref 26.0–34.0)
MCHC: 35.4 g/dL (ref 30.0–36.0)
MCV: 93.9 fL (ref 78.0–100.0)
Platelets: 54 10*3/uL — ABNORMAL LOW (ref 150–400)
RBC: 3.91 MIL/uL — ABNORMAL LOW (ref 4.22–5.81)
RDW: 14.2 % (ref 11.5–15.5)
WBC: 12.6 10*3/uL — ABNORMAL HIGH (ref 4.0–10.5)

## 2017-04-07 LAB — GLUCOSE, CAPILLARY: Glucose-Capillary: 97 mg/dL (ref 65–99)

## 2017-04-07 LAB — HIV ANTIBODY (ROUTINE TESTING W REFLEX): HIV SCREEN 4TH GENERATION: REACTIVE — AB

## 2017-04-07 MED ORDER — DOXYCYCLINE HYCLATE 100 MG PO TABS
100.0000 mg | ORAL_TABLET | Freq: Two times a day (BID) | ORAL | Status: DC
  Administered 2017-04-07 – 2017-04-09 (×5): 100 mg via ORAL
  Filled 2017-04-07 (×5): qty 1

## 2017-04-07 NOTE — Progress Notes (Signed)
   Subjective: He is lying in bed with minimal activity but no acute distress. He is wearing a condom catheter for urine collection. He continues to complain of low appetite and is eating minimal amounts mostly fruit. During bathing nursing staff noticed an engorged tick that was removed. He states this is the 3rd tick he has had found on him, last one 2 weeks ago by his wife. He continues to deny fever, although reports feeling a bit cold this morning. He has no recurrence of diarrhea.  Objective:  Vital signs in last 24 hours: Vitals:   04/06/17 1715 04/06/17 2056 04/07/17 0415 04/07/17 1045  BP: (!) 84/62 95/65 93/60  100/67  Pulse: (!) 102 (!) 107 99 86  Resp: 18 18 16 17   Temp: 98.9 F (37.2 C) 98.8 F (37.1 C) 99 F (37.2 C) 98 F (36.7 C)  TempSrc: Oral Oral Oral Oral  SpO2: 98% 98% 97% 99%  Weight:  127 lb 10.3 oz (57.9 kg)    Height:       GENERAL- Malnourished appearing man, lying in bed in no acute distress HEENT- Right eye deviated laterally CARDIAC- RRR, no murmurs, rubs or gallops RESP- CTAB, no wheezes or crackles ABDOMEN- Soft, nontender, no guarding or rebound EXTREMITIES- symmetric, no pedal edema SKIN- Warm, dry, No rash or lesion PSYCH- Flat affect  Assessment/Plan: #Possible tickborne illness Nursing staff found an engorged male deer tick located on the patient's scrotum. He reports on interview today multiple ticks discovered by his wife, most recently 2 weeks ago. Given this constellation of liver failure altered mental status and leukocytosis we will empirically start treatment with doxycycline 100 mg twice a day for a week as coverage for coverage for RMSF or erlichiosis.  #Acute encephalopathy He is a bit more clear today than 2 days ago but is still not at a normal baseline. We will need to consider CNS lesions at this time so we'll get a CT head for his persistent altered mental status. Hopefully he can continued to participate with PT/OT and nurses for  mobilization.  #AKI #Hyponatremia Cr 2.6 on admission; improving to 1.52 today. He does not have known baseline renal disease prior to admission. Stopping IV fluids today. We will start fluid restriction for his inappropriately low sodium as he now is clinically euvolemic or close to it.  #Hypoalbuminemia #Hepatic steatosis #Alcohol use disorder #Thrombocytopenia Transaminitis is slowly resolving continues to have thrombocytopenia which is likely a chronic problem for him. Albumin down to 1.6 today. More related to nutritional deficits then impaired synthetic function. Her continuing him. Treatment with lactulose try to titrate this to 3 bowel movements per day in case he has a component of hepatic encephalopathy.   Dispo: Anticipated discharge is pending further workup and clinical improvement, probably in several days.  Fuller Planhristopher W Tamyka Bezio, MD PGY-II Internal Medicine Resident Pager# [phone] 04/07/2017, 3:16 PM

## 2017-04-07 NOTE — Progress Notes (Signed)
Informed Dr. Dimple Caseyice that patient has an engorged tick on his scrotum. Informed MD that this was found during his bath. MD stated to remove the tick and he will come assess the bite wound.

## 2017-04-07 NOTE — Progress Notes (Signed)
Physical Therapy Treatment Patient Details Name: Jonathan FlattenJerome L Crane MRN: [id number] DOB: [birth year] Today's Date: 04/07/2017    History of Present Illness [name] is a 56yo male with PMH of alcohol abuse presenting to [facility] at prompting of his girlfriend for 3 days of diarrhea. HE does endorse multi-year history of drinking about 60oz of beer a day but quit on day of symptom onset.    PT Comments    Continuing work on functional mobility and activity tolerance, gait and balance; Noting improvements over yesterday's walk, with less dependence on UE support for balance and milder gait deviations; Plan to repeat DGI and work on stair training next session   Follow Up Recommendations  Home health PT;Supervision/Assistance - 24 hour;Other (comment) (may progress to not needing HHPT)     Equipment Recommendations  None recommended by PT    Recommendations for Other Services       Precautions / Restrictions Precautions Precautions: Fall Precaution Comments: impaired vision, disconjugate eyes, difficulty tracking    Mobility  Bed Mobility Overal bed mobility: Needs Assistance Bed Mobility: Supine to Sit   Sidelying to sit: Supervision (flat bed) Supine to sit: Supervision     General bed mobility comments: Increased time, cues to initiate  Transfers Overall transfer level: Needs assistance Equipment used: None Transfers: Sit to/from Stand Sit to Stand: Min guard (without physical contact)         General transfer comment: Less need for UE support today; slow and guarded, but seemingly improved  Ambulation/Gait Ambulation/Gait assistance: Min guard (without physical contact) Ambulation Distance (Feet): 200 Feet Assistive device: None Gait Pattern/deviations: Step-through pattern;Wide base of support     General Gait Details: Much improved from yesterday's walk; Gait deviations include overall wide base of support, and occasional erratic step width, but no scissoring noted;  no overt loss of balance either   Stairs            Wheelchair Mobility    Modified Rankin (Stroke Patients Only)       Balance     Sitting balance-Leahy Scale: Good       Standing balance-Leahy Scale: Fair                              Cognition Arousal/Alertness: Awake/alert Behavior During Therapy: WFL for tasks assessed/performed Overall Cognitive Status: No family/caregiver present to determine baseline cognitive functioning                                        Exercises      General Comments        Pertinent Vitals/Pain Pain Assessment: No/denies pain    Home Living                      Prior Function            PT Goals (current goals can now be found in the care plan section) Acute Rehab PT Goals PT Goal Formulation: With patient Time For Goal Achievement: 04/13/17 Potential to Achieve Goals: Good Progress towards PT goals: Progressing toward goals    Frequency    Min 3X/week      PT Plan Current plan remains appropriate    Co-evaluation              AM-PAC PT "6 Clicks" Daily Activity  Outcome  Measure  Difficulty turning over in bed (including adjusting bedclothes, sheets and blankets)?: None Difficulty moving from lying on back to sitting on the side of the bed? : None Difficulty sitting down on and standing up from a chair with arms (e.g., wheelchair, bedside commode, etc,.)?: A Little Help needed moving to and from a bed to chair (including a wheelchair)?: A Little Help needed walking in hospital room?: A Little Help needed climbing 3-5 steps with a railing? : A Little 6 Click Score: 20    End of Session Equipment Utilized During Treatment: Gait belt Activity Tolerance: Patient tolerated treatment well Patient left: in bed;with call bell/phone within reach;with bed alarm set Nurse Communication: Mobility status PT Visit Diagnosis: Unsteadiness on feet (R26.81)     Time:  1610-9604 PT Time Calculation (min) (ACUTE ONLY): 19 min  Charges:  $Gait Training: 8-22 mins                    G Codes:       Van Clines, PT  Acute Rehabilitation Services Pager [phone] Office [phone]    Levi Aland 04/07/2017, 4:32 PM

## 2017-04-07 NOTE — Progress Notes (Signed)
Subjective: [name] seemed to be in a similar state of health to what he has been the last several days. He was alert and oriented, but some of his responses to questions were no fully on target and he was somewhat distractible. He mentioned that he is still not eating solid food as we does not like the food that is provided for him and prefers his own food.   Objective: Vital signs in last 24 hours: Vitals:   04/06/17 1715 04/06/17 2056 04/07/17 0415 04/07/17 1045  BP: (!) 84/62 95/65 93/60  (!) 127/58  Pulse: (!) 102 (!) 107 99 (!) 40  Resp: 18 18 16 17   Temp: 98.9 F (37.2 C) 98.8 F (37.1 C) 99 F (37.2 C) 97.6 F (36.4 C)  TempSrc: Oral Oral Oral Oral  SpO2: 98% 98% 97% 96%  Weight:  57.9 kg (127 lb 10.3 oz)    Height:       Weight change: -5.74 kg (-12 lb 10.5 oz)  Physical Exam:  General: lethargic, alert and oriented x3, lying in bed in NAD   CV: RRR, no murmurs, rubs or gallops, no LE edema,  Pulm: No wheezes, rales or rhonchi, no increased work of breathing  Abd: NDNT, bowel sounds present  Neuro: alert and oriented x3, moves all extremities, no asterixis, moves and responds slowly  Assessment/Plan: Principal Problem:   Diarrhea Active Problems:   Chronic pancreatitis (HCC)   Thrombocytopenia (HCC)   AKI (acute kidney injury) (HCC)   Transaminitis   ETOH abuse   Increased anion gap metabolic acidosis   Hyponatremia  AKI/ hyponatremia: With continued administration of lactated ringers at 2375mL/ hr, creatinine was down again today to 1.52 from 1.64 yesterday. This is beginning to approach the normal range for creatinine and possible supports dehydration/ pre-renal etiology for the AKI. However, [name] Sodium has continued to fall. The patient's serum osmolarity was 289, indicating an isotonic hyponatremia.. Hyperglycemia is not a likely explanation due to the patient's reasonably low blood glucose. Other options could be pseudohyponatremia. It is possible that  the hyponatremia is due to chronically poor solute intake with resuscitation of fluid volume that is greater that electrolyte repletion. Alternatively intrinsic renal pathology or hepatorenal syndrome could be responsible for the decreased sodium. However, hepatorenal syndrome would not be supported by the apparent early phase of [name] cirrhosis.   h/o fatty liver, Alcohol use, Thrombocytopenia:  [name] thrombocytopenia and liver enzyme abnormalities could both potentially be explained by his history of alcohol consumption. However, since admission, his transaminases have dropped consistently. This combined with his normal bilirubin, non-prolonged PT and lack of ascites indicate a potentially early and reversible stage of this disease's progression. The patient's platelets are now increasing to their value on admission, but thrombocytopenia is still present, which could indicate splenic sequestration of platelets due to elevated portal pressures.  --Nalaxone administration could be helpful on discharge to maintain abstinence from alcohol  --monitor platelets with CBC qd --check LFTs qd --continue nutritional repletion, encouraging patient to try eating solid foods  --multivitamin with minerals --folic acid 1mg  qd --thiamine 100 mg qd  Mental Status Changes: Since admission, [name] has been slow to answer questions and despite being consistently alert and oriented, does not always answer questions in a linear fashion, displays some delay in both motor and verbal responses and has a degree of distractability. His history of alcohol use and potential for falls or other traumatic events is concerning in the setting of a  patient whose mental status is not improving. Alternatively or additively, uremia secondary to chronic liver pathology could be contributing to the clinical picture and is being managed with Lactulose. --Lactulose increased to 30g 3xd, increase until >1 stool / day   --head CT  without contrast to assess  Diet: Regular VTE Ppx: SCDs Code Status: FULL     This is a Psychologist, occupational Note.  The care of the patient was discussed with Dr. Dimple Casey and the assessment and plan formulated with their assistance.  Please see their attached note for official documentation of the daily encounter.   LOS: 3 days   Myra Rude, Medical Student 04/07/2017, 10:48 AM

## 2017-04-08 DIAGNOSIS — B882 Other arthropod infestations: Secondary | ICD-10-CM

## 2017-04-08 DIAGNOSIS — D72829 Elevated white blood cell count, unspecified: Secondary | ICD-10-CM

## 2017-04-08 DIAGNOSIS — K760 Fatty (change of) liver, not elsewhere classified: Secondary | ICD-10-CM

## 2017-04-08 DIAGNOSIS — Z6820 Body mass index (BMI) 20.0-20.9, adult: Secondary | ICD-10-CM

## 2017-04-08 HISTORY — DX: Other arthropod infestations: B88.2

## 2017-04-08 LAB — CBC WITH DIFFERENTIAL/PLATELET
BASOS ABS: 0 10*3/uL (ref 0.0–0.1)
BASOS PCT: 0 %
Band Neutrophils: 0 %
Blasts: 0 %
EOS PCT: 0 %
Eosinophils Absolute: 0 10*3/uL (ref 0.0–0.7)
HCT: 38.8 % — ABNORMAL LOW (ref 39.0–52.0)
Hemoglobin: 14.3 g/dL (ref 13.0–17.0)
LYMPHS ABS: 12.6 10*3/uL — AB (ref 0.7–4.0)
Lymphocytes Relative: 45 %
MCH: 34.5 pg — AB (ref 26.0–34.0)
MCHC: 36.9 g/dL — ABNORMAL HIGH (ref 30.0–36.0)
MCV: 93.5 fL (ref 78.0–100.0)
METAMYELOCYTES PCT: 0 %
MONO ABS: 4.7 10*3/uL — AB (ref 0.1–1.0)
MONOS PCT: 17 %
Myelocytes: 0 %
NEUTROS ABS: 10.6 10*3/uL — AB (ref 1.7–7.7)
NRBC: 0 /100{WBCs}
Neutrophils Relative %: 38 %
Other: 0 %
PLATELETS: UNDETERMINED 10*3/uL (ref 150–400)
Promyelocytes Absolute: 0 %
RBC: 4.15 MIL/uL — ABNORMAL LOW (ref 4.22–5.81)
RDW: 14.6 % (ref 11.5–15.5)
WBC: 27.9 10*3/uL — ABNORMAL HIGH (ref 4.0–10.5)

## 2017-04-08 LAB — GLUCOSE, CAPILLARY
Glucose-Capillary: 99 mg/dL (ref 65–99)
Glucose-Capillary: 162 mg/dL — ABNORMAL HIGH (ref 65–99)
Glucose-Capillary: 117 mg/dL — ABNORMAL HIGH (ref 65–99)

## 2017-04-08 LAB — CBC
HCT: 39.2 % (ref 39.0–52.0)
Hemoglobin: 13.8 g/dL (ref 13.0–17.0)
MCH: 33.1 pg (ref 26.0–34.0)
MCHC: 35.2 g/dL (ref 30.0–36.0)
MCV: 94 fL (ref 78.0–100.0)
PLATELETS: UNDETERMINED 10*3/uL (ref 150–400)
RBC: 4.17 MIL/uL — ABNORMAL LOW (ref 4.22–5.81)
RDW: 14.4 % (ref 11.5–15.5)
WBC: 27.7 10*3/uL — ABNORMAL HIGH (ref 4.0–10.5)

## 2017-04-08 LAB — COMPREHENSIVE METABOLIC PANEL
ALBUMIN: 2 g/dL — AB (ref 3.5–5.0)
ALT: 66 U/L — ABNORMAL HIGH (ref 17–63)
ANION GAP: 7 (ref 5–15)
AST: 121 U/L — ABNORMAL HIGH (ref 15–41)
Alkaline Phosphatase: 86 U/L (ref 38–126)
BILIRUBIN TOTAL: 0.8 mg/dL (ref 0.3–1.2)
BUN: 20 mg/dL (ref 6–20)
CHLORIDE: 103 mmol/L (ref 101–111)
CO2: 19 mmol/L — AB (ref 22–32)
CREATININE: 1.26 mg/dL — AB (ref 0.61–1.24)
Calcium: 7.7 mg/dL — ABNORMAL LOW (ref 8.9–10.3)
GFR calc Af Amer: >60 mL/min (ref >60)
GFR calc non Af Amer: >60 mL/min (ref >60)
GLUCOSE: 100 mg/dL — AB (ref 65–99)
POTASSIUM: 4.8 mmol/L (ref 3.5–5.1)
SODIUM: 129 mmol/L — AB (ref 135–145)
Total Protein: 4.8 g/dL — ABNORMAL LOW (ref 6.5–8.1)

## 2017-04-08 LAB — SAVE SMEAR

## 2017-04-08 LAB — TSH: TSH: 1.849 u[IU]/mL (ref 0.350–4.500)

## 2017-04-08 LAB — VITAMIN B12: Vitamin B-12: 537 pg/mL (ref 180–914)

## 2017-04-08 MED ORDER — ENSURE ENLIVE PO LIQD: 237.0000 mL | Freq: Two times a day (BID) | ORAL | Status: DC

## 2017-04-08 NOTE — Progress Notes (Signed)
Occupational Therapy Treatment Patient Details Name: [name] MRN: [id number] DOB: December 04, 1960 Today's Date: 04/08/2017    History of present illness [name] is a 56yo male with PMH of alcohol abuse presenting to [facility] at prompting of his girlfriend for 3 days of diarrhea. HE does endorse multi-year history of drinking about 60oz of beer a day but quit on day of symptom onset.   OT comments  Pt needed VC for safety.  Pt very concerned about getting back to work.  Follow Up Recommendations  Home health OT;Supervision/Assistance - 24 hour    Equipment Recommendations  Tub/shower bench       Precautions / Restrictions Precautions Precautions: Fall Precaution Comments: impaired vision, disconjugate eyes, difficulty tracking       Mobility Bed Mobility Overal bed mobility: Independent                Transfers Overall transfer level: Needs assistance   Transfers: Sit to/from Stand;Stand Pivot Transfers Sit to Stand: Min guard Stand pivot transfers: Min guard                ADL either performed or assessed with clinical judgement   ADL Overall ADL's : Needs assistance/impaired     Grooming: Min guard;Standing;Cueing for safety;Wash/dry hands Grooming Details (indicate cue type and reason): pt propped on sink to stabilize                 Toilet Transfer: Min guard;Ambulation;Cueing for safety   Toileting- Clothing Manipulation and Hygiene: Supervision/safety;Cueing for safety;Cueing for sequencing;Sit to/from stand       Functional mobility during ADLs: Minimal assistance General ADL Comments: pt with decreased safety awareness. 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                Cognition Arousal/Alertness: Awake/alert Behavior During Therapy: WFL for tasks assessed/performed Overall Cognitive Status: No family/caregiver present to determine baseline cognitive functioning Area of  Impairment: Safety/judgement                                                   Pertinent Vitals/ Pain       Pain Assessment: No/denies pain         Frequency  Min 2X/week        Progress Toward Goals  OT Goals(current goals can now be found in the care plan section)  Progress towards OT goals: Progressing toward goals     Plan Discharge plan remains appropriate       AM-PAC PT "6 Clicks" Daily Activity     Outcome Measure   Help from another person eating meals?: A Little Help from another person taking care of personal grooming?: A Little Help from another person toileting, which includes using toliet, bedpan, or urinal?: A Little Help from another person bathing (including washing, rinsing, drying)?: A Little Help from another person to put on and taking off regular upper body clothing?: A Little Help from another person to put on and taking off regular lower body clothing?: A Little 6 Click Score: 18    End of Session    OT Visit Diagnosis: Muscle weakness (generalized) (M62.81);Unsteadiness on feet (R26.81)   Activity Tolerance Patient tolerated treatment well   Patient Left in bed;with call bell/phone within reach  Nurse Communication Mobility status        Time: 1610-9604 OT Time Calculation (min): 16 min  Charges: OT General Charges $OT Visit: 1 Procedure OT Treatments $Self Care/Home Management : 8-22 mins  [place], [place] [phone]   Alba Cory 04/08/2017, 11:34 AM

## 2017-04-08 NOTE — Progress Notes (Signed)
Subjective: [name] appear more alert and active this morning. He was able to move to the side of his be without assistance to participate in the physical exam. He still mentioned that he has limited food intake over the last day, but again, also attested to feeling good. He did mention that he had several bowel movements yesterday.    Objective: Vital signs in last 24 hours: Vitals:   04/07/17 1724 04/07/17 2141 04/08/17 0603 04/08/17 1009  BP: 114/64 103/74 98/67 106/72  Pulse: 94 88 82 89  Resp: 18 18 17 18   Temp: 97.7 F (36.5 C) 98.2 F (36.8 C) 98.2 F (36.8 C) 98 F (36.7 C)  TempSrc: Oral Oral Oral Oral  SpO2: 99% 100% 100% 100%  Weight:  63.2 kg (139 lb 6.4 oz)    Height:       Physical Exam: General: Less lethargic than previous, more alert and oriented, able to participate in physcial exams  CV: RRR, no murmurs, rubs or gallops Abd: non-tender, non-distended +Bowel Sounds  Neuro: alert and oriented x3, moves all extremities and follows commands, no asterixis, CN VII, XI, XII intact, CN II-VI assesment limited by 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's scrotum. The presence of an engorged tick in the setting of thrombocytopenia, elevated transaminases, lactate dehydrogenase on presentation,alkaline phosphatase and elevated creatinine could all tie into an infection with ehrlichiosis. Yesterday, a course of doxycycline was started and will be continued.  Diffuse Cerebral Atrophy/ Leucocytosis:  Since hospitalization, [name] has been more lethargic and less responsive than would be predicted in a healthy 56 year old male. Inially, there was suspicion of a diagnosis of HIV. However, after an initial reactive sample, HIV-1 and 2 antibody testing was negative. Yesterday, we ordered a CT that displayed diffuse cerebral and cerebellar  atrophy with no focal lesions or signs of infarct or hemorrhage. The diffuse atrophy could be explained by excessive alcohol use. However, other possibilities should be explored. Aditionally, over the last two days [name]'s Lymphocyte count has been rising and is now 27.7. This does not fit will within the clinical picture of a tickborne illness and a CBC with differential and peripheral blood smear were obtained to investigate this finding. The peripheral smear showed elevations in neutrophils, monocytes and lymphocytes with smudge cells and atypical lymphocytes evident on the slide. Smudge cells, lymphocytosis and thromocytopenia as well as the diffuse cerebral atrophy due to a paraneoplastic syndrome could all be consistent with CLL. However, the time-frame makes this diagnosis unlikely. --monitor CBC to see if it returns to normal into antibiotic course    AKI/ hyponatremia: Creatine was reduced today to 1.26 from 1.52 showing continued improvement from the initial elevation. It is possible that this elevation was due to a tick-born infection. We will continue to monitor Creatinine as the course of Docycyline is continued.  -continue fluid restriction -monitor with daily Bments  Hepatitis and alcohol use:  [name] reports significant prior alcohol use and has evidence of early fibrotic changes in his liver based on past records. However, his normal PT and Bilirubin indicate normal synthetic function of the liver and a preliminary stage of any cirrhotic changes that might be present. Lactulose was initially started due to suspicion of hepatic encephalopathy, but with declining BUN, this is unlikely. --d/c lactulose  --Nalaxone administration could be helpful  on discharge to maintain abstinence from alcohol  --check LFTs qd --continue nutritional repletion, encouraging patient to try eating solid foods  --multivitamin with minerals --folic acid 1mg  qd --thiamine 100 mg qd   Diet:  Regular VTE Ppx: SCDs Code Status: FULL    This is a Psychologist, occupational Note.  The care of the patient was discussed with Dr. Samuella Cota and the assessment and plan formulated with their assistance.  Please see their attached note for official documentation of the daily encounter.   LOS: 4 days   Myra Rude, Medical Student 04/08/2017, 11:34 AM

## 2017-04-08 NOTE — Progress Notes (Signed)
   Subjective:  Patient states he feels like his normal self; he denies fevers, chills, abdominal pain, nausea, vomiting, dysphagia. He states he has started to eat some fruit and is not having problems with it, but is still mainly only drinking liquids. He has been walking to the bathroom and back which is an improvement.   Objective:  Vital signs in last 24 hours: Vitals:   04/07/17 1724 04/07/17 2141 04/08/17 0603 04/08/17 1009  BP: 114/64 103/74 98/67 106/72  Pulse: 94 88 82 89  Resp: '18 18 17 18  '$ Temp: 97.7 F (36.5 C) 98.2 F (36.8 C) 98.2 F (36.8 C) 98 F (36.7 C)  TempSrc: Oral Oral Oral Oral  SpO2: 99% 100% 100% 100%  Weight:  63.2 kg (139 lb 6.4 oz)    Height:       Constitutional: NAD, lying in bed CV: RRR, no murmurs, rubs of gallops appreciated, no LE edema  Resp: CTAB, no increased work of breathing Abd: Soft, NDNT, +BS Neuro: alert, orientedx3. Moves all extremities, follows commands, no asterixis; his weakness is improving  Assessment/Plan:  Principal Problem:   Diarrhea Active Problems:   Chronic pancreatitis (HCC)   Thrombocytopenia (HCC)   AKI (acute kidney injury) ([place])   Transaminitis   ETOH abuse   Increased anion gap metabolic acidosis   Hyponatremia   Acute encephalopathy  Tick borne illness Leukocytosis: Patient found with engorged tick located on patient's scrotum; per patient he has had multiple ticks in recent weeks. This would explain his transaminitis, thrombocytopenia, and hyponatremia. Patient had increase in leukocytosis today after starting treatment with doxycycline yesterday - this could represent inflammatory reaction to spirochete lysis, or could represent secondary infection. He is not having fevers, rash, abdominal pain, headaches, cough, or other signs of infection. HIV was false positive based on follow up studies; acute hepatitis panel was negative.  --continue doxycycline '100mg'$  BID --obtain differential from AM CBC --repeat  BCxs  Acute encephalopathy: Patient's mental status improving daily; it is unlikely this is due to hepatic encephalopathy as patient had hepatic steatosis on [place], which could at the most be early cirrhosis; his LFT's are improving with treatment of tick borne illness and he has maintained normal alk phos and bili throughout admission. His CT head showed progressive diffuse atrophy in cerebellum and cerebrum that is much more than expected for his age. This could be due to his chronic alcohol use or other metabolic/infectious etiologies. --f/u RPR, B12, folate, TSH --d/c lactulose  AKI Hyponatremia: Cr 2.6 on admission; improving to 1.3 today. Na 129 today.  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 As discussed above, current liver function abnormalities are likely from the current infection rather than his chronic hepatic insults. Patient has normal BMI at 20, but very low muscle mass throughout; his malnutrition is likely 2/2 his chronic alcohol use.  --folic acid, thiamine, multivitamin --protein supplement --follow CBCs --follow CMets --continue ambulating  Diet: Regular VTE ppx: SCD's Code: FULL  Dispo: Anticipated discharge in approximately 2-3 day(s).   Alphonzo Grieve, MD 04/08/2017, 10:13 AM Pager [phone]

## 2017-04-08 NOTE — Progress Notes (Signed)
Physical Therapy Treatment Patient Details Name: [name] MRN: [id number] DOB: [birth year] Today's Date: 04/08/2017    History of Present Illness [name] is a 56yo male with PMH of alcohol abuse presenting to [facility] at prompting of his girlfriend for 3 days of diarrhea. HE does endorse multi-year history of drinking about 60oz of beer a day but quit on day of symptom onset.    PT Comments    Noting some improvements in gait, as evidenced by increased score on Dynamic Gait Index; At this point, I still recommend cane for increased stability with amb; I doubt he will use one   Follow Up Recommendations  Home health PT;Supervision/Assistance - 24 hour;Other (comment) (may progress to not needing HHPT)     Equipment Recommendations  Cane    Recommendations for Other Services       Precautions / Restrictions Precautions Precautions: Fall Precaution Comments: impaired vision, disconjugate eyes, difficulty tracking    Mobility  Bed Mobility Overal bed mobility: Independent                Transfers Overall transfer level: Needs assistance Equipment used: None Transfers: Sit to/from Stand Sit to Stand: Supervision Stand pivot transfers: Min guard       General transfer comment: Less need for UE support today; slow and guarded, but seemingly improved  Ambulation/Gait Ambulation/Gait assistance: Min guard (without physical contact) Ambulation Distance (Feet): 200 Feet Assistive device: None Gait Pattern/deviations: Step-through pattern;Wide base of support     General Gait Details: Much improved from yesterday's walk; Gait deviations include overall wide base of support, and occasional erratic step width, but no scissoring noted; a few small losses of balance from which pt recovered with stepping strategy, without phsycial assist   Stairs Stairs: Yes   Stair Management: One rail Left Number of Stairs: 5 General stair comments: Dependent on UE support from  rail  Wheelchair Mobility    Modified Rankin (Stroke Patients Only)       Balance             Standing balance-Leahy Scale: Fair                   Standardized Balance Assessment Standardized Balance Assessment : Dynamic Gait Index   Dynamic Gait Index Level Surface: Mild Impairment Change in Gait Speed: Mild Impairment Gait with Horizontal Head Turns: Mild Impairment Gait with Vertical Head Turns: Mild Impairment Gait and Pivot Turn: Mild Impairment Step Over Obstacle: Mild Impairment Step Around Obstacles: Mild Impairment Steps: Mild Impairment Total Score: 16      Cognition Arousal/Alertness: Awake/alert Behavior During Therapy: WFL for tasks assessed/performed Overall Cognitive Status: No family/caregiver present to determine baseline cognitive functioning Area of Impairment: Safety/judgement                         Safety/Judgement: Decreased awareness of deficits            Exercises      General Comments        Pertinent Vitals/Pain Pain Assessment: No/denies pain    Home Living                      Prior Function            PT Goals (current goals can now be found in the care plan section) Acute Rehab PT Goals Patient Stated Goal: "I dont need a cane" PT Goal Formulation: With patient Time For  Goal Achievement: 04/13/17 Potential to Achieve Goals: Good Progress towards PT goals: Progressing toward goals    Frequency    Min 3X/week      PT Plan Current plan remains appropriate    Co-evaluation              AM-PAC PT "6 Clicks" Daily Activity  Outcome Measure  Difficulty turning over in bed (including adjusting bedclothes, sheets and blankets)?: None Difficulty moving from lying on back to sitting on the side of the bed? : None Difficulty sitting down on and standing up from a chair with arms (e.g., wheelchair, bedside commode, etc,.)?: None Help needed moving to and from a bed to chair  (including a wheelchair)?: None Help needed walking in hospital room?: A Little Help needed climbing 3-5 steps with a railing? : A Little 6 Click Score: 22    End of Session Equipment Utilized During Treatment: Gait belt Activity Tolerance: Patient tolerated treatment well Patient left: in bed;with call bell/phone within reach Nurse Communication: Mobility status PT Visit Diagnosis: Unsteadiness on feet (R26.81)     Time: 9562-1308 PT Time Calculation (min) (ACUTE ONLY): 15 min  Charges:  $Gait Training: 8-22 mins                    G Codes:       [name], PT  Acute Rehabilitation Services Pager [phone] Office [phone]    [name] 04/08/2017, 2:44 PM

## 2017-04-09 ENCOUNTER — Encounter: Payer: Self-pay | Admitting: Internal Medicine

## 2017-04-09 ENCOUNTER — Telehealth: Payer: Self-pay | Admitting: Internal Medicine

## 2017-04-09 DIAGNOSIS — A849 Tick-borne viral encephalitis, unspecified: Secondary | ICD-10-CM

## 2017-04-09 DIAGNOSIS — R740 Nonspecific elevation of levels of transaminase and lactic acid dehydrogenase [LDH]: Secondary | ICD-10-CM

## 2017-04-09 LAB — CBC WITH DIFFERENTIAL/PLATELET
BASOS ABS: 0.2 10*3/uL — AB (ref 0.0–0.1)
Basophils Relative: 1 %
EOS ABS: 0 10*3/uL (ref 0.0–0.7)
Eosinophils Relative: 0 %
HCT: 35.3 % — ABNORMAL LOW (ref 39.0–52.0)
HEMOGLOBIN: 12.3 g/dL — AB (ref 13.0–17.0)
LYMPHS PCT: 73 %
Lymphs Abs: 16.4 10*3/uL — ABNORMAL HIGH (ref 0.7–4.0)
MCH: 32.6 pg (ref 26.0–34.0)
MCHC: 34.8 g/dL (ref 30.0–36.0)
MCV: 93.6 fL (ref 78.0–100.0)
MONOS PCT: 9 %
Monocytes Absolute: 2 10*3/uL — ABNORMAL HIGH (ref 0.1–1.0)
NEUTROS PCT: 17 %
Neutro Abs: 3.8 10*3/uL (ref 1.7–7.7)
Platelets: 148 10*3/uL — ABNORMAL LOW (ref 150–400)
RBC: 3.77 MIL/uL — AB (ref 4.22–5.81)
RDW: 14.3 % (ref 11.5–15.5)
WBC: 22.4 10*3/uL — AB (ref 4.0–10.5)

## 2017-04-09 LAB — COMPREHENSIVE METABOLIC PANEL
ALBUMIN: 1.8 g/dL — AB (ref 3.5–5.0)
ALK PHOS: 79 U/L (ref 38–126)
ALT: 62 U/L (ref 17–63)
ANION GAP: 5 (ref 5–15)
AST: 97 U/L — AB (ref 15–41)
BILIRUBIN TOTAL: 0.9 mg/dL (ref 0.3–1.2)
BUN: 14 mg/dL (ref 6–20)
CALCIUM: 7.5 mg/dL — AB (ref 8.9–10.3)
CO2: 21 mmol/L — AB (ref 22–32)
Chloride: 105 mmol/L (ref 101–111)
Creatinine, Ser: 1.05 mg/dL (ref 0.61–1.24)
GFR calc Af Amer: >60 mL/min (ref >60)
GFR calc non Af Amer: >60 mL/min (ref >60)
GLUCOSE: 107 mg/dL — AB (ref 65–99)
POTASSIUM: 3.5 mmol/L (ref 3.5–5.1)
SODIUM: 131 mmol/L — AB (ref 135–145)
TOTAL PROTEIN: 4.5 g/dL — AB (ref 6.5–8.1)

## 2017-04-09 LAB — CULTURE, BLOOD (ROUTINE X 2)
CULTURE: NO GROWTH
CULTURE: NO GROWTH
SPECIAL REQUESTS: ADEQUATE
Special Requests: ADEQUATE

## 2017-04-09 LAB — LACTATE DEHYDROGENASE: LDH: 384 U/L — AB (ref 98–192)

## 2017-04-09 LAB — FOLATE RBC
Folate, Hemolysate: 410.8 ng/mL
Folate, RBC: 969 ng/mL (ref >498)
Hematocrit: 42.4 % (ref 37.5–51.0)

## 2017-04-09 LAB — RPR: RPR: NONREACTIVE

## 2017-04-09 LAB — PATHOLOGIST SMEAR REVIEW

## 2017-04-09 NOTE — Telephone Encounter (Signed)
TOC HFU 04/13/2017 @ 9:15am with [facility]

## 2017-04-09 NOTE — Progress Notes (Signed)
   Subjective:  Patient states he feels well today. He denies any complaints and was able to eat solids yesterday afternoon and this morning. Patient denies noticing a new rash since admission.   Objective:  Vital signs in last 24 hours: Vitals:   04/08/17 1711 04/08/17 2219 04/09/17 0436 04/09/17 0910  BP: 108/64 117/78 110/81 109/80  Pulse: 85 93 74 82  Resp: 18 17 17 16   Temp: 98 F (36.7 C) 98.6 F (37 C) 98.5 F (36.9 C) 98.1 F (36.7 C)  TempSrc: Oral Oral Oral Oral  SpO2: 99% 100% 98% 99%  Weight:  64.9 kg (143 lb)    Height:       Constitutional: NAD, lying in bed CV: RRR, no murmurs, rubs of gallops appreciated, no LE edema  Resp: CTAB, no increased work of breathing Abd: Soft, NDNT, +BS Neuro: alert, orientedx3. Moves all extremities, follows commands, no asterixis; his weakness is improving Skin: no rash or lesions  Assessment/Plan:  Principal Problem:   Acute encephalopathy Active Problems:   Thrombocytopenia (HCC)   AKI (acute kidney injury) (HCC)   Transaminitis   ETOH abuse   Hyponatremia   Tick-borne disease  Tick borne illness Leukocytosis: Patient continues to improve, with transaminases almost back to normal, platelets up to 148. He continues to be afebrile and without other signs of infection. Initial BCx are negative x5 days, and recent BCx is NGTD x1d. His leukocytosis is stable though he had an increase in absolute lymphocytes - there was concern for possible drug reaction however as stated, he has not had a rash, organ functions are improving, and he has remained afebrile.  --continue doxycycline 100mg  BID x3 more days --f/u BCx --discharge today  Acute encephalopathy: Patient's encephalopathy appears resolved; it was most likely a related to above problem. Other than CT head showing diffuse progressive atrophy, so far workup has been negative (TSH, RPR, B12 normal --f/u folate  AKI Hyponatremia: Cr 2.6 on admission; improving to 1.0  today. Na 131 today - again, likely related to tick borne illness and is improving.  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 As discussed above, current liver function abnormalities are likely from the current infection rather than his chronic hepatic insults. Patient has normal BMI at 20, but very low muscle mass throughout; his malnutrition is likely 2/2 his chronic alcohol use.  --folic acid, thiamine, multivitamin --protein supplement  Diet: Regular VTE ppx: SCD's Code: FULL  Dispo: Anticipated discharge in approximately today.  Addendum: Nursing informed me that patient had apparently eloped; his belongings are gone from his room and his IV was placed on the table. We had hoped that we would be able to provide him his antibiotics in hand. Charge nurse alerted security. I attempted reaching his emergency contacts multiple times without answer or option for voicemail; there was no separate phone number for patient apart from these. No pharmacy is listed on his chart. [facility] and will mail him the information.    Nyra MarketSvalina, Katrell Milhorn, MD 04/09/2017, 11:22 AM Pager [phone]

## 2017-04-09 NOTE — Progress Notes (Signed)
Subjective: [name] appeared well this morning. He was sitting up in bed and had eaten some of his breakfast. He did not endorse any fevers or pain in the last 24 hours and stated that he feels he is in his normal state of health. He appears to be at his base level of responsiveness, in contrast to earlier in the week when he was consistently lethargic and difficult to rouse. Also in contrast to earlier this week, he has expressed interest in leaving the hospital.    Objective: Vital signs in last 24 hours: Vitals:   04/08/17 1711 04/08/17 2219 04/09/17 0436 04/09/17 0910  BP: 108/64 117/78 110/81 109/80  Pulse: 85 93 74 82  Resp: 18 17 17 16   Temp: 98 F (36.7 C) 98.6 F (37 C) 98.5 F (36.9 C) 98.1 F (36.7 C)  TempSrc: Oral Oral Oral Oral  SpO2: 99% 100% 98% 99%  Weight:  64.9 kg (143 lb)    Height:       Weight change: 1.633 kg (3 lb 9.6 oz)  Physical Exam:  General: NAD, sitting up in bed, alert and oriented x3 CV: RRR, no Murmurs, Rubs or Gallops, no LE edema  Ab: Soft, Non tender, non-distended, bowel sounds present   Assessment/Plan: Principal Problem:   Acute encephalopathy Active Problems:   Thrombocytopenia (HCC)   AKI (acute kidney injury) (HCC)   Transaminitis   ETOH abuse   Hyponatremia   Tick-borne disease  Tick-born disease, transaminitis, AKI, Hyponatremia, Acute Encephalopathy, thrombocytopenia: Today was the second day since [name] was started on doxycycline to treat a presumed tick-born infection after a blood-engorged tick was found on his scrotum. Since the beginning of treatment, his Creatinine, AST, ALT, Platelet count and sodium levels have improved as well as his mental status and apparent vitality. The improvement in each lab value with treatment with Doxycycline suggests that these abnormalities were due to an acute tick-born infection and not due to chronic alcohol consumption.   Leucocytosis: [name] Leukocyte count dropped today from  22.4 to 27.7 with a rise in Lymphocytes from 10.6 to 16.4 and concomitant drops in Neutrophils and Monocytes Reported. There have been reports of Leucocytosis following leucopenia after there initiation of treatment of tick-born illness. However, since this leucocytosis began immediately after the initiation of doxycycline, it is also important to consider a drug reaction as the cause. With the atypical Lymphocytosis, DRESS Syndrome is a possibility. However, there is no Eosinophilia, no rash is evident and the patient appears afebrile and to be improving both clinically and as far as lab values. Because doxycycline is the most effective medication for treatment of tick-born illnesses and because the patient's leukocytosis is not typical of DRESS and because doxycycline has not been strongly associated with DRESS, it is reasonable to continue doxycycline until a 5 day course is completed and arrange for close outpatient follow-up to reevaluate the patient clinically and check blood counts. --continue 100 mg doxycycline BID  --arrange for outpatient follow up for lab check   Dispo: Anticipated Discharge today  AMA: Patient Eloped today before we could give him the rest of his antibiotic course or ensure follow up as an outpatient    This is a Psychologist, occupational Note.  The care of the patient was discussed with Dr. Samuella Cota and the assessment and plan formulated with their assistance.  Please see their attached note for official documentation of the daily encounter.   LOS: 5 days   Myra Rude, Medical Student  04/09/2017, 10:35 AM

## 2017-04-09 NOTE — Progress Notes (Signed)
This nurse went in to check on pt, pt was not in the room, IV was on the bedside table and pt gown was laying on the bed. Looked for pt in the bathroom, under the bed and in the hallway, pt was nowhere to be found. Charge nurse notified, security notified. MD notified.

## 2017-04-09 NOTE — Progress Notes (Signed)
04/09/2017 11:13 AM  Received notification from attending RN that patient was not in room. Alerted Security, description given. Spoke with MD who stated she would be calling family members of the patient. If no updates, we will discharge patient AMA.  Seriyah Collison [employer]Young BSN, RN-BC [facility] Phone [phone]

## 2017-04-09 NOTE — Discharge Summary (Signed)
Name: [name] MRN: [id number] DOB: 1960/12/22 56 y.o. PCP: System, Provider Not In  Date of Admission: 04/04/2017  6:18 AM Date of Discharge: 04/09/2017 Attending Physician: Tyson Alias, MD  Discharge Diagnosis: 1. Tick borne illness 2. AKI 3. ETOH use disorder Principal Problem:   Acute encephalopathy Active Problems:   Thrombocytopenia (HCC)   AKI (acute kidney injury) (HCC)   Transaminitis   ETOH abuse   Hyponatremia   Tick-borne disease  Discharge Medications: Allergies as of 04/09/2017   No Known Allergies     Medication List    You have not been prescribed any medications.     Disposition and follow-up:   [name] was discharged from [facility] in Stable condition.  At the hospital follow up visit please address:  1.   Tick borne illness: --There was miscommunication and patient left hospital before formal discharge could be done and prescription provided --we were planning on continuing doxycycline 100mg  BID for 3 more days; consider necessity of this --repeat CMET and CBC w diff - for status of transaminitis, leukocytosis (lymph predominant - concern for drug reaction), and AKI --there was some concern for drug reaction after starting doxycycline - any rash present, fever, hemodynamic instability?  Lymphocytosis: --there was concern for drug reaction after starting Doxycycline with new lymphocyte predominant leukocytosis. Smear showed smudge cells that are though to be reactive- path report states if lymphocytosis persists after acute course of illness, further hematological work up should be done.  ETOH use disorder: --patient quit drinking 3 days prior to admission; no withdrawal in hospital --has he remained sober? --interest in naloxone therapy?  AKI: --Cr to 1.05 at discharge.   2.  Labs / imaging needed at time of follow-up: CMET, CBC w/ diff and smear; retest HIV in about 3 months  3.  Pending labs/ test  needing follow-up: Folate, BCx 5/9 NGTD  Follow-up Appointments: Follow-up Information    [facility]. Go on 04/13/2017.   Why:  at 9:15am for hospital follow up and blood work Contact information: [address] [place] [address] [phone]          Hospital Course by problem list: Principal Problem:   Acute encephalopathy Active Problems:   Thrombocytopenia (HCC)   AKI (acute kidney injury) (HCC)   Transaminitis   ETOH abuse   Hyponatremia   Tick-borne disease   Tick borne illness Leukocytosis: Patient initially presented to [facility] with 3 days of water diarrhea which had resolved by time of admission. He had no other infectious symptoms at time of admission, was afebrile, without leukocytosis. Labs showed transaminitis, thombocytopenia, hyponatremia and mild anemia. Initially he was mildly tachycardic hyponatremia was thought to be due to lack of intake; his tachycardia improved with fluids, but hyponatremia did not respond as expected if it was due to low intake or SIADH. On day two of admission, patient had disorientation and had new overall listlessness to point of nursing placing condom cath as patient was unable to get to even bed-side commode. Nursing found an engorged deer tick attached to patient's scrotum; on discussion with patient, he has found a couple of ticks in the last couple of weeks. His signs and symptoms at this point were consistent with tick borne illness; we did not think that patient had such severity of liver dysfunction clinically and based on labs and imaging that would explain this constellation of findings. Patient was started on Doxycycline for treatment  of tick-borne disease and had significant improvement over the next couple of days in his mentation, lab abnormalities and ability to take care of himself. Day after starting doxycycline, patient had a new leukocytosis that was trending up on following days and was  lymphocyte predominant. There was concern for drug reaction but patient had no other signs or symptoms. On day of discharge, we were planning on continuing Doxycycline for 3 more days (to complete 5 day course) with follow up appointment in the [facility]; however, there was some confusion and patient left prior to official discharge. Unfortunately we were not able to contact him at home or emergency contact numbers after attempting several times that day. He did not have a pharmacy listed so prescription could not be sent. An appointment with our clinic was made for 5/14 and letter sent to address in chart about location and time of appointment.   As discussed above, there was concern for drug reaction after starting Doxycycline with new lymphocyte predominant leukocytosis. Smear showed smudge cells that are though to be reactive- path report states if lymphocytosis persists after acute course of illness, further hematological work up should be done.  Other sources of encephalopathy were worked up including CT head (advanced atrophy for age); TSH, folate, RPR, B12 were normal.  AKI: Patient presented after acute diarrheal illness and poor PO intake with Cr of 2.62. He was given fluids and responded well. There was some concern for rhabdomyolysis in setting of AKI, severe illness, and tinged red; CK was normal however and renal function was steadily improving with minimal support.   ETOH abuse: Patient with long history of alcohol abuse; he stated that he had quit drinking day diarrhea had started (3 days prior to admission). He denied prior withdrawals or seizures. Please assess if patient is still abstaining from alcohol use and whether he is interested in Naltrexone therapy.  False positive HIV: Patient had a reactive 4th gen HIV screen, though subsequent Ab and RNA qualitative studies were negative, leading to conclusion of false positive. His HIV screen should be repeated in about 3  months to ensure correct result.  Discharge Vitals:   BP 109/80 (BP Location: Left Arm)   Pulse 82   Temp 98.1 F (36.7 C) (Oral)   Resp 16   Ht 5\' 10"  (1.778 m)   Wt 64.9 kg (143 lb)   SpO2 99%   BMI 20.52 kg/m   Pertinent Labs, Studies, and Procedures:  CBC Latest Ref Rng & Units 04/09/2017 04/08/2017 04/08/2017  WBC 4.0 - 10.5 K/uL 22.4(H) - 27.7(H)  Hemoglobin 13.0 - 17.0 g/dL 12.3(L) - 13.8  Hematocrit 39.0 - 52.0 % 35.3(L) 42.4 39.2  Platelets 150 - 400 K/uL 148(L) - PLATELET CLUMPS NOTED ON SMEAR, UNABLE TO ESTIMATE   CMP Latest Ref Rng & Units 04/09/2017 04/08/2017 04/07/2017  Glucose 65 - 99 mg/dL 161(W107(H) 960(A100(H) 540(J133(H)  BUN 6 - 20 mg/dL 14 20 81(X32(H)  Creatinine 0.61 - 1.24 mg/dL 9.141.05 7.82(N1.26(H) 5.62(Z1.52(H)  Sodium 135 - 145 mmol/L 131(L) 129(L) 128(L)  Potassium 3.5 - 5.1 mmol/L 3.5 4.8 3.9  Chloride 101 - 111 mmol/L 105 103 104  CO2 22 - 32 mmol/L 21(L) 19(L) 16(L)  Calcium 8.9 - 10.3 mg/dL 7.5(L) 7.7(L) 7.7(L)  Total Protein 6.5 - 8.1 g/dL 3.0(Q4.5(L) 4.8(L) 4.1(L)  Total Bilirubin 0.3 - 1.2 mg/dL 0.9 0.8 0.7  Alkaline Phos 38 - 126 U/L 79 86 72  AST 15 - 41 U/L 97(H) 121(H) 134(H)  ALT 17 - 63 U/L 62 66(H) 62   RPR: nonreactive TSH: 1.849 Vit B12: 537 Folate: 969 Peripheral smear review: smudge cells  CXR 04/04/17: No acute cardiopulmonary process [place] abd RUQ 04/04/17: Steatosis, sludge in gallbladder Renal U/S 04/05/17: Both kidneys are normal in size without hydronephrosis. Trace perinephric fluid on the right CT head 04/07/17: Moderate, age advanced diffuse cerebral and cerebellar atrophy with progression.   Discharge Instructions: Discharge Instructions    Call MD for:  difficulty breathing, headache or visual disturbances    Complete by:  As directed    Call MD for:  extreme fatigue    Complete by:  As directed    Call MD for:  hives    Complete by:  As directed    Call MD for:  persistant dizziness or light-headedness    Complete by:  As directed    Call MD for:  persistant  nausea and vomiting    Complete by:  As directed    Call MD for:  redness, tenderness, or signs of infection (pain, swelling, redness, odor or green/yellow discharge around incision site)    Complete by:  As directed    Call MD for:  severe uncontrolled pain    Complete by:  As directed    Call MD for:  temperature >100.4    Complete by:  As directed    Diet - low sodium heart healthy    Complete by:  As directed    Increase activity slowly    Complete by:  As directed       Signed: Nyra Market, MD 04/09/2017, 1:51 PM   Pager [phone]

## 2017-04-13 ENCOUNTER — Ambulatory Visit: Payer: Self-pay

## 2017-04-13 LAB — CULTURE, BLOOD (ROUTINE X 2)
CULTURE: NO GROWTH
Culture: NO GROWTH
Special Requests: ADEQUATE

## 2017-04-24 ENCOUNTER — Encounter: Payer: Self-pay | Admitting: Internal Medicine

## 2017-04-24 ENCOUNTER — Ambulatory Visit (INDEPENDENT_AMBULATORY_CARE_PROVIDER_SITE_OTHER): Payer: Self-pay | Admitting: Internal Medicine

## 2017-04-24 VITALS — BP 129/80 | HR 83 | Temp 98.3°F | Wt 143.7 lb

## 2017-04-24 DIAGNOSIS — Z5189 Encounter for other specified aftercare: Secondary | ICD-10-CM

## 2017-04-24 DIAGNOSIS — B882 Other arthropod infestations: Secondary | ICD-10-CM

## 2017-04-24 DIAGNOSIS — D72829 Elevated white blood cell count, unspecified: Secondary | ICD-10-CM

## 2017-04-24 DIAGNOSIS — F1010 Alcohol abuse, uncomplicated: Secondary | ICD-10-CM

## 2017-04-24 DIAGNOSIS — E871 Hypo-osmolality and hyponatremia: Secondary | ICD-10-CM

## 2017-04-24 NOTE — Assessment & Plan Note (Signed)
Had lymphocyte dominant leukocytosis. Had some smudge cell on smear review by pathology. This could be 2/2 to drug reaction from doxy (not taking it anymore since discharge) or could be from the tick borne illness.  Will recheck CBC with diff today.

## 2017-04-24 NOTE — Assessment & Plan Note (Signed)
Had tick bites x3 before admission, was treated with doxy in the hospital.   Feeling well. No complaints at all. No further tick bites since discharge.

## 2017-04-24 NOTE — Patient Instructions (Signed)
I am glad you came to see us.  We will check some blood work.  Follow up with us regularly. Lets see you back in 3 months.  Call us earlier if you need anything.

## 2017-04-24 NOTE — Assessment & Plan Note (Signed)
Had hyponatremia in the hospital which could be 2/2 to tick borne illness, or could also be from his alcohol abuse (beer potomania).  Will recheck CMET today to trend sodium and also to trend LFTs (had resolution of LFT elevation before discharge but we will make sure it's still normal).

## 2017-04-24 NOTE — Assessment & Plan Note (Signed)
Continues to drink alcohol but has cut down to 40 oz beer every day now. Used to be lot more in the past  Encouraged him to cut down more.

## 2017-04-24 NOTE — Progress Notes (Signed)
   CC: hospital follow up for tick borne illness  HPI:  [name] is a 56 y.o. with pmh as listed below is here for hospital f/up for tick borne illness  Past Medical History:  Diagnosis Date  . ETOH abuse   . Tick-borne disease 04/08/2017    Admitted 5/5 to 5/10 with tick borne illness with  Hyponatremia, transaminits, low grade fever, thrombocytopenia, and found to have an engorged tick. Was treated with doxy with improvement of his sx. He eloped from the hospital but was mailed out discharge instruction. Labs in the hospital: HIV reactive but confirmation test negative. RNA negative. bcx negative. Had persistent hyponatremia, also had lymphocyte predominant leukocytosis, last WBC 22.4, hgb 12.3 on dicharge. plt 148. LFTs were normal on discharge.  He feels great, no fevers, rash, arthralgia, n/v, diarrhea, headache or any other sx. Did not take any more doxy. Not on any meds now.   Continues to drink alcohol but has cut down to 40 oz beer every day now. Used to be lot more in the past   Review of Systems:   Review of Systems  Constitutional: Negative for chills, fever and malaise/fatigue.  Respiratory: Negative for cough.   Cardiovascular: Negative for chest pain.  Gastrointestinal: Negative for abdominal pain, heartburn, nausea and vomiting.  Neurological: Negative for dizziness and headaches.  Psychiatric/Behavioral: Negative for depression.     Physical Exam:  Vitals:   04/24/17 1539  BP: 129/80  Pulse: 83  Temp: 98.3 F (36.8 C)  TempSrc: Oral  SpO2: 100%  Weight: 143 lb 11.2 oz (65.2 kg)   Physical Exam  Constitutional: He is oriented to person, place, and time. He appears well-developed. No distress.  Appears malnourished  HENT:  Head: Normocephalic and atraumatic.  Eyes: Conjunctivae are normal.  Cardiovascular: Normal rate and regular rhythm.  Exam reveals no gallop and no friction rub.   No murmur heard. Respiratory: Effort normal and breath sounds  normal. No respiratory distress.  Musculoskeletal: Normal range of motion. He exhibits no edema.  Neurological: He is alert and oriented to person, place, and time.  Skin: Skin is warm. No rash noted. He is not diaphoretic.  Psychiatric: He has a normal mood and affect.    Assessment & Plan:   See Encounters Tab for problem based charting.  Patient discussed with Dr. Oswaldo DoneVincent

## 2017-04-25 LAB — COMPREHENSIVE METABOLIC PANEL
ALBUMIN: 2.8 g/dL — AB (ref 3.5–5.5)
ALT: 32 IU/L (ref 0–44)
AST: 49 IU/L — ABNORMAL HIGH (ref 0–40)
Albumin/Globulin Ratio: 1 — ABNORMAL LOW (ref 1.2–2.2)
Alkaline Phosphatase: 108 IU/L (ref 39–117)
BUN/Creatinine Ratio: 6 — ABNORMAL LOW (ref 9–20)
BUN: 6 mg/dL (ref 6–24)
Bilirubin Total: 0.4 mg/dL (ref 0.0–1.2)
CALCIUM: 8.5 mg/dL — AB (ref 8.7–10.2)
CO2: 22 mmol/L (ref 18–29)
CREATININE: 0.93 mg/dL (ref 0.76–1.27)
Chloride: 106 mmol/L (ref 96–106)
GFR, EST AFRICAN AMERICAN: 106 mL/min/{1.73_m2} (ref >59)
GFR, EST NON AFRICAN AMERICAN: 92 mL/min/{1.73_m2} (ref >59)
GLUCOSE: 90 mg/dL (ref 65–99)
Globulin, Total: 2.9 g/dL (ref 1.5–4.5)
Potassium: 4.3 mmol/L (ref 3.5–5.2)
Sodium: 143 mmol/L (ref 134–144)
TOTAL PROTEIN: 5.7 g/dL — AB (ref 6.0–8.5)

## 2017-04-25 LAB — CBC WITH DIFFERENTIAL/PLATELET
BASOS ABS: 0 10*3/uL (ref 0.0–0.2)
Basos: 0 %
EOS (ABSOLUTE): 0 10*3/uL (ref 0.0–0.4)
EOS: 0 %
HEMATOCRIT: 37 % — AB (ref 37.5–51.0)
HEMOGLOBIN: 12.4 g/dL — AB (ref 13.0–17.7)
IMMATURE GRANS (ABS): 0 10*3/uL (ref 0.0–0.1)
Immature Granulocytes: 1 %
LYMPHS: 58 %
Lymphocytes Absolute: 3.6 10*3/uL — ABNORMAL HIGH (ref 0.7–3.1)
MCH: 33.1 pg — ABNORMAL HIGH (ref 26.6–33.0)
MCHC: 33.5 g/dL (ref 31.5–35.7)
MCV: 99 fL — ABNORMAL HIGH (ref 79–97)
MONOCYTES: 12 %
Monocytes Absolute: 0.8 10*3/uL (ref 0.1–0.9)
Neutrophils Absolute: 1.8 10*3/uL (ref 1.4–7.0)
Neutrophils: 29 %
Platelets: 160 10*3/uL (ref 150–379)
RBC: 3.75 x10E6/uL — AB (ref 4.14–5.80)
RDW: 14.8 % (ref 12.3–15.4)
WBC: 6.2 10*3/uL (ref 3.4–10.8)

## 2017-04-28 NOTE — Progress Notes (Signed)
[facility] Attending  Case discussed with Dr. Ahmed at the time of the visit.  We reviewed the resident's history and exam and pertinent patient test results.  I agree with the assessment, diagnosis, and plan of care documented in the resident's note. 

## 2017-05-06 NOTE — Telephone Encounter (Signed)
Pt seen on 04/24/2017 for HFU.[name], Jonathan Sonnenfeld Cassady6/6/20189:47 AM

## 2017-07-15 ENCOUNTER — Encounter: Payer: Self-pay | Admitting: Internal Medicine

## 2017-08-05 ENCOUNTER — Encounter: Payer: Self-pay | Admitting: Internal Medicine

## 2020-02-09 ENCOUNTER — Other Ambulatory Visit: Payer: Self-pay

## 2020-02-09 ENCOUNTER — Encounter (HOSPITAL_COMMUNITY): Payer: Self-pay

## 2020-02-09 ENCOUNTER — Observation Stay (HOSPITAL_COMMUNITY): Payer: Self-pay

## 2020-02-09 ENCOUNTER — Inpatient Hospital Stay (HOSPITAL_COMMUNITY)
Admission: EM | Admit: 2020-02-09 | Discharge: 2020-02-13 | DRG: 057 | Disposition: A | Discharge status: Home / Self Care | Payer: Self-pay | Attending: Internal Medicine | Admitting: Internal Medicine

## 2020-02-09 DIAGNOSIS — E871 Hypo-osmolality and hyponatremia: Secondary | ICD-10-CM | POA: Diagnosis present

## 2020-02-09 DIAGNOSIS — N289 Disorder of kidney and ureter, unspecified: Secondary | ICD-10-CM

## 2020-02-09 DIAGNOSIS — R7401 Elevation of levels of liver transaminase levels: Secondary | ICD-10-CM | POA: Diagnosis present

## 2020-02-09 DIAGNOSIS — R531 Weakness: Secondary | ICD-10-CM

## 2020-02-09 DIAGNOSIS — E861 Hypovolemia: Secondary | ICD-10-CM | POA: Diagnosis present

## 2020-02-09 DIAGNOSIS — Z8673 Personal history of transient ischemic attack (TIA), and cerebral infarction without residual deficits: Secondary | ICD-10-CM

## 2020-02-09 DIAGNOSIS — E860 Dehydration: Secondary | ICD-10-CM | POA: Diagnosis present

## 2020-02-09 DIAGNOSIS — G312 Degeneration of nervous system due to alcohol: Principal | ICD-10-CM | POA: Diagnosis present

## 2020-02-09 DIAGNOSIS — R270 Ataxia, unspecified: Secondary | ICD-10-CM

## 2020-02-09 DIAGNOSIS — D509 Iron deficiency anemia, unspecified: Secondary | ICD-10-CM | POA: Diagnosis present

## 2020-02-09 DIAGNOSIS — D539 Nutritional anemia, unspecified: Secondary | ICD-10-CM | POA: Diagnosis present

## 2020-02-09 DIAGNOSIS — F1010 Alcohol abuse, uncomplicated: Secondary | ICD-10-CM | POA: Diagnosis present

## 2020-02-09 DIAGNOSIS — N390 Urinary tract infection, site not specified: Secondary | ICD-10-CM | POA: Diagnosis present

## 2020-02-09 DIAGNOSIS — Z23 Encounter for immunization: Secondary | ICD-10-CM

## 2020-02-09 DIAGNOSIS — D696 Thrombocytopenia, unspecified: Secondary | ICD-10-CM | POA: Diagnosis present

## 2020-02-09 DIAGNOSIS — N179 Acute kidney failure, unspecified: Secondary | ICD-10-CM | POA: Diagnosis present

## 2020-02-09 DIAGNOSIS — Z20822 Contact with and (suspected) exposure to covid-19: Secondary | ICD-10-CM | POA: Diagnosis present

## 2020-02-09 LAB — URINALYSIS, ROUTINE W REFLEX MICROSCOPIC
Glucose, UA: 150 mg/dL — AB
Hgb urine dipstick: NEGATIVE
Ketones, ur: 5 mg/dL — AB
Nitrite: POSITIVE — AB
Protein, ur: 100 mg/dL — AB
Specific Gravity, Urine: 1.021 (ref 1.005–1.030)
WBC, UA: >50 WBC/hpf — ABNORMAL HIGH (ref 0–5)
pH: 5 (ref 5.0–8.0)

## 2020-02-09 LAB — BASIC METABOLIC PANEL
Anion gap: 10 (ref 5–15)
BUN: 10 mg/dL (ref 6–20)
CO2: 22 mmol/L (ref 22–32)
Calcium: 8 mg/dL — ABNORMAL LOW (ref 8.9–10.3)
Chloride: 99 mmol/L (ref 98–111)
Creatinine, Ser: 1.41 mg/dL — ABNORMAL HIGH (ref 0.61–1.24)
GFR calc Af Amer: >60 mL/min (ref >60)
GFR calc non Af Amer: 55 mL/min — ABNORMAL LOW (ref >60)
Glucose, Bld: 138 mg/dL — ABNORMAL HIGH (ref 70–99)
Potassium: 3.9 mmol/L (ref 3.5–5.1)
Sodium: 131 mmol/L — ABNORMAL LOW (ref 135–145)

## 2020-02-09 LAB — CBC
HCT: 32.4 % — ABNORMAL LOW (ref 39.0–52.0)
Hemoglobin: 10.7 g/dL — ABNORMAL LOW (ref 13.0–17.0)
MCH: 34.5 pg — ABNORMAL HIGH (ref 26.0–34.0)
MCHC: 33 g/dL (ref 30.0–36.0)
MCV: 104.5 fL — ABNORMAL HIGH (ref 80.0–100.0)
Platelets: 77 10*3/uL — ABNORMAL LOW (ref 150–400)
RBC: 3.1 MIL/uL — ABNORMAL LOW (ref 4.22–5.81)
RDW: 13.3 % (ref 11.5–15.5)
WBC: 15.9 10*3/uL — ABNORMAL HIGH (ref 4.0–10.5)
nRBC: 0 % (ref 0.0–0.2)

## 2020-02-09 LAB — ETHANOL: Alcohol, Ethyl (B): <10 mg/dL (ref <10)

## 2020-02-09 LAB — FOLATE: Folate: 13.1 ng/mL (ref >5.9)

## 2020-02-09 LAB — TSH: TSH: 1.827 u[IU]/mL (ref 0.350–4.500)

## 2020-02-09 LAB — VITAMIN B12: Vitamin B-12: 829 pg/mL (ref 180–914)

## 2020-02-09 MED ORDER — LORAZEPAM 1 MG PO TABS
1.0000 mg | ORAL_TABLET | ORAL | Status: AC | PRN
  Filled 2020-02-09: qty 2

## 2020-02-09 MED ORDER — ONDANSETRON HCL 4 MG/2ML IJ SOLN: 4.0000 mg | Freq: Four times a day (QID) | INTRAMUSCULAR | Status: DC | PRN

## 2020-02-09 MED ORDER — THIAMINE HCL 100 MG PO TABS: 100.0000 mg | ORAL_TABLET | Freq: Every day | ORAL | Status: DC

## 2020-02-09 MED ORDER — ONDANSETRON HCL 4 MG PO TABS: 4.0000 mg | ORAL_TABLET | Freq: Four times a day (QID) | ORAL | Status: DC | PRN

## 2020-02-09 MED ORDER — THIAMINE HCL 100 MG/ML IJ SOLN: 100.0000 mg | Freq: Every day | INTRAMUSCULAR | Status: DC

## 2020-02-09 MED ORDER — ACETAMINOPHEN 325 MG PO TABS
650.0000 mg | ORAL_TABLET | Freq: Four times a day (QID) | ORAL | Status: DC | PRN
  Administered 2020-02-11: 650 mg via ORAL
  Filled 2020-02-09: qty 2

## 2020-02-09 MED ORDER — FOLIC ACID 1 MG PO TABS
1.0000 mg | ORAL_TABLET | Freq: Every day | ORAL | Status: DC
  Administered 2020-02-10 – 2020-02-13 (×4): 1 mg via ORAL
  Filled 2020-02-09 (×4): qty 1

## 2020-02-09 MED ORDER — ACETAMINOPHEN 650 MG RE SUPP: 650.0000 mg | Freq: Four times a day (QID) | RECTAL | Status: DC | PRN

## 2020-02-09 MED ORDER — SODIUM CHLORIDE 0.9% FLUSH
3.0000 mL | Freq: Two times a day (BID) | INTRAVENOUS | Status: DC
  Administered 2020-02-09 – 2020-02-13 (×6): 3 mL via INTRAVENOUS

## 2020-02-09 MED ORDER — SODIUM CHLORIDE 0.9 % IV SOLN: INTRAVENOUS | Status: DC

## 2020-02-09 MED ORDER — THIAMINE HCL 100 MG/ML IJ SOLN
500.0000 mg | Freq: Three times a day (TID) | INTRAVENOUS | Status: AC
  Administered 2020-02-10 – 2020-02-12 (×9): 500 mg via INTRAVENOUS
  Filled 2020-02-09 (×9): qty 5

## 2020-02-09 MED ORDER — ADULT MULTIVITAMIN W/MINERALS CH
1.0000 | ORAL_TABLET | Freq: Every day | ORAL | Status: DC
  Administered 2020-02-10 – 2020-02-13 (×4): 1 via ORAL
  Filled 2020-02-09 (×4): qty 1

## 2020-02-09 MED ORDER — GADOBUTROL 1 MMOL/ML IV SOLN
6.5000 mL | Freq: Once | INTRAVENOUS | Status: AC | PRN
  Administered 2020-02-09: 6.5 mL via INTRAVENOUS

## 2020-02-09 MED ORDER — SODIUM CHLORIDE 0.9 % IV SOLN
1.0000 g | INTRAVENOUS | Status: DC
  Administered 2020-02-10 – 2020-02-13 (×4): 1 g via INTRAVENOUS
  Filled 2020-02-09 (×4): qty 10

## 2020-02-09 MED ORDER — LORAZEPAM 2 MG/ML IJ SOLN
1.0000 mg | INTRAMUSCULAR | Status: AC | PRN
  Administered 2020-02-12: 2 mg via INTRAVENOUS
  Filled 2020-02-09: qty 1

## 2020-02-09 MED ORDER — LORAZEPAM 1 MG PO TABS
0.0000 mg | ORAL_TABLET | Freq: Four times a day (QID) | ORAL | Status: AC
  Administered 2020-02-10: 2 mg via ORAL
  Administered 2020-02-10 – 2020-02-11 (×6): 1 mg via ORAL
  Filled 2020-02-09 (×6): qty 1

## 2020-02-09 MED ORDER — LORAZEPAM 1 MG PO TABS
0.0000 mg | ORAL_TABLET | Freq: Two times a day (BID) | ORAL | Status: DC
  Administered 2020-02-13: 1 mg via ORAL
  Filled 2020-02-09 (×2): qty 1

## 2020-02-09 NOTE — H&P (Signed)
History and Physical    KORREY SCHLEICHER XBD:[id number] DOB: Jan 18, 1961 DOA: 02/09/2020  PCP: Verdene Lennert, MD, no longer has PCP per patient report  Patient coming from: Home   Chief Complaint: Gait difficulty   HPI: [name] is a 59 y.o. male who denies any significant past medical history no admits to excessive daily alcohol use, presenting to emergency department at the direction of his employer for evaluation of gait difficulty.  Patient reports worsening then generalized weakness and ability to ambulate over the past couple weeks to month.  Was having difficulty walking today at work and was sent to the ED by his employer for evaluation of this.  He denies any falls, headache, change in vision or hearing, or focal numbness or weakness.  He also denies any recent fevers, chills, or respiratory symptoms.  Usually drinks a 40 ounce beer daily.  He was having loose stools without abdominal pain, melena, or hematochezia, but reports that he had a solid BM today.  He reports poor appetite, does not eat much, worse in the past couple weeks, but denies any abdominal pain or nausea.  ED Course: Upon arrival to the ED, patient is found to be afebrile, saturating well on room air, and with stable blood pressure.  Chemistry panel is notable for sodium of 131 and creatinine 1.41.  CBC features a leukocytosis to 15,900, microcytic anemia with hemoglobin 10.7, and thrombocytopenia with platelets 77,000.  Ethanol was undetectable, TSH was normal, and folate and B12 levels were both normal.  Urinalysis is nitrite positive with many bacteria and > 50 WBC/hpf.  MRI brain is negative for acute findings but notable for multiple scattered remote lacunar infarctions involving the right external capsule, bilateral thalami, and cerebellum, as well as markedly advanced cerebral atrophy for age.  Covid PCR screening test is in process.  Hospitalist consulted for admission.  Review of Systems:  All other systems  reviewed and apart from HPI, are negative.  Past Medical History:  Diagnosis Date  . ETOH abuse   . 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 HERNIA REPAIR       reports that he has never smoked. He has never used smokeless tobacco. He reports current alcohol use of about 7.0 standard drinks of alcohol per week. He reports that he does not use drugs.  No Known Allergies  Family History  Problem Relation Age of Onset  . Cirrhosis Neg Hx   . Liver disease Neg Hx   . Cancer Neg Hx      Prior to Admission medications   Not on File    Physical Exam: Vitals:   02/09/20 2045 02/09/20 2100 02/09/20 2115 02/09/20 2250  BP: 102/81 102/81 110/83 (!) 122/92  Pulse: 81 89 84 85  Resp: 17 19 17 18   Temp:    99.1 F (37.3 C)  TempSrc:    Oral  SpO2: 100% 100% 95% 94%    Constitutional: NAD, calm  Eyes: PERTLA, lids and conjunctivae normal ENMT: Mucous membranes are moist. Posterior pharynx clear of any exudate or lesions.   Neck: normal, supple, no masses, no thyromegaly Respiratory: clear to auscultation bilaterally, no wheezing, no crackles. No accessory muscle use.  Cardiovascular: S1 & S2 heard, regular rate and rhythm. No extremity edema.   Abdomen: No distension, no tenderness, soft. Bowel sounds active.  Musculoskeletal: no clubbing / cyanosis. No joint deformity upper and lower extremities.   Skin: no significant rashes,  lesions, ulcers. Warm, dry, well-perfused. Neurologic: Dysconjugate gaze, PERRL, no dysarthria or aphasia, no facial numbness or weakness. Sensation to light touch intact. Patellar DTRs normal. Strength 5/5 in all 4 limbs.   Psychiatric: Alert and oriented x 3. Pleasant and cooperative.    Labs and Imaging on Admission: I have personally reviewed following labs and imaging studies  CBC: Recent Labs  Lab 02/09/20 1557  WBC 15.9*  HGB 10.7*  HCT 32.4*  MCV 104.5*  PLT 77*   Basic Metabolic  Panel: Recent Labs  Lab 02/09/20 1557  NA 131*  K 3.9  CL 99  CO2 22  GLUCOSE 138*  BUN 10  CREATININE 1.41*  CALCIUM 8.0*   GFR: CrCl cannot be calculated (Unknown ideal weight.). Liver Function Tests: No results for input(s): AST, ALT, ALKPHOS, BILITOT, PROT, ALBUMIN in the last 168 hours. No results for input(s): LIPASE, AMYLASE in the last 168 hours. No results for input(s): AMMONIA in the last 168 hours. Coagulation Profile: No results for input(s): INR, PROTIME in the last 168 hours. Cardiac Enzymes: No results for input(s): CKTOTAL, CKMB, CKMBINDEX, TROPONINI in the last 168 hours. BNP (last 3 results) No results for input(s): PROBNP in the last 8760 hours. HbA1C: No results for input(s): HGBA1C in the last 72 hours. CBG: No results for input(s): GLUCAP in the last 168 hours. Lipid Profile: No results for input(s): CHOL, HDL, LDLCALC, TRIG, CHOLHDL, LDLDIRECT in the last 72 hours. Thyroid Function Tests: Recent Labs    02/09/20 1948  TSH 1.827   Anemia Panel: Recent Labs    02/09/20 1948  VITAMINB12 829  FOLATE 13.1   Urine analysis:    Component Value Date/Time   COLORURINE AMBER (A) 02/09/2020 1628   APPEARANCEUR CLOUDY (A) 02/09/2020 1628   LABSPEC 1.021 02/09/2020 1628   PHURINE 5.0 02/09/2020 1628   GLUCOSEU 150 (A) 02/09/2020 1628   HGBUR NEGATIVE 02/09/2020 1628   BILIRUBINUR SMALL (A) 02/09/2020 1628   KETONESUR 5 (A) 02/09/2020 1628   PROTEINUR 100 (A) 02/09/2020 1628   UROBILINOGEN 1.0 03/30/2011 0942   NITRITE POSITIVE (A) 02/09/2020 1628   LEUKOCYTESUR SMALL (A) 02/09/2020 1628   Sepsis Labs: @LABRCNTIP (procalcitonin:4,lacticidven:4) )No results found for this or any previous visit (from the past 240 hour(s)).   Radiological Exams on Admission: MR Brain W and Wo Contrast  Result Date: 02/09/2020 CLINICAL DATA:  Initial evaluation for acute ataxia, stroke suspected. EXAM: MRI HEAD WITHOUT AND WITH CONTRAST TECHNIQUE: Multiplanar,  multiecho pulse sequences of the brain and surrounding structures were obtained without and with intravenous contrast. CONTRAST:  6.41mL GADAVIST GADOBUTROL 1 MMOL/ML IV SOLN COMPARISON:  Prior head CT from 04/07/2017. FINDINGS: Brain: Diffuse prominence of the CSF containing spaces compatible with generalized cerebral atrophy, fairly advanced for patient age. Mild chronic microvascular ischemic change noted within the periventricular white matter. Encephalomalacia involving the posterior right external capsule compatible with chronic lacunar type infarct. Associated chronic hemosiderin staining. Few additional remote lacunar infarcts noted involving the bilateral thalami. Few tiny remote bilateral cerebellar infarcts noted. No abnormal foci of restricted diffusion to suggest acute or subacute ischemia. Gray-white matter differentiation maintained. No encephalomalacia to suggest chronic cortical infarction. No other evidence for acute or chronic intracranial hemorrhage. No mass lesion, midline shift or mass effect. No hydrocephalus or extra-axial fluid collection. Pituitary gland suprasellar region normal. Midline structures intact. No abnormal enhancement. Vascular: Major intracranial vascular flow voids are maintained. Skull and upper cervical spine: Craniocervical junction within normal limits. Degenerative spondylosis noted at C2-3  with resultant mild spinal stenosis. Bone marrow signal intensity within normal limits. No scalp soft tissue abnormality. Sinuses/Orbits: Globes orbital soft tissues demonstrate no acute finding. Asymmetric axial myopia noted on the right. Scattered mucosal thickening noted throughout the paranasal sinuses. Superimposed left maxillary sinus retention cyst. Trace bilateral mastoid effusions noted, of doubtful significance. Visualized nasopharynx within normal limits. Other: None. IMPRESSION: 1. No acute intracranial abnormality. 2. Multiple scattered remote lacunar infarcts involving  the right external capsule, bilateral thalami, and cerebellum. 3. Markedly advanced cerebral atrophy for age. Electronically Signed   By: Rise Mu M.D.   On: 02/09/2020 22:39    Assessment/Plan   1. Ataxia  - Presents with worsening gait difficulty over the past couple weeks to months, drinks EtOH excessively and has poor diet, MRI brain in ED with remote infarctions and atrophy, B12 and folate normal  - Check thiamine and mag levels, start IV thiamine once thiamine level drawn, consult with PT   2. History of CVA  - MRI brain negative for acute findings but notable for remote CVAs  - Check A1c and lipids, check EKG  - Check LFTs and consider statin; consider ASA though has low platelets and increased risk for GI bleeding with chronic alcohol use, would likely need PPI as well  3. Macrocytic anemia; thrombocytopenia  - Hgb is 10.7 with MCV 104.5 and platelets 77k  - No bleeding, B12 and folate levels are normal, ?secondary to bone marrow suppression from chronic alcohol abuse   4. Mild renal insufficiency  - SCr is 1.41 on admission  - He appears hypovolemic and will be hydrated with IVF overnight  - Repeat chem panel in am    5. Hyponatremia  - Serum sodium 131 in ED, not likely to be cause of his neuro sxs  - He reports recent loose stools, drinks excessive beer daily  - Hydrate with NS overnight, repeat chem panel in am    6. UTI  - Culture urine and treat with Rocephin for now    DVT prophylaxis: SCDs Code Status: Full  Family Communication: Discussed with patient  Disposition Plan: Likely back home, PT assessment pending Consults called: None Admission status: Observation     Briscoe Deutscher, MD [facility] Pager: See [email or web address]  If 7AM-7PM, please contact the daytime attending [email or web address]  02/09/2020, 11:36 PM

## 2020-02-09 NOTE — ED Triage Notes (Signed)
Pt reports bilateral leg weakness for the past month, pt states his boss sent him here because he wasn't walking right. Pt denies any pain in legs. Pt ambulatory.

## 2020-02-09 NOTE — ED Provider Notes (Signed)
[facility] EMERGENCY DEPARTMENT Provider Note   CSN: [id number] Arrival date & time: 02/09/20  1525     History Chief Complaint  Patient presents with  . Weakness    [name] is a 59 y.o. male.  60 year old male with prior medical history as detailed below presents for evaluation of reported weakness.  Patient reports gradual onset of generalized weakness and unsteady gait over the last 1 to 2 months.  Patient reports that his boss sent him to the ED since he can no longer work.  Patient does report significant alcohol use on a daily basis.  He reports using a 40 ounce every day.  Patient denies prior neurologic symptoms.  Of note, patient is without established primary care.  The history is provided by the patient and medical records.  Weakness Severity:  Moderate Onset quality:  Gradual Duration:  8 weeks Timing:  Constant Progression:  Worsening Chronicity:  New Context: alcohol use   Relieved by:  Nothing Worsened by:  Nothing      Past Medical History:  Diagnosis Date  . ETOH abuse   . 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 Hyponatremia   . ETOH abuse   . Leukocytosis 04/04/2017  . Transaminitis 04/04/2017    Past Surgical History:  Procedure Laterality Date  . HERNIA REPAIR         Family History  Problem Relation Age of Onset  . Cirrhosis Neg Hx   . Liver disease Neg Hx   . Cancer Neg Hx     Social History   Tobacco Use  . Smoking status: Never Smoker  . Smokeless tobacco: Never Used  Substance Use Topics  . Alcohol use: Yes    Alcohol/week: 7.0 standard drinks    Types: 7 Cans of beer per week    Comment: Drinks a "40 and a half per day  . Drug use: No    Home Medications Prior to Admission medications   Not on File    Allergies    Patient has no known allergies.  Review of Systems   Review of Systems  Neurological:  Positive for weakness.  All other systems reviewed and are negative.   Physical Exam Updated Vital Signs BP 99/87   Pulse (!) 107   Temp 98.5 F (36.9 C) (Oral)   Resp 16   SpO2 99%   Physical Exam Vitals and nursing note reviewed.  Constitutional:      General: He is not in acute distress.    Appearance: Normal appearance. He is well-developed.  HENT:     Head: Normocephalic and atraumatic.  Eyes:     Conjunctiva/sclera: Conjunctivae normal.     Pupils: Pupils are equal, round, and reactive to light.  Cardiovascular:     Rate and Rhythm: Normal rate and regular rhythm.     Heart sounds: Normal heart sounds.  Pulmonary:     Effort: Pulmonary effort is normal. No respiratory distress.     Breath sounds: Normal breath sounds.  Abdominal:     General: There is no distension.     Palpations: Abdomen is soft.     Tenderness: There is no abdominal tenderness.  Musculoskeletal:        General: No deformity. Normal range of motion.     Cervical back: Normal range of motion and neck supple.  Skin:    General: Skin is warm  and dry.  Neurological:     Mental Status: He is alert and oriented to person, place, and time.     Gait: Gait abnormal.     Comments: Wide-based ataxic gait noted on exam.  Patient is unstable on his feet and requires support.     ED Results / Procedures / Treatments   Labs (all labs ordered are listed, but only abnormal results are displayed) Labs Reviewed  BASIC METABOLIC PANEL - Abnormal; Notable for the following components:      Result Value   Sodium 131 (*)    Glucose, Bld 138 (*)    Creatinine, Ser 1.41 (*)    Calcium 8.0 (*)    GFR calc non Af Amer 55 (*)    All other components within normal limits  CBC - Abnormal; Notable for the following components:   WBC 15.9 (*)    RBC 3.10 (*)    Hemoglobin 10.7 (*)    HCT 32.4 (*)    MCV 104.5 (*)    MCH 34.5 (*)    Platelets 77 (*)    All other components within normal limits  URINALYSIS,  ROUTINE W REFLEX MICROSCOPIC - Abnormal; Notable for the following components:   Color, Urine AMBER (*)    APPearance CLOUDY (*)    Glucose, UA 150 (*)    Bilirubin Urine SMALL (*)    Ketones, ur 5 (*)    Protein, ur 100 (*)    Nitrite POSITIVE (*)    Leukocytes,Ua SMALL (*)    WBC, UA >50 (*)    Bacteria, UA MANY (*)    Non Squamous Epithelial 0-5 (*)    All other components within normal limits  SARS CORONAVIRUS 2 (TAT 6-24 HRS)  URINE CULTURE  ETHANOL  FOLATE  RPR  VITAMIN B12  TSH    EKG None  Radiology No results found.  Procedures Procedures (including critical care time)  Medications Ordered in ED Medications - No data to display  ED Course  I have reviewed the triage vital signs and the nursing notes.  Pertinent labs & imaging results that were available during my care of the patient were reviewed by me and considered in my medical decision making (see chart for details).    MDM Rules/Calculators/A&P                      MDM  Screen  complete  [name] was evaluated in Emergency Department on 02/09/2020 for the symptoms described in the history of present illness. He was evaluated in the context of the global COVID-19 pandemic, which necessitated consideration that the patient might be at risk for infection with the SARS-CoV-2 virus that causes COVID-19. Institutional protocols and algorithms that pertain to the evaluation of patients at risk for COVID-19 are in a state of rapid change based on information released by regulatory bodies including the CDC and federal and state organizations. These policies and algorithms were followed during the patient's care in the ED.  Patient is presenting with complaint of worsening weakness over the last 6 to 8 weeks.  Patient does have a significant alcohol use history reported.  Patient's presentation is concerning for possible cerebellar ataxia secondary to alcohol use.  That being said patient is without  established primary care.  Case discussed with neurology who agrees with initial plan for evaluation.  Dr. Myna Hidalgo of the hospitalist service is aware of case and will evaluate for admission.   Final Clinical  Impression(s) / ED Diagnoses Final diagnoses:  Weakness  Ataxia    Rx / DC Orders ED Discharge Orders    None       Wynetta Fines, MD 02/09/20 2024

## 2020-02-10 LAB — HEPATIC FUNCTION PANEL
ALT: 24 U/L (ref 0–44)
AST: 53 U/L — ABNORMAL HIGH (ref 15–41)
Albumin: 2 g/dL — ABNORMAL LOW (ref 3.5–5.0)
Alkaline Phosphatase: 129 U/L — ABNORMAL HIGH (ref 38–126)
Bilirubin, Direct: 1.1 mg/dL — ABNORMAL HIGH (ref 0.0–0.2)
Indirect Bilirubin: 1.2 mg/dL — ABNORMAL HIGH (ref 0.3–0.9)
Total Bilirubin: 2.3 mg/dL — ABNORMAL HIGH (ref 0.3–1.2)
Total Protein: 5.4 g/dL — ABNORMAL LOW (ref 6.5–8.1)

## 2020-02-10 LAB — COMPREHENSIVE METABOLIC PANEL
ALT: 24 U/L (ref 0–44)
AST: 51 U/L — ABNORMAL HIGH (ref 15–41)
Albumin: 2.1 g/dL — ABNORMAL LOW (ref 3.5–5.0)
Alkaline Phosphatase: 135 U/L — ABNORMAL HIGH (ref 38–126)
Anion gap: 11 (ref 5–15)
BUN: 11 mg/dL (ref 6–20)
CO2: 22 mmol/L (ref 22–32)
Calcium: 7.5 mg/dL — ABNORMAL LOW (ref 8.9–10.3)
Chloride: 101 mmol/L (ref 98–111)
Creatinine, Ser: 1.15 mg/dL (ref 0.61–1.24)
GFR calc Af Amer: >60 mL/min (ref >60)
GFR calc non Af Amer: >60 mL/min (ref >60)
Glucose, Bld: 91 mg/dL (ref 70–99)
Potassium: 3.7 mmol/L (ref 3.5–5.1)
Sodium: 134 mmol/L — ABNORMAL LOW (ref 135–145)
Total Bilirubin: 2.2 mg/dL — ABNORMAL HIGH (ref 0.3–1.2)
Total Protein: 5.6 g/dL — ABNORMAL LOW (ref 6.5–8.1)

## 2020-02-10 LAB — LIPID PANEL
Cholesterol: 61 mg/dL (ref 0–200)
HDL: 27 mg/dL — ABNORMAL LOW (ref >40)
LDL Cholesterol: 24 mg/dL (ref 0–99)
Total CHOL/HDL Ratio: 2.3 RATIO
Triglycerides: 50 mg/dL (ref <150)
VLDL: 10 mg/dL (ref 0–40)

## 2020-02-10 LAB — HEMOGLOBIN A1C
Hgb A1c MFr Bld: 4.6 % — ABNORMAL LOW (ref 4.8–5.6)
Mean Plasma Glucose: 85.32 mg/dL

## 2020-02-10 LAB — RPR: RPR Ser Ql: NONREACTIVE

## 2020-02-10 LAB — HIV ANTIBODY (ROUTINE TESTING W REFLEX): HIV Screen 4th Generation wRfx: REACTIVE — AB

## 2020-02-10 LAB — SARS CORONAVIRUS 2 (TAT 6-24 HRS): SARS Coronavirus 2: NEGATIVE

## 2020-02-10 LAB — MAGNESIUM: Magnesium: 2 mg/dL (ref 1.7–2.4)

## 2020-02-10 LAB — PHOSPHORUS: Phosphorus: 1.7 mg/dL — ABNORMAL LOW (ref 2.5–4.6)

## 2020-02-10 MED ORDER — CALCIUM CARBONATE 1250 (500 CA) MG PO TABS
1.0000 | ORAL_TABLET | Freq: Two times a day (BID) | ORAL | Status: DC
  Administered 2020-02-10 – 2020-02-13 (×7): 500 mg via ORAL
  Filled 2020-02-10 (×7): qty 1

## 2020-02-10 NOTE — TOC Initial Note (Addendum)
Transition of Care [facility]) - Initial/Assessment Note    Patient Details  Name: [name] MRN: [id number] Date of Birth: Apr 07, 1961  [facility]) CM/SW Contact:    Kermit Balo, RN Phone Number: 02/10/2020, 4:02 PM  Clinical Narrative:                 Pt from home with girlfriend and her son.Pt states they can provide 24 hour supervision. Pt without a PCP or insurance. Pt interested in getting set up with one of the [facility]. CM place appt on the AVS. Pt can also use [facility] for assistance with his medications.  Pt will need a walker prior to d/c home.  Recommendations are for CIR.  TOC following.  Pt provided resources for online, inpatient and outpatient counseling for substance abuse.   Expected Discharge Plan: IP Rehab Facility Barriers to Discharge: Inadequate or no insurance, Continued Medical Work up   Patient Goals and CMS Choice     Choice offered to / list presented to : Patient  Expected Discharge Plan and Services Expected Discharge Plan: IP Rehab Facility   Discharge Planning Services: CM Consult Post Acute Care Choice: IP Rehab                                        Prior Living Arrangements/Services   Lives with:: Significant Other Patient language and need for interpreter reviewed:: Yes Do you feel safe going back to the place where you live?: Yes        Care giver support system in place?: Yes (comment) Current home services: DME(cane) Criminal Activity/Legal Involvement Pertinent to Current Situation/Hospitalization: No - Comment as needed  Activities of Daily Living      Permission Sought/Granted                  Emotional Assessment Appearance:: Appears stated age Attitude/Demeanor/Rapport: Engaged Affect (typically observed): Accepting Orientation: : Oriented to Self, Oriented to Place, Oriented to  Time, Oriented to Situation Alcohol / Substance Use: Alcohol Use Psych Involvement: No  (comment)  Admission diagnosis:  Ataxia [R27.0] Weakness [R53.1] Patient Active Problem List   Diagnosis Date Noted  . Ataxia 02/09/2020  . Thrombocytopenia (HCC) 02/09/2020  . Macrocytic anemia 02/09/2020  . Mild renal insufficiency 02/09/2020  . History of CVA (cerebrovascular accident) 02/09/2020  . Acute lower UTI 02/09/2020  . 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 Hyponatremia   . ETOH abuse   . Leukocytosis 04/04/2017  . Elevated transaminase level 04/04/2017   PCP:  Verdene Lennert, MD Pharmacy:   [facility] [phone], [place] - [address] [place] [address] Phone: [phone] Fax: [phone]     Social Determinants of Health (SDOH) Interventions    Readmission Risk Interventions No flowsheet data found.

## 2020-02-10 NOTE — Evaluation (Signed)
Physical Therapy Evaluation Patient Details Name: [name] MRN: [id number] DOB: 1961-06-13 Today's Date: 02/10/2020   History of Present Illness  Pt is a 59 y.o. M who denies significant PMH but admits to excessive daily alcohol use who presents for evaluation of gait difficulty over past month. MRI negative for acute findings but notable for remote CVAs. Pt also found to have thrombocytopenia, hyponatremia and UTI.  Clinical Impression  Prior to admission, pt lives in an apartment with his girlfriend and works as a Development worker, international aid. Pt reports one month of gait difficulty, but denies history of falls. On PT evaluation, pt presents with ataxic gait, poor balance, upper extremity weakness and dysmetria, and decreased coordination. Additionally, pt with flat affect and decreased cognition including poor awareness and decreased short term memory. Pt with 0/3 short term memory recall and no awareness of deficits, stating, "my balance is good." Pt requiring moderate assist for limited ambulation with no assistive device; is able to progress to a min assist level with use of walker. Without assistive device, pt staggering right/left and posteriorly, requiring use of gait belt to steady. Presents as a high fall risk based on these deficits. Recommending post acute rehab to address.       Follow Up Recommendations CIR;Supervision/Assistance - 24 hour    Equipment Recommendations  Rolling walker with 5" wheels    Recommendations for Other Services   OT consult; Rehab consult    Precautions / Restrictions Precautions Precautions: Fall Restrictions Weight Bearing Restrictions: No      Mobility  Bed Mobility Overal bed mobility: Needs Assistance Bed Mobility: Supine to Sit;Sit to Supine     Supine to sit: Supervision Sit to supine: Supervision      Transfers Overall transfer level: Needs assistance Equipment used: None Transfers: Sit to/from Stand Sit to Stand: Min guard             Ambulation/Gait Ambulation/Gait assistance: Min assist;Mod assist Gait Distance (Feet): 120 Feet Assistive device: Rolling walker (2 wheeled);None Gait Pattern/deviations: Step-through pattern;Decreased stride length;Staggering right;Staggering left;Ataxic Gait velocity: decreased   General Gait Details:    Financial trader Rankin (Stroke Patients Only)       Balance Overall balance assessment: Needs assistance Sitting-balance support: Feet supported Sitting balance-Leahy Scale: Good     Standing balance support: No upper extremity supported;During functional activity Standing balance-Leahy Scale: Fair Standing balance comment: Fair statically, poor dynamically                             Pertinent Vitals/Pain Pain Assessment: Faces Faces Pain Scale: No hurt    Home Living Family/patient expects to be discharged to:: Private residence Living Arrangements: Other (Comment)(girlfriend) Available Help at Discharge: Available PRN/intermittently;Other (Comment)(girlfriend) Type of Home: Apartment Home Access: Stairs to enter Entrance Stairs-Rails: Right;Left(through front entrance) Entrance Stairs-Number of Steps: 6 Home Layout: One level Home Equipment: None      Prior Function Level of Independence: Independent         Comments: Works in Biomedical scientist, 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 Does not drive, reports employer picks him up for work     Hand Dominance        Extremity/Trunk Assessment   Upper Extremity Assessment Upper Extremity Assessment: RUE deficits/detail;LUE deficits/detail RUE Deficits / Details: Grossly 4/5, Dysmetric RUE Coordination: decreased fine motor;decreased gross motor LUE Deficits / Details: Grossly 4/5, Dysmetric LUE Coordination:  decreased fine motor;decreased gross motor    Lower Extremity Assessment Lower Extremity Assessment: LLE deficits/detail;RLE deficits/detail RLE Deficits / Details:  Strength 5/5 LLE Deficits / Details: Strength 5/5    Cervical / Trunk Assessment Cervical / Trunk Assessment: Normal  Communication   Communication: Other (comment)(slow speech (unsure of baseline))  Cognition   Behavior During Therapy: Flat affect Overall Cognitive Status: Impaired/Different from baseline Area of Impairment: Memory;Safety/judgement;Awareness                     Memory: Decreased short-term memory   Safety/Judgement: Decreased awareness of safety;Decreased awareness of deficits Awareness: Intellectual   General Comments: Pt A&Ox3. 0/3 short term memory recall. No awareness of deficits, reporting, "my balance is good."      General Comments      Exercises     Assessment/Plan    PT Assessment Patient needs continued PT services  PT Problem List Decreased strength;Decreased activity tolerance;Decreased mobility;Decreased balance;Decreased coordination;Decreased cognition;Decreased safety awareness       PT Treatment Interventions DME instruction;Gait training;Stair training;Functional mobility training;Therapeutic activities;Therapeutic exercise;Balance training;Patient/family education    PT Goals (Current goals can be found in the Care Plan section)  Acute Rehab PT Goals Patient Stated Goal: none stated PT Goal Formulation: With patient Time For Goal Achievement: 02/24/20 Potential to Achieve Goals: Good    Frequency Min 3X/week   Barriers to discharge        Co-evaluation               AM-PAC PT "6 Clicks" Mobility  Outcome Measure Help needed turning from your back to your side while in a flat bed without using bedrails?: None Help needed moving from lying on your back to sitting on the side of a flat bed without using bedrails?: None Help needed moving to and from a bed to a chair (including a wheelchair)?: A Little Help needed standing up from a chair using your arms (e.g., wheelchair or bedside chair)?: A Little Help needed  to walk in hospital room?: A Lot Help needed climbing 3-5 steps with a railing? : A Lot 6 Click Score: 18    End of Session Equipment Utilized During Treatment: Gait belt Activity Tolerance: Patient tolerated treatment well Patient left: in bed;with call bell/phone within reach;with bed alarm set Nurse Communication: Mobility status PT Visit Diagnosis: Unsteadiness on feet (R26.81);Ataxic gait (R26.0);Difficulty in walking, not elsewhere classified (R26.2)    Time: 9326-7124 PT Time Calculation (min) (ACUTE ONLY): 34 min   Charges:   PT Evaluation $PT Eval Low Complexity: 1 Low PT Treatments $Gait Training: 8-22 mins     Lillia Pauls, PT, DPT Acute Rehabilitation Services Pager [phone] Office [phone]   Norval Morton 02/10/2020, 9:19 AM

## 2020-02-10 NOTE — Progress Notes (Signed)
Progress Note    [name]  HMC:[id number] DOB: January 02, 1961  DOA: 02/09/2020 PCP: Verdene Lennert, MD    Brief Narrative:   Chief complaint: ataxia  Medical records reviewed and are as summarized below:  [name] is an 59 y.o. male etoh abuse, scented to the emergency department3/11/complaint of ataxia.  Patient reports he has been "a little wobbly" for several weeks now.  Yesterday his employer noticed his unsteady gait drove him home and recommended that his girlfriend bring him to the hospital.  Patient does not acknowledge that his gait was any worse than usual.  Work-up includes a CT of the head with no acute abnormalities.  Assessment/Plan:   Principal Problem:   Ataxia Active Problems:   Elevated transaminase level   ETOH abuse   Hyponatremia   Mild renal insufficiency   Acute lower UTI   Thrombocytopenia (HCC)   Macrocytic anemia   History of CVA (cerebrovascular accident)  #1.  Ataxia.  Likely related to ongoing EtOH abuse.  MRI of the brain negative for acute infarct but does show remote CVAs.  Hemoglobin A1c 4.6.  Lipid panel with HDL 27 otherwise within the limits of normal.  Phosphorus 1.7, folate, RPR, B12, TSH all within the limits of normal.  Urinary tract infection consistent with UTI.  He is provided with Rocephin.  Evaluated by physical therapy who recommend CIR. -Continue Rocephin for urinary tract infection -Resolved metabolic derangements -Monitor  #2.  Urinary tract infection.  Urinalysis as noted above.  Rocephin was initiated in the emergency department.  Patient is afebrile hemodynamically stable and nontoxic-appearing -Follow urine culture -Continue Rocephin -Monitor urine output  3.  Hyponatremia.  Likely related to EtOH use.  Sodium trending up this morning. -Gentle IV fluids -Monitor  #4.  Acute kidney injury.  Mild.  Creatinine 1.4 on admission.  Today creatinine 1.15.  Likely related to decreased oral intake in the setting of  EtOH abuse. -Gentle IV fluids -Hold nephrotoxins -Monitor  #5.  EtOH use.  He states he drinks 40 ounces of beer a day.  Reports his last drink was "3 or 4 days ago.  He denies ever having withdrawals in the past -CIWA protocol  #6.  History of CVA.  MRI of the brain negative for acute findings but notable for remote CVAs.  Patient unaware that he has ever had any type of stroke.  #7.  Macrocytic anemia/thrombocytopenia.  Lab work consistent with EtOH abuse.  No sign symptoms of active bleeding.  IMA globin 10.7 -Monitor  #8. Elevated transaminase.  Related to EtOH use.  Trending down somewhat today   Family Communication/Anticipated D/C date and plan/Code Status   DVT prophylaxis: sdc. Code Status: Full Code.  Family Communication: patient Disposition Plan: to be determined   Medical Consultants:    None.   Anti-Infectives:    None  Subjective:   Awake alert oriented to self and place.  Denies pain discomfort.  Quite inarticulate and appears he lacks insight to his condition  Objective:    Vitals:   02/10/20 0322 02/10/20 0515 02/10/20 0741 02/10/20 1202  BP: 93/72 96/70 100/77 97/76  Pulse: 82 75 81 82  Resp: 16 20 19 19   Temp: 98.9 F (37.2 C) 99.1 F (37.3 C) 98.5 F (36.9 C) 99.3 F (37.4 C)  TempSrc: Oral Oral Oral Oral  SpO2: 100% 100% 98% 98%    Intake/Output Summary (Last 24 hours) at 02/10/2020 1519 Last data filed at 02/10/2020 0600 Gross  per 24 hour  Intake 951.29 ml  Output --  Net 951.29 ml   There were no vitals filed for this visit.  Exam: General: Awake alert no acute distress CV: Regular rate and rhythm no murmur gallop or rub Respiratory: No increased work of breathing breath sounds are clear bilaterally no wheeze no rhonchi Abdomen nondistended soft positive bowel sounds throughout no guarding or rebound Musculoskeletal: Joints without swelling/erythema moves all extremities spontaneously Neuro: Alert and oriented to self and  place slow to respond but speech clear seems to have some memory issues follows commands  Data Reviewed:   I have personally reviewed following labs and imaging studies:  Labs: Labs show the following:   Basic Metabolic Panel: Recent Labs  Lab 02/09/20 1557 02/10/20 0601  NA 131* 134*  K 3.9 3.7  CL 99 101  CO2 22 22  GLUCOSE 138* 91  BUN 10 11  CREATININE 1.41* 1.15  CALCIUM 8.0* 7.5*  MG  --  2.0  PHOS  --  1.7*   GFR CrCl cannot be calculated (Unknown ideal weight.). Liver Function Tests: Recent Labs  Lab 02/10/20 0601  AST 53*  51*  ALT 24  24  ALKPHOS 129*  135*  BILITOT 2.3*  2.2*  PROT 5.4*  5.6*  ALBUMIN 2.0*  2.1*   No results for input(s): LIPASE, AMYLASE in the last 168 hours. No results for input(s): AMMONIA in the last 168 hours. Coagulation profile No results for input(s): INR, PROTIME in the last 168 hours.  CBC: Recent Labs  Lab 02/09/20 1557  WBC 15.9*  HGB 10.7*  HCT 32.4*  MCV 104.5*  PLT 77*   Cardiac Enzymes: No results for input(s): CKTOTAL, CKMB, CKMBINDEX, TROPONINI in the last 168 hours. BNP (last 3 results) No results for input(s): PROBNP in the last 8760 hours. CBG: No results for input(s): GLUCAP in the last 168 hours. D-Dimer: No results for input(s): DDIMER in the last 72 hours. Hgb A1c: Recent Labs    02/10/20 0601  HGBA1C 4.6*   Lipid Profile: Recent Labs    02/10/20 0601  CHOL 61  HDL 27*  LDLCALC 24  TRIG 50  CHOLHDL 2.3   Thyroid function studies: Recent Labs    02/09/20 1948  TSH 1.827   Anemia work up: Recent Labs    02/09/20 1948  VITAMINB12 829  FOLATE 13.1   Sepsis Labs: Recent Labs  Lab 02/09/20 1557  WBC 15.9*    Microbiology Recent Results (from the past 240 hour(s))  SARS CORONAVIRUS 2 (TAT 6-24 HRS) Nasopharyngeal Nasopharyngeal Swab     Status: None   Collection Time: 02/09/20  7:50 PM   Specimen: Nasopharyngeal Swab  Result Value Ref Range Status   SARS  Coronavirus 2 NEGATIVE NEGATIVE Final    Comment: (NOTE) SARS-CoV-2 target nucleic acids are NOT DETECTED. The SARS-CoV-2 RNA is generally detectable in upper and lower respiratory specimens during the acute phase of infection. Negative results do not preclude SARS-CoV-2 infection, do not rule out co-infections with other pathogens, and should not be used as the sole basis for treatment or other patient management decisions. Negative results must be combined with clinical observations, patient history, and epidemiological information. The expected result is Negative. Fact Sheet for Patients: HairSlick.no Fact Sheet for Healthcare Providers: quierodirigir.com This test is not yet approved or cleared by the [place] FDA and  has been authorized for detection and/or diagnosis of SARS-CoV-2 by FDA under an Emergency Use Authorization (EUA). This EUA  will remain  in effect (meaning this test can be used) for the duration of the COVID-19 declaration under Section 56 4(b)(1) of the Act, 21 U.S.C. section 360bbb-3(b)(1), unless the authorization is terminated or revoked sooner. Performed at [facility], [place] [address], [place], [place] [address]     Procedures and diagnostic studies:  MR Brain W and Wo Contrast  Result Date: 02/09/2020 CLINICAL DATA:  Initial evaluation for acute ataxia, stroke suspected. EXAM: MRI HEAD WITHOUT AND WITH CONTRAST TECHNIQUE: Multiplanar, multiecho pulse sequences of the brain and surrounding structures were obtained without and with intravenous contrast. CONTRAST:  6.64mL GADAVIST GADOBUTROL 1 MMOL/ML IV SOLN COMPARISON:  Prior head CT from 04/07/2017. FINDINGS: Brain: Diffuse prominence of the CSF containing spaces compatible with generalized cerebral atrophy, fairly advanced for patient age. Mild chronic microvascular ischemic change noted within the periventricular white matter. Encephalomalacia  involving the posterior right external capsule compatible with chronic lacunar type infarct. Associated chronic hemosiderin staining. Few additional remote lacunar infarcts noted involving the bilateral thalami. Few tiny remote bilateral cerebellar infarcts noted. No abnormal foci of restricted diffusion to suggest acute or subacute ischemia. Gray-white matter differentiation maintained. No encephalomalacia to suggest chronic cortical infarction. No other evidence for acute or chronic intracranial hemorrhage. No mass lesion, midline shift or mass effect. No hydrocephalus or extra-axial fluid collection. Pituitary gland suprasellar region normal. Midline structures intact. No abnormal enhancement. Vascular: Major intracranial vascular flow voids are maintained. Skull and upper cervical spine: Craniocervical junction within normal limits. Degenerative spondylosis noted at C2-3 with resultant mild spinal stenosis. Bone marrow signal intensity within normal limits. No scalp soft tissue abnormality. Sinuses/Orbits: Globes orbital soft tissues demonstrate no acute finding. Asymmetric axial myopia noted on the right. Scattered mucosal thickening noted throughout the paranasal sinuses. Superimposed left maxillary sinus retention cyst. Trace bilateral mastoid effusions noted, of doubtful significance. Visualized nasopharynx within normal limits. Other: None. IMPRESSION: 1. No acute intracranial abnormality. 2. Multiple scattered remote lacunar infarcts involving the right external capsule, bilateral thalami, and cerebellum. 3. Markedly advanced cerebral atrophy for age. Electronically Signed   By: Jeannine Boga M.D.   On: 02/09/2020 22:39    Medications:   . calcium carbonate  1 tablet Oral BID WC  . folic acid  1 mg Oral Daily  . LORazepam  0-4 mg Oral Q6H   Followed by  . [START ON 02/12/2020] LORazepam  0-4 mg Oral Q12H  . multivitamin with minerals  1 tablet Oral Daily  . sodium chloride flush  3 mL  Intravenous Q12H   Continuous Infusions: . cefTRIAXone (ROCEPHIN)  IV 1 g (02/10/20 0026)  . thiamine injection 500 mg (02/10/20 0953)     LOS: 0 days   Radene Gunning NP  [facility]   How to contact the [facility] Attending or Consulting provider [place] or covering provider during after hours [place], for this patient?  1. Check the care team in [facility] and look for a) attending/consulting [facility] provider listed and b) the [facility] team listed 2. Log into [email or web address] and use 's universal password to access. If you do not have the password, please contact the hospital operator. 3. Locate the [facility] provider you are looking for under [facility] and page to a number that you can be directly reached. 4. If you still have difficulty reaching the provider, please page the [facility] (Director on Call) for the Hospitalists listed on amion for assistance.  02/10/2020, 3:19 PM

## 2020-02-10 NOTE — Progress Notes (Signed)
Rehab Admissions Coordinator Note:  Per PT recommendation, patient was screened by Stephania Fragmin for appropriateness for an Inpatient Acute Rehab Consult.  At this time, pt already mobilizing 120'. I will follow over the weekend for progress, as he may be able to go home.  Stephania Fragmin 02/10/2020, 2:21 PM  I can be reached at [phone].

## 2020-02-11 LAB — COMPREHENSIVE METABOLIC PANEL
ALT: 20 U/L (ref 0–44)
AST: 31 U/L (ref 15–41)
Albumin: 1.9 g/dL — ABNORMAL LOW (ref 3.5–5.0)
Alkaline Phosphatase: 119 U/L (ref 38–126)
Anion gap: 7 (ref 5–15)
BUN: 7 mg/dL (ref 6–20)
CO2: 24 mmol/L (ref 22–32)
Calcium: 7.8 mg/dL — ABNORMAL LOW (ref 8.9–10.3)
Chloride: 105 mmol/L (ref 98–111)
Creatinine, Ser: 1.15 mg/dL (ref 0.61–1.24)
GFR calc Af Amer: >60 mL/min (ref >60)
GFR calc non Af Amer: >60 mL/min (ref >60)
Glucose, Bld: 108 mg/dL — ABNORMAL HIGH (ref 70–99)
Potassium: 3.2 mmol/L — ABNORMAL LOW (ref 3.5–5.1)
Sodium: 136 mmol/L (ref 135–145)
Total Bilirubin: 1.2 mg/dL (ref 0.3–1.2)
Total Protein: 5 g/dL — ABNORMAL LOW (ref 6.5–8.1)

## 2020-02-11 LAB — CBC
HCT: 29.1 % — ABNORMAL LOW (ref 39.0–52.0)
Hemoglobin: 9.7 g/dL — ABNORMAL LOW (ref 13.0–17.0)
MCH: 34.6 pg — ABNORMAL HIGH (ref 26.0–34.0)
MCHC: 33.3 g/dL (ref 30.0–36.0)
MCV: 103.9 fL — ABNORMAL HIGH (ref 80.0–100.0)
Platelets: 74 10*3/uL — ABNORMAL LOW (ref 150–400)
RBC: 2.8 MIL/uL — ABNORMAL LOW (ref 4.22–5.81)
RDW: 12.9 % (ref 11.5–15.5)
WBC: 7.3 10*3/uL (ref 4.0–10.5)
nRBC: 0 % (ref 0.0–0.2)

## 2020-02-11 MED ORDER — K PHOS MONO-SOD PHOS DI & MONO 155-852-130 MG PO TABS
500.0000 mg | ORAL_TABLET | Freq: Every day | ORAL | Status: DC
  Administered 2020-02-11 – 2020-02-13 (×3): 500 mg via ORAL
  Filled 2020-02-11 (×3): qty 2

## 2020-02-11 MED ORDER — INFLUENZA VAC SPLIT QUAD 0.5 ML IM SUSY
0.5000 mL | PREFILLED_SYRINGE | INTRAMUSCULAR | Status: AC
  Administered 2020-02-13: 0.5 mL via INTRAMUSCULAR
  Filled 2020-02-11: qty 0.5

## 2020-02-11 MED ORDER — PNEUMOCOCCAL VAC POLYVALENT 25 MCG/0.5ML IJ INJ
0.5000 mL | INJECTION | INTRAMUSCULAR | Status: AC
  Administered 2020-02-13: 0.5 mL via INTRAMUSCULAR
  Filled 2020-02-11: qty 0.5

## 2020-02-11 NOTE — Evaluation (Signed)
Occupational Therapy Evaluation Patient Details Name: [name] MRN: [id number] DOB: 1961-07-29 Today's Date: 02/11/2020    History of Present Illness Pt is a 59 y.o. M who denies significant PMH but admits to excessive daily alcohol use who presents for evaluation of gait difficulty over past month. MRI negative for acute findings but notable for remote CVAs. Pt also found to have thrombocytopenia, hyponatremia and UTI.   Clinical Impression   PTA, pt was living at home with his girlfriend, pt reports he was independent with ADL/IADL and functional mobiltiy. Pt was working in Aeronautical engineer. Pt reports having balance difficulties for the past month. He currently presents with UE dysmetria, decreased balance, and poor awareness of deficits. Pt required modA to correct posterior LOB with initial stand, pt stated he "is safe to walk alone" and if he loses his balance he would "stumble to something to catch myself". He required modA for stability while completing grooming activity at sink level with no UE support. He demonstrates decreased coordination and undershooting depth perception during oral care. Due to decline in current level of function, pt would benefit from acute OT to address established goals to facilitate safe D/C to venue listed below. At this time, recommend CIR follow-up. Will continue to follow acutely.     Follow Up Recommendations  CIR    Equipment Recommendations  None recommended by OT    Recommendations for Other Services       Precautions / Restrictions Precautions Precautions: Fall Restrictions Weight Bearing Restrictions: No      Mobility Bed Mobility Overal bed mobility: Needs Assistance Bed Mobility: Supine to Sit;Sit to Supine     Supine to sit: Supervision Sit to supine: Supervision   General bed mobility comments: supervision for safety  Transfers Overall transfer level: Needs assistance Equipment used: Rolling walker (2 wheeled) Transfers:  Sit to/from Stand Sit to Stand: Mod assist         General transfer comment: modA to steady in standing;pt with significant retropulsion;    Balance Overall balance assessment: Needs assistance Sitting-balance support: Feet supported Sitting balance-Leahy Scale: Fair     Standing balance support: Bilateral upper extremity supported;No upper extremity supported Standing balance-Leahy Scale: Poor Standing balance comment: pt reliant on external support;when standing unsupported pt with significant loss of balance posteriorly required modA to stabilize;stood at sink for ADL, minA for stability with single UE support, modA for stabiltiy with no upper extremity support reaching outside base of support                           ADL either performed or assessed with clinical judgement   ADL Overall ADL's : Needs assistance/impaired Eating/Feeding: Set up;Sitting   Grooming: Moderate assistance;Standing Grooming Details (indicate cue type and reason): pt completed oral care while standing at sink level;modA for stability in standing Upper Body Bathing: Minimal assistance;Sitting   Lower Body Bathing: Moderate assistance;Sit to/from stand   Upper Body Dressing : Minimal assistance;Sitting   Lower Body Dressing: Moderate assistance;Sit to/from stand   Toilet Transfer: Minimal assistance;Moderate assistance;Ambulation;RW Toilet Transfer Details (indicate cue type and reason): minA primarily with intermittent modA to correct loss of balance Toileting- Clothing Manipulation and Hygiene: Moderate assistance;Sit to/from stand Toileting - Clothing Manipulation Details (indicate cue type and reason): modA for stability in standing     Functional mobility during ADLs: Moderate assistance;Rolling walker General ADL Comments: minA for stability while completing ADL with single UE support;modA for stability with  no UE support;pt unable to tolerate challenge to balance     Vision  Patient Visual Report: No change from baseline Vision Assessment?: Yes Eye Alignment: Impaired (comment)(R eye aligned laterally) Ocular Range of Motion: Within Functional Limits Alignment/Gaze Preference: Within Defined Limits Tracking/Visual Pursuits: Decreased smoothness of horizontal tracking;Decreased smoothness of vertical tracking;Requires cues, head turns, or add eye shifts to track;Unable to hold eye position out of midline;Impaired - to be further tested in functional context;Right eye does not track medially Saccades: Undershoots Convergence: Impaired (comment)(no convergence noted) Visual Fields: No apparent deficits Depth Perception: Undershoots Additional Comments: pt appears to have poor acuity, read from menu and held menu within 6inches of face;pt unable to read team names on television;     Perception     Praxis      Pertinent Vitals/Pain Pain Assessment: Faces Faces Pain Scale: No hurt Pain Intervention(s): Monitored during session     Hand Dominance Right   Extremity/Trunk Assessment Upper Extremity Assessment Upper Extremity Assessment: RUE deficits/detail;LUE deficits/detail RUE Deficits / Details: Grossly 4/5, Dysmetric;undershooting RUE Coordination: decreased fine motor;decreased gross motor LUE Deficits / Details: Grossly 4/5, Dysmetric;undershooting LUE Coordination: decreased fine motor;decreased gross motor   Lower Extremity Assessment RLE Deficits / Details: Strength 5/5 LLE Deficits / Details: Strength 5/5   Cervical / Trunk Assessment Cervical / Trunk Assessment: Normal   Communication Communication Communication: Other (comment)(slow speech (unsure of baseline))   Cognition Arousal/Alertness: Awake/alert Behavior During Therapy: Flat affect Overall Cognitive Status: Impaired/Different from baseline Area of Impairment: Memory;Safety/judgement;Awareness;Attention;Following commands;Problem solving                   Current  Attention Level: Sustained Memory: Decreased short-term memory Following Commands: Follows one step commands with increased time;Follows multi-step commands inconsistently Safety/Judgement: Decreased awareness of safety;Decreased awareness of deficits Awareness: Intellectual Problem Solving: Slow processing;Difficulty sequencing;Requires verbal cues;Requires tactile cues General Comments:  0/3 short term memory recall. No awareness of deficits, reporting, "my balance is good." pt with significant loss of balance;required cues for safety and safe use of DME (pt attempted to return to bed without support of RW while standing in RW);   General Comments  HR up to 137bpm with grooming at sink level;resting HR 108    Exercises     Shoulder Instructions      Home Living Family/patient expects to be discharged to:: Private residence Living Arrangements: Other (Comment)(girlfriend) Available Help at Discharge: Available PRN/intermittently;Other (Comment)(girlfriend) Type of Home: Apartment Home Access: Stairs to enter Entrance Stairs-Number of Steps: 6 Entrance Stairs-Rails: Right;Left(through front entrance) Home Layout: One level               Home Equipment: None          Prior Functioning/Environment Level of Independence: Independent        Comments: Works in Biomedical scientist, 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 Does not drive, reports employer picks him up for work        OT Problem List: Decreased strength;Decreased activity tolerance;Impaired balance (sitting and/or standing);Impaired vision/perception;Decreased coordination;Decreased cognition;Decreased safety awareness;Decreased knowledge of use of DME or AE;Decreased knowledge of precautions      OT Treatment/Interventions: Self-care/ADL training;Therapeutic exercise;Energy conservation;DME and/or AE instruction;Therapeutic activities;Cognitive remediation/compensation;Visual/perceptual remediation/compensation;Patient/family  education;Balance training    OT Goals(Current goals can be found in the care plan section) Acute Rehab OT Goals Patient Stated Goal: none stated OT Goal Formulation: With patient Time For Goal Achievement: 02/25/20 Potential to Achieve Goals: Good ADL Goals Pt Will Perform Grooming: with supervision;standing Pt Will Perform Lower Body  Dressing: with supervision;sit to/from stand Pt Will Transfer to Toilet: with supervision;ambulating Additional ADL Goal #1: Pt will demonstrate independence with 3 fall prevention strategies.  OT Frequency: Min 2X/week   Barriers to D/C:            Co-evaluation              AM-PAC OT "6 Clicks" Daily Activity     Outcome Measure Help from another person eating meals?: A Little Help from another person taking care of personal grooming?: A Little Help from another person toileting, which includes using toliet, bedpan, or urinal?: A Little Help from another person bathing (including washing, rinsing, drying)?: A Little Help from another person to put on and taking off regular upper body clothing?: A Little Help from another person to put on and taking off regular lower body clothing?: A Little 6 Click Score: 18   End of Session Equipment Utilized During Treatment: Gait belt;Rolling walker Nurse Communication: Mobility status  Activity Tolerance: Patient tolerated treatment well Patient left: in bed;with call bell/phone within reach;with bed alarm set  OT Visit Diagnosis: Unsteadiness on feet (R26.81);Other abnormalities of gait and mobility (R26.89);Muscle weakness (generalized) (M62.81);Other symptoms and signs involving cognitive function                Time: 5462-7035 OT Time Calculation (min): 20 min Charges:  OT General Charges $OT Visit: 1 Visit OT Evaluation $OT Eval Moderate Complexity: 1 Mod  Shamell Hittle OTR/L Acute Rehabilitation Services Office: [phone]   Rebeca Alert 02/11/2020, 3:51 PM

## 2020-02-11 NOTE — Progress Notes (Signed)
Progress Note    [name]  ZOX:[id number] DOB: 07/26/1961  DOA: 02/09/2020 PCP: Verdene Lennert, MD    Brief Narrative:   Chief complaint: ataxia  Medical records reviewed and are as summarized below:  [name] is an 59 y.o. male etoh abuse, scented to the emergency department 3/11 with complaint of ataxia.  Patient reports he has been "a little wobbly" for several weeks now.  Yesterday his employer noticed his unsteady gait drove him home and recommended that his girlfriend bring him to the hospital.  Patient does not acknowledge that his gait was any worse than usual.  Work-up includes a CT of the head with no acute abnormalities.  Assessment/Plan:   Principal Problem:   Ataxia Active Problems:   Elevated transaminase level   ETOH abuse   Hyponatremia   Thrombocytopenia (HCC)   Macrocytic anemia   Mild renal insufficiency   History of CVA (cerebrovascular accident)   Acute lower UTI   Ataxia-query a chronic component with an acute worsening due to infection -Likely related to ongoing EtOH abuse.   -MRI of the brain negative for acute infarct but does show remote CVAs.   -Hemoglobin A1c 4.6.   -Lipid panel with HDL 27 otherwise within the limits of normal.   -Phosphorus 1.7, folate, RPR, B12, TSH all within the limits of normal.   -Urinary tract infection consistent with UTI.  He is provided with Rocephin.  Evaluated by physical therapy who recommend CIR. -Continue Rocephin for urinary tract infection--it appears urine culture never sent -Patient to get another PT eval today, will hope that with IV antibiotics he will do better  Urinary tract infection.  Urinalysis as noted above.  - Rocephin  -Follow urine culture if ever sent  Hyponatremia.  Likely related to EtOH use.  Sodium trending up this morning. -Currently within normal limits  Acute kidney injury.  Mild.  Creatinine 1.4 on admission.   -Currently within normal limits   EtOH use.  He states he  drinks 40 ounces of beer a day.  Reports his last drink was "3 or 4 days ago.  He denies ever having withdrawals in the past -CIWA protocol -Vitamin supplementation  History of CVA.  - MRI of the brain negative for acute findings but notable for remote CVAs.  Patient unaware that he has ever had any type of stroke. -Monitor on telemetry for atrial fibrillation and outpatient neurology follow-up    Elevated transaminase.  Related to EtOH use.  Trending down somewhat today   Incidental finding of a positive HIV screening test -Patient had similar presentation in 2018 -We will perform confirmatory test which were negative last time  Family Communication/Anticipated D/C date and plan/Code Status   DVT prophylaxis: SCDs Code Status: Full Code.  Family Communication: patient Disposition Plan: Patient to be reevaluated by PT today so we can work on a safe discharge plan   Medical Consultants:    None.     Subjective:   No current complaints  Objective:    Vitals:   02/11/20 0453 02/11/20 0615 02/11/20 0817 02/11/20 1221  BP: 99/70 104/75 112/77 118/89  Pulse: 82 85 72 (!) 109  Resp: 16  19 18   Temp: 99 F (37.2 C)  98.8 F (37.1 C) 98.1 F (36.7 C)  TempSrc: Oral  Oral Oral  SpO2: 99% 100% 100% 100%   No intake or output data in the 24 hours ending 02/11/20 1431 There were no vitals filed for this  visit.  Exam: Patient will awaken and converse but his speech is slowed Regular rate and rhythm No increased work of breathing Moves all 4 extremities  Data Reviewed:   I have personally reviewed following labs and imaging studies:  Labs: Labs show the following:   Basic Metabolic Panel: Recent Labs  Lab 02/09/20 1557 02/09/20 1557 02/10/20 0601 02/11/20 0430  NA 131*  --  134* 136  K 3.9   < > 3.7 3.2*  CL 99  --  101 105  CO2 22  --  22 24  GLUCOSE 138*  --  91 108*  BUN 10  --  11 7  CREATININE 1.41*  --  1.15 1.15  CALCIUM 8.0*  --  7.5* 7.8*   MG  --   --  2.0  --   PHOS  --   --  1.7*  --    < > = values in this interval not displayed.   GFR CrCl cannot be calculated (Unknown ideal weight.). Liver Function Tests: Recent Labs  Lab 02/10/20 0601 02/11/20 0430  AST 53*  51* 31  ALT 24  24 20   ALKPHOS 129*  135* 119  BILITOT 2.3*  2.2* 1.2  PROT 5.4*  5.6* 5.0*  ALBUMIN 2.0*  2.1* 1.9*   No results for input(s): LIPASE, AMYLASE in the last 168 hours. No results for input(s): AMMONIA in the last 168 hours. Coagulation profile No results for input(s): INR, PROTIME in the last 168 hours.  CBC: Recent Labs  Lab 02/09/20 1557 02/11/20 0430  WBC 15.9* 7.3  HGB 10.7* 9.7*  HCT 32.4* 29.1*  MCV 104.5* 103.9*  PLT 77* 74*   Cardiac Enzymes: No results for input(s): CKTOTAL, CKMB, CKMBINDEX, TROPONINI in the last 168 hours. BNP (last 3 results) No results for input(s): PROBNP in the last 8760 hours. CBG: No results for input(s): GLUCAP in the last 168 hours. D-Dimer: No results for input(s): DDIMER in the last 72 hours. Hgb A1c: Recent Labs    02/10/20 0601  HGBA1C 4.6*   Lipid Profile: Recent Labs    02/10/20 0601  CHOL 61  HDL 27*  LDLCALC 24  TRIG 50  CHOLHDL 2.3   Thyroid function studies: Recent Labs    02/09/20 1948  TSH 1.827   Anemia work up: Recent Labs    02/09/20 1948  VITAMINB12 829  FOLATE 13.1   Sepsis Labs: Recent Labs  Lab 02/09/20 1557 02/11/20 0430  WBC 15.9* 7.3    Microbiology Recent Results (from the past 240 hour(s))  SARS CORONAVIRUS 2 (TAT 6-24 HRS) Nasopharyngeal Nasopharyngeal Swab     Status: None   Collection Time: 02/09/20  7:50 PM   Specimen: Nasopharyngeal Swab  Result Value Ref Range Status   SARS Coronavirus 2 NEGATIVE NEGATIVE Final    Comment: (NOTE) SARS-CoV-2 target nucleic acids are NOT DETECTED. The SARS-CoV-2 RNA is generally detectable in upper and lower respiratory specimens during the acute phase of infection. Negative results do  not preclude SARS-CoV-2 infection, do not rule out co-infections with other pathogens, and should not be used as the sole basis for treatment or other patient management decisions. Negative results must be combined with clinical observations, patient history, and epidemiological information. The expected result is Negative. Fact Sheet for Patients: 04/10/20 Fact Sheet for Healthcare Providers: HairSlick.no This test is not yet approved or cleared by the quierodirigir.com FDA and  has been authorized for detection and/or diagnosis of SARS-CoV-2 by FDA under an  Emergency Use Authorization (EUA). This EUA will remain  in effect (meaning this test can be used) for the duration of the COVID-19 declaration under Section 56 4(b)(1) of the Act, 21 U.S.C. section 360bbb-3(b)(1), unless the authorization is terminated or revoked sooner. Performed at [facility], [address], [place], [place] [address]     Procedures and diagnostic studies:  MR Brain W and Wo Contrast  Result Date: 02/09/2020 CLINICAL DATA:  Initial evaluation for acute ataxia, stroke suspected. EXAM: MRI HEAD WITHOUT AND WITH CONTRAST TECHNIQUE: Multiplanar, multiecho pulse sequences of the brain and surrounding structures were obtained without and with intravenous contrast. CONTRAST:  6.26mL GADAVIST GADOBUTROL 1 MMOL/ML IV SOLN COMPARISON:  Prior head CT from 04/07/2017. FINDINGS: Brain: Diffuse prominence of the CSF containing spaces compatible with generalized cerebral atrophy, fairly advanced for patient age. Mild chronic microvascular ischemic change noted within the periventricular white matter. Encephalomalacia involving the posterior right external capsule compatible with chronic lacunar type infarct. Associated chronic hemosiderin staining. Few additional remote lacunar infarcts noted involving the bilateral thalami. Few tiny remote bilateral cerebellar  infarcts noted. No abnormal foci of restricted diffusion to suggest acute or subacute ischemia. Gray-white matter differentiation maintained. No encephalomalacia to suggest chronic cortical infarction. No other evidence for acute or chronic intracranial hemorrhage. No mass lesion, midline shift or mass effect. No hydrocephalus or extra-axial fluid collection. Pituitary gland suprasellar region normal. Midline structures intact. No abnormal enhancement. Vascular: Major intracranial vascular flow voids are maintained. Skull and upper cervical spine: Craniocervical junction within normal limits. Degenerative spondylosis noted at C2-3 with resultant mild spinal stenosis. Bone marrow signal intensity within normal limits. No scalp soft tissue abnormality. Sinuses/Orbits: Globes orbital soft tissues demonstrate no acute finding. Asymmetric axial myopia noted on the right. Scattered mucosal thickening noted throughout the paranasal sinuses. Superimposed left maxillary sinus retention cyst. Trace bilateral mastoid effusions noted, of doubtful significance. Visualized nasopharynx within normal limits. Other: None. IMPRESSION: 1. No acute intracranial abnormality. 2. Multiple scattered remote lacunar infarcts involving the right external capsule, bilateral thalami, and cerebellum. 3. Markedly advanced cerebral atrophy for age. Electronically Signed   By: Rise Mu M.D.   On: 02/09/2020 22:39    Medications:   . calcium carbonate  1 tablet Oral BID WC  . folic acid  1 mg Oral Daily  . [START ON 02/12/2020] influenza vac split quadrivalent PF  0.5 mL Intramuscular Tomorrow-1000  . LORazepam  0-4 mg Oral Q6H   Followed by  . [START ON 02/12/2020] LORazepam  0-4 mg Oral Q12H  . multivitamin with minerals  1 tablet Oral Daily  . phosphorus  500 mg Oral Daily  . [START ON 02/12/2020] pneumococcal 23 valent vaccine  0.5 mL Intramuscular Tomorrow-1000  . sodium chloride flush  3 mL Intravenous Q12H    Continuous Infusions: . cefTRIAXone (ROCEPHIN)  IV Stopped (02/11/20 0735)  . thiamine injection 500 mg (02/11/20 0956)     LOS: 1 day   Joseph Art  DO  [facility]   How to contact the [facility] Attending or Consulting provider 7A - 7P or covering provider during after hours 7P -7A, for this patient?  1. Check the care team in [facility] and look for a) attending/consulting [facility] provider listed and b) the [facility] team listed 2. Log into [email or web address] and use [facility]'s universal password to access. If you do not have the password, please contact the hospital operator. 3. Locate the [facility] provider you are looking for under [facility]  and page to a number that you can be directly reached. 4. If you still have difficulty reaching the provider, please page the [facility] (Director on Call) for the Hospitalists listed on amion for assistance.  02/11/2020, 2:31 PM

## 2020-02-11 NOTE — Progress Notes (Signed)
Physical Therapy Treatment Patient Details Name: [name] MRN: [id number] DOB: 04/04/1961 Today's Date: 02/11/2020    History of Present Illness Pt is a 59 y.o. M who denies significant PMH but admits to excessive daily alcohol use who presents for evaluation of gait difficulty over past month. MRI negative for acute findings but notable for remote CVAs. Pt also found to have thrombocytopenia, hyponatremia and UTI.    PT Comments    Pt supine in bed on arrival,  He was able to move to edge of bed and rise into standing with min assistance and improved balance.  Post transfer he did ambulate in halls with minor LOB to the R.  Continue to recommend aggressive CIR therapy before return home.  Will continue to progress mobility during acute stay.     Follow Up Recommendations  CIR;Supervision/Assistance - 24 hour     Equipment Recommendations  Rolling walker with 5" wheels    Recommendations for Other Services       Precautions / Restrictions Precautions Precautions: Fall Restrictions Weight Bearing Restrictions: No    Mobility  Bed Mobility Overal bed mobility: Needs Assistance Bed Mobility: Supine to Sit;Sit to Supine     Supine to sit: Supervision Sit to supine: Supervision   General bed mobility comments: supervision for safety  Transfers Overall transfer level: Needs assistance Equipment used: Rolling walker (2 wheeled) Transfers: Sit to/from Stand Sit to Stand: Min assist         General transfer comment: Cues for hand placement to and from seated surface.  Ambulation/Gait Ambulation/Gait assistance: Min assist Gait Distance (Feet): 150 Feet Assistive device: Rolling walker (2 wheeled) Gait Pattern/deviations: Step-through pattern;Decreased stride length;Staggering right;Staggering left;Ataxic Gait velocity: decreased   General Gait Details: Minor LOb to the R but able to correct with min assistance.  Pt is slow and guarded this session but balance  improved from earlier session.   Stairs             Wheelchair Mobility    Modified Rankin (Stroke Patients Only)       Balance Overall balance assessment: Needs assistance Sitting-balance support: Feet supported Sitting balance-Leahy Scale: Fair     Standing balance support: Bilateral upper extremity supported;No upper extremity supported Standing balance-Leahy Scale: Poor Standing balance comment: pt reliant on external support;when standing unsupported pt with significant loss of balance posteriorly required modA to stabilize;stood at sink for ADL, minA for stability with single UE support, modA for stabiltiy with no upper extremity support reaching outside base of support                            Cognition Arousal/Alertness: Awake/alert Behavior During Therapy: Flat affect Overall Cognitive Status: Impaired/Different from baseline Area of Impairment: Memory;Safety/judgement;Awareness;Attention;Following commands;Problem solving                   Current Attention Level: Sustained Memory: Decreased short-term memory Following Commands: Follows one step commands with increased time;Follows multi-step commands inconsistently Safety/Judgement: Decreased awareness of safety;Decreased awareness of deficits Awareness: Intellectual Problem Solving: Slow processing;Difficulty sequencing;Requires verbal cues;Requires tactile cues General Comments:  0/3 short term memory recall. No awareness of deficits, reporting, "my balance is good." pt with significant loss of balance;required cues for safety and safe use of DME (pt attempted to return to bed without support of RW while standing in RW);      Exercises General Exercises - Lower Extremity Heel Raises: AROM;Both;20 reps;Standing  General Comments General comments (skin integrity, edema, etc.): HR up to 137bpm with grooming at sink level;resting HR 108      Pertinent Vitals/Pain Pain Assessment:  Faces Faces Pain Scale: No hurt Pain Intervention(s): Monitored during session    Home Living Family/patient expects to be discharged to:: Private residence Living Arrangements: Other (Comment)(girlfriend) Available Help at Discharge: Available PRN/intermittently;Other (Comment)(girlfriend) Type of Home: Apartment Home Access: Stairs to enter Entrance Stairs-Rails: Right;Left(through front entrance) Home Layout: One level Home Equipment: None      Prior Function Level of Independence: Independent      Comments: Works in [employer]ical [employer], 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 Does not drive, reports employer picks him up for work   PT Goals (current goals can now be found in the care plan section) Acute Rehab PT Goals Patient Stated Goal: none stated Potential to Achieve Goals: Good Progress towards PT goals: Progressing toward goals    Frequency    Min 3X/week      PT Plan Current plan remains appropriate    Co-evaluation              AM-PAC PT "6 Clicks" Mobility   Outcome Measure  Help needed turning from your back to your side while in a flat bed without using bedrails?: None Help needed moving from lying on your back to sitting on the side of a flat bed without using bedrails?: None Help needed moving to and from a bed to a chair (including a wheelchair)?: A Little Help needed standing up from a chair using your arms (e.g., wheelchair or bedside chair)?: A Little Help needed to walk in hospital room?: A Lot Help needed climbing 3-5 steps with a railing? : A Lot 6 Click Score: 18    End of Session Equipment Utilized During Treatment: Gait belt Activity Tolerance: Patient tolerated treatment well Patient left: in bed;with call bell/phone within reach;with bed alarm set Nurse Communication: Mobility status PT Visit Diagnosis: Unsteadiness on feet (R26.81);Ataxic gait (R26.0);Difficulty in walking, not elsewhere classified (R26.2)     Time: 3151-7616 PT Time Calculation (min)  (ACUTE ONLY): 19 min  Charges:  $Gait Training: 8-22 mins                     [name] , PTA Acute Rehabilitation Services Pager [phone] Office [phone]     Breniyah Romm Eli Hose 02/11/2020, 5:08 PM

## 2020-02-12 NOTE — Progress Notes (Signed)
Progress Note    [name]  [name] DOB: 1961-01-17  DOA: 02/09/2020 PCP: Jose Persia, MD    Brief Narrative:   Chief complaint: ataxia  Medical records reviewed and are as summarized below:  [name] is an 59 y.o. male etoh abuse, scented to the emergency department 3/11 with complaint of ataxia.  Patient reports he has been "a little wobbly" for several weeks now.  Yesterday his employer noticed his unsteady gait drove him home and recommended that his girlfriend bring him to the hospital.  Patient does not acknowledge that his gait was any worse than usual.  Work-up includes a CT of the head with no acute abnormalities.  Assessment/Plan:   Principal Problem:   Ataxia Active Problems:   Elevated transaminase level   ETOH abuse   Hyponatremia   Thrombocytopenia (HCC)   Macrocytic anemia   Mild renal insufficiency   History of CVA (cerebrovascular accident)   Acute lower UTI   Ataxia-query a chronic component with an acute worsening due to infection/continued alcohol abuse.   -MRI of the brain negative for acute infarct but does show remote/old CVAs.   -Hemoglobin A1c 4.6.   -Lipid panel with HDL 27 otherwise within the limits of normal.   -Phosphorus 1.7, folate, RPR, B12, TSH all within the limits of normal.   -Urinary tract infection consistent with UTI.   - Rocephin (it appears that urine culture was never sent) Evaluated by physical therapy who recommend CIR. -Await safe discharge plan as patient not back to his baseline  Urinary tract infection.  Urinalysis as noted above.  - Rocephin  -Follow urine culture if ever sent  Hyponatremia.  Likely related to EtOH use.  Sodium trending up this morning. -Currently within normal limits  Acute kidney injury.  Mild.  Creatinine 1.4 on admission.   -Currently within normal limits   EtOH use.  He states he drinks 40 ounces of beer a day.  Reports his last drink was "3 or 4 days ago.  He denies ever  having withdrawals in the past -CIWA protocol -Vitamin supplementation with high-dose IV thiamine  History of CVA.  - MRI of the brain negative for acute findings but notable for remote CVAs.  Patient unaware that he has ever had any type of stroke. -Monitor on telemetry for atrial fibrillation and outpatient neurology follow-up    Elevated transaminase.  Related to EtOH use.   -Resolved   Incidental finding of a positive HIV screening test -Patient had similar presentation in 2018 -We will perform confirmatory test which were negative last time  Family Communication/Anticipated D/C date and plan/Code Status   DVT prophylaxis: SCDs Code Status: Full Code.  Family Communication: patient Disposition Plan: Needs placement with CIR or continued PT management for safe discharge planning   Medical Consultants:    None.     Subjective:   No current complaints.  Does not want to be bothered this a.m.  Objective:    Vitals:   02/12/20 0121 02/12/20 0137 02/12/20 0407 02/12/20 0700  BP: (!) 82/72 98/68 108/84 112/83  Pulse: (!) 106 (!) 103 89 78  Resp: 19 20 17 16   Temp:   98.4 F (36.9 C)   TempSrc:      SpO2: 99% 99%  99%    Intake/Output Summary (Last 24 hours) at 02/12/2020 1236 Last data filed at 02/12/2020 1055 Gross per 24 hour  Intake 654.24 ml  Output 2400 ml  Net -1745.76 ml  There were no vitals filed for this visit.  Exam: In bed, sleeping Irritable when woken up Regular rate and rhythm No increased work of breathing Speech slow but patient answers questions appropriately  Data Reviewed:   I have personally reviewed following labs and imaging studies:  Labs: Labs show the following:   Basic Metabolic Panel: Recent Labs  Lab 02/09/20 1557 02/09/20 1557 02/10/20 0601 02/11/20 0430  NA 131*  --  134* 136  K 3.9   < > 3.7 3.2*  CL 99  --  101 105  CO2 22  --  22 24  GLUCOSE 138*  --  91 108*  BUN 10  --  11 7  CREATININE 1.41*  --   1.15 1.15  CALCIUM 8.0*  --  7.5* 7.8*  MG  --   --  2.0  --   PHOS  --   --  1.7*  --    < > = values in this interval not displayed.   GFR CrCl cannot be calculated (Unknown ideal weight.). Liver Function Tests: Recent Labs  Lab 02/10/20 0601 02/11/20 0430  AST 53*  51* 31  ALT 24  24 20   ALKPHOS 129*  135* 119  BILITOT 2.3*  2.2* 1.2  PROT 5.4*  5.6* 5.0*  ALBUMIN 2.0*  2.1* 1.9*   No results for input(s): LIPASE, AMYLASE in the last 168 hours. No results for input(s): AMMONIA in the last 168 hours. Coagulation profile No results for input(s): INR, PROTIME in the last 168 hours.  CBC: Recent Labs  Lab 02/09/20 1557 02/11/20 0430  WBC 15.9* 7.3  HGB 10.7* 9.7*  HCT 32.4* 29.1*  MCV 104.5* 103.9*  PLT 77* 74*   Cardiac Enzymes: No results for input(s): CKTOTAL, CKMB, CKMBINDEX, TROPONINI in the last 168 hours. BNP (last 3 results) No results for input(s): PROBNP in the last 8760 hours. CBG: No results for input(s): GLUCAP in the last 168 hours. D-Dimer: No results for input(s): DDIMER in the last 72 hours. Hgb A1c: Recent Labs    02/10/20 0601  HGBA1C 4.6*   Lipid Profile: Recent Labs    02/10/20 0601  CHOL 61  HDL 27*  LDLCALC 24  TRIG 50  CHOLHDL 2.3   Thyroid function studies: Recent Labs    02/09/20 1948  TSH 1.827   Anemia work up: Recent Labs    02/09/20 1948  VITAMINB12 829  FOLATE 13.1   Sepsis Labs: Recent Labs  Lab 02/09/20 1557 02/11/20 0430  WBC 15.9* 7.3    Microbiology Recent Results (from the past 240 hour(s))  SARS CORONAVIRUS 2 (TAT 6-24 HRS) Nasopharyngeal Nasopharyngeal Swab     Status: None   Collection Time: 02/09/20  7:50 PM   Specimen: Nasopharyngeal Swab  Result Value Ref Range Status   SARS Coronavirus 2 NEGATIVE NEGATIVE Final    Comment: (NOTE) SARS-CoV-2 target nucleic acids are NOT DETECTED. The SARS-CoV-2 RNA is generally detectable in upper and lower respiratory specimens during the acute  phase of infection. Negative results do not preclude SARS-CoV-2 infection, do not rule out co-infections with other pathogens, and should not be used as the sole basis for treatment or other patient management decisions. Negative results must be combined with clinical observations, patient history, and epidemiological information. The expected result is Negative. Fact Sheet for Patients: 04/10/20 Fact Sheet for Healthcare Providers: HairSlick.no This test is not yet approved or cleared by the quierodirigir.com FDA and  has been authorized for detection and/or  diagnosis of SARS-CoV-2 by FDA under an Emergency Use Authorization (EUA). This EUA will remain  in effect (meaning this test can be used) for the duration of the COVID-19 declaration under Section 56 4(b)(1) of the Act, 21 U.S.C. section 360bbb-3(b)(1), unless the authorization is terminated or revoked sooner. Performed at [facility], [address], [place], [place] [address]     Procedures and diagnostic studies:  No results found.  Medications:   . calcium carbonate  1 tablet Oral BID WC  . folic acid  1 mg Oral Daily  . influenza vac split quadrivalent PF  0.5 mL Intramuscular Tomorrow-1000  . LORazepam  0-4 mg Oral Q12H  . multivitamin with minerals  1 tablet Oral Daily  . phosphorus  500 mg Oral Daily  . pneumococcal 23 valent vaccine  0.5 mL Intramuscular Tomorrow-1000  . sodium chloride flush  3 mL Intravenous Q12H   Continuous Infusions: . cefTRIAXone (ROCEPHIN)  IV Stopped (02/12/20 0724)  . thiamine injection 500 mg (02/12/20 1055)     LOS: 2 days   Joseph Art  DO  [facility]   How to contact the [facility] Attending or Consulting provider 7A - 7P or covering provider during after hours 7P -7A, for this patient?  1. Check the care team in [facility] and look for a) attending/consulting [facility] provider listed and b) the [facility] team  listed 2. Log into [email or web address] and use [facility] universal password to access. If you do not have the password, please contact the hospital operator. 3. Locate the [facility] provider you are looking for under [facility] and page to a number that you can be directly reached. 4. If you still have difficulty reaching the provider, please page the [facility] (Director on Call) for the Hospitalists listed on amion for assistance.  02/12/2020, 12:36 PM

## 2020-02-12 NOTE — Progress Notes (Signed)
Physical Therapy Treatment Patient Details Name: [name] MRN: [id number] DOB: [birth year] Today's Date: 02/12/2020    History of Present Illness Pt is a 59 y.o. M who denies significant PMH but admits to excessive daily alcohol use who presents for evaluation of gait difficulty over past month. MRI negative for acute findings but notable for remote CVAs. Pt also found to have thrombocytopenia, hyponatremia and UTI.    PT Comments    Pt with flat affect but did well with me today.  Worked on challenging his balance and trying to get him more aware of his loss of balance.  PT will continue to progress pt to help decrease his fall risk   Follow Up Recommendations  CIR;Supervision/Assistance - 24 hour     Equipment Recommendations  Rolling walker with 5" wheels    Recommendations for Other Services       Precautions / Restrictions Precautions Precautions: Fall Restrictions Weight Bearing Restrictions: No Other Position/Activity Restrictions: pt denies dizziness    Mobility  Bed Mobility Overal bed mobility: Needs Assistance Bed Mobility: Supine to Sit     Supine to sit: Supervision        Transfers Overall transfer level: Needs assistance Equipment used: Rolling walker (2 wheeled) Transfers: Sit to/from Stand Sit to Stand: Min assist         General transfer comment: Cues for hand placement to and from seated surface.  Ambulation/Gait Ambulation/Gait assistance: Min assist Gait Distance (Feet): 150 Feet Assistive device: Rolling walker (2 wheeled) Gait Pattern/deviations: Step-through pattern;Decreased stride length;Staggering right;Staggering left;Ataxic     General Gait Details: Pt walked with RW - educated on safety with turning - making sure to keep feet inside the RW.  10-15 feet increments - pt did longer strides, increased heel strike and tried marching but i never saw his feet lift high.   Stairs             Wheelchair Mobility     Modified Rankin (Stroke Patients Only)       Balance Overall balance assessment: Needs assistance           Standing balance-Leahy Scale: Poor Standing balance comment: Pt stood with RW - he lost balane once posterior and said "I was just stretching"                            Cognition Arousal/Alertness: Awake/alert Behavior During Therapy: Flat affect Overall Cognitive Status: Impaired/Different from baseline                           Safety/Judgement: Decreased awareness of safety;Decreased awareness of deficits     General Comments: slow processing.  delayed balance responses      Exercises Other Exercises Other Exercises: Pt stood at sink - did hip abduction x 10 reps each side Other Exercises: Pt stood at sink - did heel raises x 10 reps Other Exercises: Pt stood at sink - did mini knee bends x 10 reps - not smooth motion but safe    General Comments        Pertinent Vitals/Pain Pain Assessment: No/denies pain    Home Living                      Prior Function            PT Goals (current goals can now be found in the care plan  section) Progress towards PT goals: Progressing toward goals    Frequency    Min 3X/week      PT Plan Current plan remains appropriate    Co-evaluation              AM-PAC PT "6 Clicks" Mobility   Outcome Measure  Help needed turning from your back to your side while in a flat bed without using bedrails?: A Little Help needed moving from lying on your back to sitting on the side of a flat bed without using bedrails?: A Little Help needed moving to and from a bed to a chair (including a wheelchair)?: A Little Help needed standing up from a chair using your arms (e.g., wheelchair or bedside chair)?: A Little Help needed to walk in hospital room?: A Little Help needed climbing 3-5 steps with a railing? : A Lot 6 Click Score: 17    End of Session Equipment Utilized During  Treatment: Gait belt Activity Tolerance: Patient tolerated treatment well Patient left: in bed;with call bell/phone within reach;with bed alarm set Nurse Communication: Mobility status PT Visit Diagnosis: Unsteadiness on feet (R26.81);Ataxic gait (R26.0);Difficulty in walking, not elsewhere classified (R26.2)     Time: 1350-1415 PT Time Calculation (min) (ACUTE ONLY): 25 min  Charges:  $Gait Training: 8-22 mins $Therapeutic Exercise: 8-22 mins                     02/12/2020   Ranae Palms, PT    Judson Roch 02/12/2020, 4:13 PM

## 2020-02-13 LAB — BASIC METABOLIC PANEL
Anion gap: 8 (ref 5–15)
BUN: 8 mg/dL (ref 6–20)
CO2: 26 mmol/L (ref 22–32)
Calcium: 8.3 mg/dL — ABNORMAL LOW (ref 8.9–10.3)
Chloride: 104 mmol/L (ref 98–111)
Creatinine, Ser: 1.21 mg/dL (ref 0.61–1.24)
GFR calc Af Amer: >60 mL/min (ref >60)
GFR calc non Af Amer: >60 mL/min (ref >60)
Glucose, Bld: 110 mg/dL — ABNORMAL HIGH (ref 70–99)
Potassium: 3.4 mmol/L — ABNORMAL LOW (ref 3.5–5.1)
Sodium: 138 mmol/L (ref 135–145)

## 2020-02-13 LAB — CBC
HCT: 29.1 % — ABNORMAL LOW (ref 39.0–52.0)
Hemoglobin: 9.6 g/dL — ABNORMAL LOW (ref 13.0–17.0)
MCH: 34.9 pg — ABNORMAL HIGH (ref 26.0–34.0)
MCHC: 33 g/dL (ref 30.0–36.0)
MCV: 105.8 fL — ABNORMAL HIGH (ref 80.0–100.0)
Platelets: 107 10*3/uL — ABNORMAL LOW (ref 150–400)
RBC: 2.75 MIL/uL — ABNORMAL LOW (ref 4.22–5.81)
RDW: 13.4 % (ref 11.5–15.5)
WBC: 7.8 10*3/uL (ref 4.0–10.5)
nRBC: 0 % (ref 0.0–0.2)

## 2020-02-13 MED ORDER — THIAMINE HCL 100 MG PO TABS: 100.0000 mg | ORAL_TABLET | Freq: Every day | ORAL | Status: DC

## 2020-02-13 MED ORDER — FOLIC ACID 1 MG PO TABS: 1.0000 mg | ORAL_TABLET | Freq: Every day | ORAL | 0 refills | Status: DC

## 2020-02-13 MED ORDER — CEPHALEXIN 500 MG PO CAPS: 500.0000 mg | ORAL_CAPSULE | Freq: Two times a day (BID) | ORAL | 0 refills | Status: AC

## 2020-02-13 MED ORDER — ADULT MULTIVITAMIN W/MINERALS CH: 1.0000 | ORAL_TABLET | Freq: Every day | ORAL | Status: DC

## 2020-02-13 NOTE — Progress Notes (Signed)
Occupational Therapy Treatment Patient Details Name: [name] MRN: [id number] DOB: 09/11/1961 Today's Date: 02/13/2020    History of present illness Pt is a 59 y.o. M who denies significant PMH but admits to excessive daily alcohol use who presents for evaluation of gait difficulty over past month. MRI negative for acute findings but notable for remote CVAs. Pt also found to have thrombocytopenia, hyponatremia and UTI.   OT comments  Pt seen for OT treatment session, at start of session RN arriving and reporting pt discharging and that his ride had arrived. Pt completed UB/LB dressing, requiring minA during LB aspect of task due to posterior LOB while advancing pants over hips. Pt with urgency to urinate, required minA for mobility to bathroom using RW and minA to maintain standing balance while voiding bladder; pt also requiring cues for safety and safe RW use as he initially attempted to mobilize to bathroom without AD. He requires increased time to carry out functional tasks during this session.  Pt reports plans to return home with girlfriend's assist. Emphasis placed/educated pt to have his significant other present during mobility/ADL tasks to ensure safety and to reduce risk for falls given pt's continued imbalances. Discharge recommendations updated given pt now returning home vs post acute rehab.    Follow Up Recommendations  Supervision/Assistance - 24 hour;Home health OT(updated as pt returning home (instead of post acute rehab))    Equipment Recommendations  None recommended by OT          Precautions / Restrictions Precautions Precautions: Fall Restrictions Weight Bearing Restrictions: No       Mobility Bed Mobility Overal bed mobility: Needs Assistance Bed Mobility: Supine to Sit     Supine to sit: Supervision     General bed mobility comments: supervision for safety  Transfers Overall transfer level: Needs assistance Equipment used: Rolling walker (2  wheeled) Transfers: Sit to/from Stand Sit to Stand: Min guard         General transfer comment: for safety and balance    Balance Overall balance assessment: Needs assistance Sitting-balance support: Feet supported Sitting balance-Leahy Scale: Fair     Standing balance support: Bilateral upper extremity supported;No upper extremity supported;During functional activity Standing balance-Leahy Scale: Poor Standing balance comment: pt loses balance without UE support/external assist, poor righting reaction                           ADL either performed or assessed with clinical judgement   ADL Overall ADL's : Needs assistance/impaired                 Upper Body Dressing : Set up;Supervision/safety;Sitting   Lower Body Dressing: Minimal assistance;Sit to/from stand Lower Body Dressing Details (indicate cue type and reason): pt donning pants and doffing/donning socks. pt initially bracing himself against EOB while standing to advance over hips, pt with x1 LOB posteriorly requiring minA to correct  Toilet Transfer: Minimal assistance;Ambulation;RW;Cueing for safety;Cueing for sequencing   Toileting- Clothing Manipulation and Hygiene: Minimal assistance;Moderate assistance;Sit to/from stand Toileting - Clothing Manipulation Details (indicate cue type and reason): pt requiring assist for standing whiel voiding bladder     Functional mobility during ADLs: Minimal assistance;Rolling walker;Cueing for safety;Cueing for sequencing General ADL Comments: pt continues to present with impaired cognition, weakness, imbalances     Vision       Perception     Praxis      Cognition Arousal/Alertness: Awake/alert Behavior During Therapy: Flat affect  Overall Cognitive Status: Impaired/Different from baseline Area of Impairment: Orientation;Following commands;Safety/judgement;Awareness;Problem solving                 Orientation Level: Disoriented to;Time Current  Attention Level: Sustained Memory: Decreased short-term memory Following Commands: Follows one step commands inconsistently;Follows one step commands with increased time Safety/Judgement: Decreased awareness of safety;Decreased awareness of deficits Awareness: Intellectual Problem Solving: Slow processing;Requires verbal cues;Requires tactile cues General Comments: decreased safety requiring cues to take RW with him when mobilizing to the bathroom. increased time for all tasks        Exercises     Shoulder Instructions       General Comments      Pertinent Vitals/ Pain       Pain Assessment: No/denies pain  Home Living                                          Prior Functioning/Environment              Frequency  Min 2X/week        Progress Toward Goals  OT Goals(current goals can now be found in the care plan section)  Progress towards OT goals: Progressing toward goals  Acute Rehab OT Goals Patient Stated Goal: none stated OT Goal Formulation: With patient Time For Goal Achievement: 02/25/20 Potential to Achieve Goals: Good  Plan Discharge plan needs to be updated    Co-evaluation                 AM-PAC OT "6 Clicks" Daily Activity     Outcome Measure   Help from another person eating meals?: None Help from another person taking care of personal grooming?: A Little Help from another person toileting, which includes using toliet, bedpan, or urinal?: A Little Help from another person bathing (including washing, rinsing, drying)?: A Little Help from another person to put on and taking off regular upper body clothing?: None Help from another person to put on and taking off regular lower body clothing?: A Little 6 Click Score: 20    End of Session Equipment Utilized During Treatment: Rolling walker  OT Visit Diagnosis: Unsteadiness on feet (R26.81);Other abnormalities of gait and mobility (R26.89);Muscle weakness (generalized)  (M62.81);Other symptoms and signs involving cognitive function   Activity Tolerance Patient tolerated treatment well   Patient Left in chair;with call bell/phone within reach;with chair alarm set;with nursing/sitter in room(NT present)   Nurse Communication Mobility status        Time: 5852-7782 OT Time Calculation (min): 31 min  Charges: OT General Charges $OT Visit: 1 Visit OT Treatments $Self Care/Home Management : 23-37 mins  Marcy Siren, OT Acute Rehabilitation Services Pager [phone] Office [phone]    Orlando Penner 02/13/2020, 1:35 PM

## 2020-02-13 NOTE — Plan of Care (Signed)
Patient stable, discussed POC with patient, agreeable with plan, denies question/concerns at this time.  

## 2020-02-13 NOTE — Discharge Summary (Signed)
Physician Discharge Summary  [name] EHU:[id number] DOB: [birth year] DOA: 02/09/2020  PCP: Patient being established with PCP Admit date: 02/09/2020 Discharge date: 02/13/2020  Admitted From: Home Discharge disposition: Home   Recommendations for Outpatient Follow-Up:   1. Outpatient PT/OT 2. Patient needs alcohol cessation and this was discussed in depth 3. Outpatient referral to neurology 4. Continue supplementation of vitamins 5. Confirmatory HIV screening still pending at discharge please follow to completion   Discharge Diagnosis:   Principal Problem:   Ataxia Active Problems:   Elevated transaminase level   ETOH abuse   Hyponatremia   Thrombocytopenia (HCC)   Macrocytic anemia   Mild renal insufficiency   History of CVA (cerebrovascular accident)   Acute lower UTI    Discharge Condition: Improved.  Diet recommendation: Low sodium, heart healthy Wound care: None.  Code status: Full.   History of Present Illness:   [name] is a 59 y.o. male who denies any significant past medical history no admits to excessive daily alcohol use, presenting to emergency department at the direction of his employer for evaluation of gait difficulty.  Patient reports worsening then generalized weakness and ability to ambulate over the past couple weeks to month.  Was having difficulty walking today at work and was sent to the ED by his employer for evaluation of this.  He denies any falls, headache, change in vision or hearing, or focal numbness or weakness.  He also denies any recent fevers, chills, or respiratory symptoms.  Usually drinks a 40 ounce beer daily.  He was having loose stools without abdominal pain, melena, or hematochezia, but reports that he had a solid BM today.  He reports poor appetite, does not eat much, worse in the past couple weeks, but denies any abdominal pain or nausea.  ED Course: Upon arrival to the ED, patient is found to be afebrile,  saturating well on room air, and with stable blood pressure.  Chemistry panel is notable for sodium of 131 and creatinine 1.41.  CBC features a leukocytosis to 15,900, microcytic anemia with hemoglobin 10.7, and thrombocytopenia with platelets 77,000.  Ethanol was undetectable, TSH was normal, and folate and B12 levels were both normal.  Urinalysis is nitrite positive with many bacteria and > 50 WBC/hpf.  MRI brain is negative for acute findings but notable for multiple scattered remote lacunar infarctions involving the right external capsule, bilateral thalami, and cerebellum, as well as markedly advanced cerebral atrophy for age.  Covid PCR screening test is in process.  Hospitalist consulted for admission   Hospital Course by Problem:   Ataxia-query a chronic component with an acute worsening due to infection/continued alcohol abuse/dehydration  -MRI of the brain negative for acute infarct but does show remote/old CVAs.   -Hemoglobin A1c 4.6.   -Lipid panel with HDL 27 otherwise within the limits of normal.   -Phosphorus 1.7, folate, RPR, B12, TSH all within the limits of normal.   -Urinary tract infection consistent with UTI.   - Rocephin (it appears that urine culture was never sent) Evaluated by physical therapy-initially CIR was recommended but patient has progressed with each visit  -Discussed with patient and he prefers to go home with his girlfriend and her son  Urinary tract infection.  Urinalysis as noted above.  - Rocephin  -Fortunately culture never sent so patient will finish treatment with a cephalosporin  Hyponatremia.  Likely related to EtOH use.  Sodium trending up this morning. -Currently within normal limits  Acute kidney injury.  Mild.  Creatinine 1.4 on admission.   -Currently within normal limits   EtOH use.  He states he drinks 40 ounces of beer a day.  Reports his last drink was "3 or 4 days ago.  He denies ever having withdrawals in the past -CIWA  protocol -Vitamin supplementation  status post high-dose IV thiamine  History of CVA.  - MRI of the brain negative for acute findings but notable for remote CVAs.  Patient unaware that he has ever had any type of stroke. -outpatient neurology follow-up   Elevated transaminase.  Related to EtOH use.   -Resolved   Incidental finding of a positive HIV screening test -Patient had similar presentation in 2018 -We will perform confirmatory test which were negative last time    Medical Consultants:      Discharge Exam:   Vitals:   02/13/20 0343 02/13/20 0742  BP: (!) 128/100 (!) 127/91  Pulse:  (!) 58  Resp: 16 16  Temp: 98.2 F (36.8 C) 98.3 F (36.8 C)  SpO2: 100% 100%   Vitals:   02/12/20 2012 02/13/20 0000 02/13/20 0343 02/13/20 0742  BP: 98/86 122/88 (!) 128/100 (!) 127/91  Pulse: 88 71  (!) 58  Resp: 18 18 16 16   Temp:  98.5 F (36.9 C) 98.2 F (36.8 C) 98.3 F (36.8 C)  TempSrc:  Oral Oral Oral  SpO2: 100% 100% 100% 100%    General exam: Appears calm and comfortable.    The results of significant diagnostics from this hospitalization (including imaging, microbiology, ancillary and laboratory) are listed below for reference.     Procedures and Diagnostic Studies:   MR Brain W and Wo Contrast  Result Date: 02/09/2020 CLINICAL DATA:  Initial evaluation for acute ataxia, stroke suspected. EXAM: MRI HEAD WITHOUT AND WITH CONTRAST TECHNIQUE: Multiplanar, multiecho pulse sequences of the brain and surrounding structures were obtained without and with intravenous contrast. CONTRAST:  6.32mL GADAVIST GADOBUTROL 1 MMOL/ML IV SOLN COMPARISON:  Prior head CT from 04/07/2017. FINDINGS: Brain: Diffuse prominence of the CSF containing spaces compatible with generalized cerebral atrophy, fairly advanced for patient age. Mild chronic microvascular ischemic change noted within the periventricular white matter. Encephalomalacia involving the posterior right external capsule  compatible with chronic lacunar type infarct. Associated chronic hemosiderin staining. Few additional remote lacunar infarcts noted involving the bilateral thalami. Few tiny remote bilateral cerebellar infarcts noted. No abnormal foci of restricted diffusion to suggest acute or subacute ischemia. Gray-white matter differentiation maintained. No encephalomalacia to suggest chronic cortical infarction. No other evidence for acute or chronic intracranial hemorrhage. No mass lesion, midline shift or mass effect. No hydrocephalus or extra-axial fluid collection. Pituitary gland suprasellar region normal. Midline structures intact. No abnormal enhancement. Vascular: Major intracranial vascular flow voids are maintained. Skull and upper cervical spine: Craniocervical junction within normal limits. Degenerative spondylosis noted at C2-3 with resultant mild spinal stenosis. Bone marrow signal intensity within normal limits. No scalp soft tissue abnormality. Sinuses/Orbits: Globes orbital soft tissues demonstrate no acute finding. Asymmetric axial myopia noted on the right. Scattered mucosal thickening noted throughout the paranasal sinuses. Superimposed left maxillary sinus retention cyst. Trace bilateral mastoid effusions noted, of doubtful significance. Visualized nasopharynx within normal limits. Other: None. IMPRESSION: 1. No acute intracranial abnormality. 2. Multiple scattered remote lacunar infarcts involving the right external capsule, bilateral thalami, and cerebellum. 3. Markedly advanced cerebral atrophy for age. Electronically Signed   By: 06/07/2017 M.D.   On: 02/09/2020 22:39  Labs:   Basic Metabolic Panel: Recent Labs  Lab 02/09/20 1557 02/09/20 1557 02/10/20 0601 02/10/20 0601 02/11/20 0430 02/13/20 0434  NA 131*  --  134*  --  136 138  K 3.9   < > 3.7   < > 3.2* 3.4*  CL 99  --  101  --  105 104  CO2 22  --  22  --  24 26  GLUCOSE 138*  --  91  --  108* 110*  BUN 10  --  11   --  7 8  CREATININE 1.41*  --  1.15  --  1.15 1.21  CALCIUM 8.0*  --  7.5*  --  7.8* 8.3*  MG  --   --  2.0  --   --   --   PHOS  --   --  1.7*  --   --   --    < > = values in this interval not displayed.   GFR CrCl cannot be calculated (Unknown ideal weight.). Liver Function Tests: Recent Labs  Lab 02/10/20 0601 02/11/20 0430  AST 53*  51* 31  ALT 24  24 20   ALKPHOS 129*  135* 119  BILITOT 2.3*  2.2* 1.2  PROT 5.4*  5.6* 5.0*  ALBUMIN 2.0*  2.1* 1.9*   No results for input(s): LIPASE, AMYLASE in the last 168 hours. No results for input(s): AMMONIA in the last 168 hours. Coagulation profile No results for input(s): INR, PROTIME in the last 168 hours.  CBC: Recent Labs  Lab 02/09/20 1557 02/11/20 0430 02/13/20 0434  WBC 15.9* 7.3 7.8  HGB 10.7* 9.7* 9.6*  HCT 32.4* 29.1* 29.1*  MCV 104.5* 103.9* 105.8*  PLT 77* 74* 107*   Cardiac Enzymes: No results for input(s): CKTOTAL, CKMB, CKMBINDEX, TROPONINI in the last 168 hours. BNP: Invalid input(s): POCBNP CBG: No results for input(s): GLUCAP in the last 168 hours. D-Dimer No results for input(s): DDIMER in the last 72 hours. Hgb A1c No results for input(s): HGBA1C in the last 72 hours. Lipid Profile No results for input(s): CHOL, HDL, LDLCALC, TRIG, CHOLHDL, LDLDIRECT in the last 72 hours. Thyroid function studies No results for input(s): TSH, T4TOTAL, T3FREE, THYROIDAB in the last 72 hours.  Invalid input(s): FREET3 Anemia work up No results for input(s): VITAMINB12, FOLATE, FERRITIN, TIBC, IRON, RETICCTPCT in the last 72 hours. Microbiology Recent Results (from the past 240 hour(s))  SARS CORONAVIRUS 2 (TAT 6-24 HRS) Nasopharyngeal Nasopharyngeal Swab     Status: None   Collection Time: 02/09/20  7:50 PM   Specimen: Nasopharyngeal Swab  Result Value Ref Range Status   SARS Coronavirus 2 NEGATIVE NEGATIVE Final    Comment: (NOTE) SARS-CoV-2 target nucleic acids are NOT DETECTED. The SARS-CoV-2 RNA is  generally detectable in upper and lower respiratory specimens during the acute phase of infection. Negative results do not preclude SARS-CoV-2 infection, do not rule out co-infections with other pathogens, and should not be used as the sole basis for treatment or other patient management decisions. Negative results must be combined with clinical observations, patient history, and epidemiological information. The expected result is Negative. Fact Sheet for Patients: 04/10/20 Fact Sheet for Healthcare Providers: HairSlick.no This test is not yet approved or cleared by the quierodirigir.com FDA and  has been authorized for detection and/or diagnosis of SARS-CoV-2 by FDA under an Emergency Use Authorization (EUA). This EUA will remain  in effect (meaning this test can be used) for the duration of the  COVID-19 declaration under Section 56 4(b)(1) of the Act, 21 U.S.C. section 360bbb-3(b)(1), unless the authorization is terminated or revoked sooner. Performed at [facility], [address], [place], [place] [address]      Discharge Instructions:   Discharge Instructions    Diet - low sodium heart healthy   Complete by: As directed    Discharge instructions   Complete by: As directed    Stop alcohol use   Increase activity slowly   Complete by: As directed      Allergies as of 02/13/2020   No Known Allergies     Medication List    TAKE these medications   cephALEXin 500 MG capsule Commonly known as: KEFLEX Take 1 capsule (500 mg total) by mouth 2 (two) times daily for 6 doses.   folic acid 1 MG tablet Commonly known as: FOLVITE Take 1 tablet (1 mg total) by mouth daily. Start taking on: February 14, 2020   multivitamin with minerals Tabs tablet Take 1 tablet by mouth daily. Start taking on: February 14, 2020   thiamine 100 MG tablet Take 1 tablet (100 mg total) by mouth daily.      Follow-up Information      [facility] Follow up on 02/27/2020.   Why: your appointment is at 9:30 am. Please arrive early and bring a picture ID and your current medications. Contact information: [place] [address] [phone]       [facility] Follow up.   Why: Please use this location for your pharmacy needs. they assist with the cost of you medications. Contact information: [address] [place] [address] [phone]           Time coordinating discharge: 35 min  Signed:  Joseph Art DO  [facility] 02/13/2020, 10:27 AM

## 2020-02-13 NOTE — Progress Notes (Signed)
D/C instructions provided to patient, denies questions/concerns at this time. Patient transported to front entrance via WC, tol well. 

## 2020-02-13 NOTE — TOC Transition Note (Signed)
Transition of Care [facility]) - CM/SW Discharge Note   Patient Details  Name: TAIYO KOZMA MRN: [id number] Date of Birth: 03/22/1961  Transition of Care [facility]) CM/SW Contact:  Kermit Balo, RN Phone Number: 02/13/2020, 11:50 AM   Clinical Narrative:    Pt discharging home today with outpatient therapy. Pt in agreement with outpatient therapy at [facility]. Orders in Epic and information on the AVS.  Pt feels he can afford the $10 cost of his abx at [facility]. CM provided him Good Rx card.  Walker for home at the bedside.  Pts girlfriend to provide transport home. She is aware and will pick him up at the main entrance. RN aware.    Final next level of care: OP Rehab Barriers to Discharge: Inadequate or no insurance, Barriers Unresolved (comment)   Patient Goals and CMS Choice     Choice offered to / list presented to : Patient  Discharge Placement                       Discharge Plan and Services   Discharge Planning Services: CM Consult Post Acute Care Choice: IP Rehab          DME Arranged: Walker rolling DME Agency: [employer]     Representative spoke with at DME Agency: charity services through [employer]Zack            Social Determinants of Health (SDOH) Interventions     Readmission Risk Interventions No flowsheet data found.

## 2020-02-13 NOTE — Discharge Instructions (Signed)
Ataxia  Ataxia is a condition that causes unsteadiness when walking and standing, poor coordination of body movements, and difficulty keeping a straight (upright) posture. It occurs because of a problem with the part of the brain that controls coordination and stability (cerebellum). Ataxia can develop later in life (acquired ataxia), during your 20s or 30s or even into your 60s or later. This type of ataxia develops when another medical condition, such as a stroke, damages the cerebellum. Ataxia also may be present early in life (non-acquired ataxia). There are two main types of non-acquired ataxia:  Congenital. This type is present at birth.  Hereditary. This type is passed from parent to child. The most common form of hereditary non-acquired ataxia is Friedreich ataxia. What are the causes? Acquired ataxia may be caused by:  Changes in the nervous system (neurodegenerative changes).  Changes throughout the body (systemic disorders).  A lot of exposure to: ? Certain medicines such as phenytoin and lithium. ? Solvents. These are cleaning fluids such as paint thinner, nail polish remover, carpet cleaner, and degreasers.  Alcohol abuse (alcoholism).  Medical conditions, such as: ? Celiac disease. ? Hypothyroidism. ? A lack (deficiency) of vitamin E, vitamin B12, or thiamine. ? Brain tumors. ? Multiple sclerosis. ? Cerebral palsy. ? Stroke. ? Paraneoplastic syndromes. ? Viral infections. ? Head injury. ? Malnutrition. Congenital and hereditary ataxia are caused by problems that are present in genes before birth. What are the signs or symptoms? Signs and symptoms of ataxia vary depending on the cause. They may include:  Being unsteady.  Walking with the legs wide apart (wide stance) to keep one's balance.  Uncontrolled shaking (tremor).  Poorly coordinated body movements.  Difficulty maintaining an upright posture.  Fatigue.  Changes in speech.  Changes in  vision.  Involuntary eye movements (nystagmus).  Difficulty swallowing.  Difficulty writing.  Muscle tightening that you cannot control (muscle spasms). How is this diagnosed? Ataxia may be diagnosed based on:  Your personal and family medical history.  A physical exam.  Imaging tests, such as a CT scan or MRI.  Spinal tap (lumbar puncture). This procedure involves using a needle to take a sample of the fluid around your brain and spinal cord.  Genetic testing. How is this treated? The underlying condition that causes your ataxia needs to be treated. If the cause is a brain tumor, you may need surgery. Treatment also focuses on helping you live with ataxia and improving your quality of life (supportive treatments). This may involve:  Learning ways to improve coordination and move around more carefully (physical therapy).  Learning ways to improve your ability to do daily tasks, such as bathing and feeding yourself (occupational therapy).  Using devices to help you move around, eat, or communicate (assistive devices), such as a walker, modified eating utensils, and communication aids.  Learning ways to improve speech and swallowing (speech therapy). Follow these instructions at home: Preventing falls  Lie down right away if you become very unsteady, dizzy, or nauseous, or if you feel like you are going to faint. Do not get up until all of those feelings pass.  Keep your home well-lit. Use night-lights as needed.  Remove tripping hazards, such as rugs, cords, and clutter.  Install grab bars by the toilet and in the tub and shower.  Use assistive devices such as a cane, walker, or wheelchair as needed to keep your balance. General instructions  Do not drink alcohol.  Ask your health care provider what activities are safe   for you, and what activities you should avoid.  Take over-the-counter and prescription medicines only as told by your health care provider. Get help  right away if you:  Have unsteadiness that suddenly worsens.  Have any of these: ? Severe headaches. ? Chest pain. ? Abdominal pain. ? Weakness or numbness on one side of your body. ? Vision problems. ? Difficulty speaking. ? An irregular heartbeat. ? A very fast pulse.  Feel confused. Summary  Ataxia is a condition that causes unsteadiness when walking and standing, poor coordination of body movements, and difficulty keeping a straight (upright) posture.  Ataxia occurs because of a problem with the part of the brain that controls coordination and stability (cerebellum).  The underlying condition that causes your ataxia needs to be treated. Treatment also focuses on helping you live with ataxia and improving your quality of life (supportive treatments).  Lie down right away if you become very unsteady, dizzy, or nauseous, or if you feel like you are going to faint. This information is not intended to replace advice given to you by your health care provider. Make sure you discuss any questions you have with your health care provider. Document Revised: 10/30/2017 Document Reviewed: 09/18/2017 Elsevier Patient Education  2020 Elsevier Inc.  

## 2020-02-14 LAB — HIV-1 RNA, QUALITATIVE, TMA: HIV-1 RNA, Qualitative, TMA: NEGATIVE

## 2020-02-15 LAB — HIV-1/2 AB - DIFFERENTIATION
HIV 1 Ab: NEGATIVE
HIV 1 Ab: NEGATIVE
HIV 2 Ab: NEGATIVE
HIV 2 Ab: NEGATIVE
Note: NEGATIVE
Note: NEGATIVE

## 2020-02-15 LAB — RNA QUALITATIVE
HIV 1 RNA Qualitative: 1
HIV 1 RNA Qualitative: 1

## 2020-02-16 LAB — VITAMIN B1: Vitamin B1 (Thiamine): 67 nmol/L (ref 66.5–200.0)

## 2020-02-22 LAB — VITAMIN B1: Vitamin B1 (Thiamine): 63 nmol/L — ABNORMAL LOW (ref 66.5–200.0)

## 2020-02-27 ENCOUNTER — Ambulatory Visit (INDEPENDENT_AMBULATORY_CARE_PROVIDER_SITE_OTHER): Payer: Self-pay | Admitting: Primary Care

## 2020-02-27 ENCOUNTER — Encounter (INDEPENDENT_AMBULATORY_CARE_PROVIDER_SITE_OTHER): Payer: Self-pay | Admitting: Primary Care

## 2020-02-27 ENCOUNTER — Other Ambulatory Visit: Payer: Self-pay

## 2020-02-27 VITALS — BP 126/82 | HR 84 | Temp 97.3°F | Ht 70.0 in | Wt 131.8 lb

## 2020-02-27 DIAGNOSIS — F10129 Alcohol abuse with intoxication, unspecified: Secondary | ICD-10-CM

## 2020-02-27 DIAGNOSIS — Z7689 Persons encountering health services in other specified circumstances: Secondary | ICD-10-CM

## 2020-02-27 DIAGNOSIS — R270 Ataxia, unspecified: Secondary | ICD-10-CM

## 2020-02-27 DIAGNOSIS — Z09 Encounter for follow-up examination after completed treatment for conditions other than malignant neoplasm: Secondary | ICD-10-CM

## 2020-02-27 NOTE — Progress Notes (Signed)
History and Physical:    [name]   WIO:[id number] DOB: [birth year] DOA: (Not on file)  PCP: Kerin Perna, NP   Chief Complaint: [name] 59 year old African American male presented to the office for a hospital follow up. No record received however patient brought hospital discharge paper that reveal appointment today at [facility] at 9;30 am.   History of Present Illness:   [name] is an 59 y.o. male alcohol abuse drinks 2 (40 oz of beer and liquor with certain crowds which makes him sicker. He denies any falls, headache, change in vision or hearing, or focal numbness or weakness  ROS:   Review of Systems  Genitourinary: Positive for frequency and urgency.  Neurological:       Slur speech unclear if relate alcohol use   Psychiatric/Behavioral: Positive for memory loss.  All other systems reviewed and are negative.   Constitutional: No fever, no chills;  Appetite normal; No weight loss, no weight gain, no fatigue.   HEENT: No blurry vision, no diplopia, no pharyngitis, no dysphagia  CV: No chest pain, no palpitations, no PND, no orthopnea, no edema.   Resp: No SOB, no cough, no pleuritic pain.  GI: No nausea, no vomiting, no diarrhea, no melena, no hematochezia, no constipation, no abdominal pain.   GU: No dysuria, no hematuria,  frequency,  urgency.  MSK: No myalgias, no arthralgias.   Neuro:  No headache, no focal neurological deficits, no history of seizures.   Psych: No depression, no anxiety.   Endo: No heat intolerance, no cold intolerance, no polyuria, no polydipsia   Skin: No rashes, no skin lesions.   Heme: No easy bruising.   Travel history: No recent travel.   Past Medical History:   Past Medical History:  Diagnosis Date  . ETOH abuse   . 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 HERNIA REPAIR      Social History:   Social History   Socioeconomic History  . Marital  status: Single    Spouse name: Not on file  . Number of children: Not on file  . Years of education: Not on file  . Highest education level: Not on file  Occupational History  . Not on file  Tobacco Use  . Smoking status: Never Smoker  . Smokeless tobacco: Never Used  Substance and Sexual Activity  . Alcohol use: Yes    Alcohol/week: 7.0 standard drinks    Types: 7 Cans of beer per week    Comment: Drinks a "40 and a half per day  . Drug use: No  . Sexual activity: Not on file  Other Topics Concern  . Not on file  Social History Narrative  . Not on file   Social Determinants of Health   Financial Resource Strain:   . Difficulty of Paying Living Expenses:   Food Insecurity:   . Worried About Charity fundraiser in the Last Year:   . Arboriculturist in the Last Year:   Transportation Needs:   . Film/video editor (Medical):   Marland Kitchen Lack of Transportation (Non-Medical):   Physical Activity:   . Days of Exercise per Week:   . Minutes of Exercise per Session:   Stress:   . Feeling of Stress :   Social Connections:   . Frequency of Communication with Friends and Family:   . Frequency of Social  Gatherings with Friends and Family:   . Attends Religious Services:   . Active Member of Clubs or Organizations:   . Attends Banker Meetings:   Marland Kitchen Marital Status:   Intimate Partner Violence:   . Fear of Current or Ex-Partner:   . Emotionally Abused:   Marland Kitchen Physically Abused:   . Sexually Abused:    Family history:   Family History  Problem Relation Age of Onset  . Cirrhosis Neg Hx   . Liver disease Neg Hx   . Cancer Neg Hx     Allergies   Patient has no known allergies.  Current Medications:   Prior to Admission medications   Medication Sig Start Date End Date Taking? Authorizing Provider  folic acid (FOLVITE) 1 MG tablet Take 1 tablet (1 mg total) by mouth daily. Patient not taking: Reported on 02/27/2020 02/14/20   Joseph Art, DO  Multiple Vitamin  (MULTIVITAMIN WITH MINERALS) TABS tablet Take 1 tablet by mouth daily. Patient not taking: Reported on 02/27/2020 02/14/20   Joseph Art, DO  thiamine 100 MG tablet Take 1 tablet (100 mg total) by mouth daily. Patient not taking: Reported on 02/27/2020 02/13/20   Joseph Art, DO    Physical Exam:   Vitals:   02/27/20 0943  BP: 126/82  Pulse: 84  Temp: (!) 97.3 F (36.3 C)  TempSrc: Temporal  SpO2: 97%  Weight: 131 lb 12.8 oz (59.8 kg)  Height: 5\' 10"  (1.778 m)     Physical Exam: Blood pressure 126/82, pulse 84, temperature (!) 97.3 F (36.3 C), temperature source Temporal, height 5\' 10"  (1.778 m), weight 131 lb 12.8 oz (59.8 kg), SpO2 97 %. Gen: No acute distress. Head: Normocephalic, atraumatic. Eyes: PERRL, EOMI, juandice  Neck: Supple, no thyromegaly, no lymphadenopathy, no jugular venous distention. Chest: clear to ascultation  CV: RR S1 & S2 heard, regular rate and rhythm. No extremity edema Abdomen: Soft, nontender, nondistended with normal active bowel sounds. Extremities: Extremities Skin: Warm and dry. Neuro: Alert and oriented times3;  Psych: Mood and affect normal.  Assessment/Plan:  [name] was seen today for hospitalization follow-up.  Diagnoses and all orders for this visit:  Encounter to establish care  Alcohol abuse with intoxication [facility])  Hospital discharge follow-up  Ataxia [name]

## 2020-02-27 NOTE — Patient Instructions (Addendum)
Community Resources  Advocacy/Legal Legal Aid [place]:  1-866-219-5262  /  336-272-0148  Family Justice Center:  336-641-7233  [facility] [place] 24-hr Crisis line:  336-273-7273  Women's Resource Center, [place]:  336-275-6090  Court Watch (custody):  336-275-2346  [place] [facility]:   336-279-9299    Baby & Breastfeeding Car Seat Inspection @ Various [place] Fire Depts.- call 336-373-2177  [place] [facility]actation  336-832-6860  [facility] 336-878-6712  WIC: 336-641-3663 ([place]);  336-641-7571 ([place])  La Leche League:  1-877-452-5321   Childcare [place] [facility]: 336-369-5097 ([place]) / 336-887-8224 ([place])  - Child Care Resources/ Referrals/ Scholarships  - Head Start/ Early Head Start (call or apply online)  [place] DHHS: [place] Pre-K :  1-800-859-0829 / 336-274-5437   Employment / Job Search [facility] [place]: 336-275-6090 / [address]  [employer] ([employer]Link): 336-373-5922 ([place]) / 336-882-4141 ([place])  [facility] Community Resource/ Career Center: 336-275-9801 / 336-282-7307  [place] [place] [facility] Job & Career Center: 336-373-3764  DHHS Work First: 336-641-3447 ([place]) / 336-641-3447 ([place])  [facility] [place]:  336-676-5871   Financial Assistance [place] [facility]:  336-553-2657  [employer]: 336-235-0368  [employer] (furniture):  336-370-4002  [place] [facility]: 336-373-4264  Low Income Energy Assistance  336-641-3000   Food Assistance DHHS- SNAP/ Food Stamps: 336-641-4588  WIC: [place]- 336-641-3663 ;  [place] 336-641-7571  Little Green Book- Free Meals  Little Blue Book- Free Food Pantries  During the summer, text "FOOD" to 877877   General Health / Clinics (Adults) Orange Card (for Adults) through [place] [facility]: (336) 895-4900  [facility]:   336-832-8035  [facility]:   336-832-4444  Health Department:  336-641-3245  [facility]:  336-415-3877 / 336-641-2100  Planned [facility] of [place]:   336-373-0678  [facility]:   336-334-4822 x 50251   Housing [place] Housing Coalition:   336-691-9521  [place] Housing Authority:  336-275-8501  Affordable Housing Managemnt:  336-273-0568   Immigrant/ Refugee Center for [place]ians ([employer]):  336-256-1065  Fa[facility]:  336-379-0037  [employer]:  336-937-4701  [employer] World Services:  336-617-0381  African Services Coalition:  336-574-2677   LGBTQ YouthSAFE  www.youthsafe[place].org  PFLAG  336-541-6754 / info@[place].org  The Trevor Project:  1-866-488-7386   Mental Health/ Substance Use [facility] [place]  336-387-6161  [facility]:  336-832-9700 or 1-800-711-2635  [facility]'s Circle of Care:  336-271-5888  [facility]:  336-294-1349  [place] Care Services:  336-542-2884  [facility] (walk-ins)  336-676-6840 / [address]  [place]:  800-449-1287  Alcoholics Anonymous:  336-854-4278  Narcotics Anonymous:  800-365-1036  Quit Smoking Hotline:  800-QUIT-NOW (800-784-8669)   Parenting Children's Home Society:  800-632-1400  [place]: Education Center & Support Groups:  336-832-6682  YWCA: 336-273-3461  [employer]CG: Bringing Out the Best:  336-334-3120               Thriving at Three (Hispanic families): 336-256-1066  Healthy Start ([facility] [place]):  336-387-6161 x2288  Parents as Teachers:  336-691-0024  [place] [facility]- Learning Together (Immigrants): 336-369-5001   Poison Control 800-222-1222  Sports & Recreation [employer] Open Doors Application: ymcanwnc.org/join/open-doors-financial-assistance/  City of [place] Recreation Centers: http://www.[place].gov/index.aspx?page=3615   Special Needs Family Support Network:  336-832-6507  Autism Society of [place]:   336-333-0197 x1402 or x1412 /  800-785-1035  TEACCH :  336-334-5773     ARC of [place]:  336-373-1076  Children's Developmental Service Agency (CDSA):  336-334-5601  CC4C (Care Coordination for Children):  336-641-7641   Transportation Medicaid Transportation: 336-641-4848 to apply  [place] Transit Authority: 336-335-6499 (reduced-fare bus ID to Medicaid/ Medicare/ Orange Card)  SCAT Paratransit services: Eligible riders only, call 336-333-6589 for application   Tutoring/Mentoring Black Child Development Institute: 336-230-2138  Big Brothers/ Big Sisters: 336-378-9100 ([place])  336-882-4167 ([place])  ACES through child's school: 336-370-2321  [employer] Achievers: contact your local Y  SHIELD Mentor Program: 336-337-2771   

## 2020-11-30 ENCOUNTER — Emergency Department (HOSPITAL_COMMUNITY)
Admission: EM | Admit: 2020-11-30 | Discharge: 2020-11-30 | Disposition: A | Discharge status: Home / Self Care | Payer: Self-pay | Attending: Emergency Medicine | Admitting: Emergency Medicine

## 2020-11-30 ENCOUNTER — Encounter (HOSPITAL_COMMUNITY): Payer: Self-pay | Admitting: Emergency Medicine

## 2020-11-30 ENCOUNTER — Other Ambulatory Visit: Payer: Self-pay

## 2020-11-30 DIAGNOSIS — R000 Tachycardia, unspecified: Secondary | ICD-10-CM | POA: Insufficient documentation

## 2020-11-30 DIAGNOSIS — N3001 Acute cystitis with hematuria: Secondary | ICD-10-CM | POA: Insufficient documentation

## 2020-11-30 DIAGNOSIS — Z20822 Contact with and (suspected) exposure to covid-19: Secondary | ICD-10-CM | POA: Insufficient documentation

## 2020-11-30 DIAGNOSIS — R112 Nausea with vomiting, unspecified: Secondary | ICD-10-CM | POA: Insufficient documentation

## 2020-11-30 DIAGNOSIS — E860 Dehydration: Secondary | ICD-10-CM | POA: Insufficient documentation

## 2020-11-30 LAB — COMPREHENSIVE METABOLIC PANEL
ALT: 23 U/L (ref 0–44)
AST: 61 U/L — ABNORMAL HIGH (ref 15–41)
Albumin: 2.8 g/dL — ABNORMAL LOW (ref 3.5–5.0)
Alkaline Phosphatase: 133 U/L — ABNORMAL HIGH (ref 38–126)
Anion gap: 12 (ref 5–15)
BUN: 7 mg/dL (ref 6–20)
CO2: 20 mmol/L — ABNORMAL LOW (ref 22–32)
Calcium: 8.1 mg/dL — ABNORMAL LOW (ref 8.9–10.3)
Chloride: 99 mmol/L (ref 98–111)
Creatinine, Ser: 1.17 mg/dL (ref 0.61–1.24)
GFR, Estimated: >60 mL/min (ref >60)
Glucose, Bld: 130 mg/dL — ABNORMAL HIGH (ref 70–99)
Potassium: 4.1 mmol/L (ref 3.5–5.1)
Sodium: 131 mmol/L — ABNORMAL LOW (ref 135–145)
Total Bilirubin: 3.8 mg/dL — ABNORMAL HIGH (ref 0.3–1.2)
Total Protein: 7.4 g/dL (ref 6.5–8.1)

## 2020-11-30 LAB — URINALYSIS, ROUTINE W REFLEX MICROSCOPIC
Bilirubin Urine: NEGATIVE
Glucose, UA: NEGATIVE mg/dL
Ketones, ur: 5 mg/dL — AB
Nitrite: POSITIVE — AB
Protein, ur: 30 mg/dL — AB
RBC / HPF: >50 RBC/hpf — ABNORMAL HIGH (ref 0–5)
Specific Gravity, Urine: 1.011 (ref 1.005–1.030)
WBC, UA: >50 WBC/hpf — ABNORMAL HIGH (ref 0–5)
pH: 7 (ref 5.0–8.0)

## 2020-11-30 LAB — CBC
HCT: 37 % — ABNORMAL LOW (ref 39.0–52.0)
Hemoglobin: 12.7 g/dL — ABNORMAL LOW (ref 13.0–17.0)
MCH: 35.9 pg — ABNORMAL HIGH (ref 26.0–34.0)
MCHC: 34.3 g/dL (ref 30.0–36.0)
MCV: 104.5 fL — ABNORMAL HIGH (ref 80.0–100.0)
Platelets: 88 10*3/uL — ABNORMAL LOW (ref 150–400)
RBC: 3.54 MIL/uL — ABNORMAL LOW (ref 4.22–5.81)
RDW: 13.4 % (ref 11.5–15.5)
WBC: 11.1 10*3/uL — ABNORMAL HIGH (ref 4.0–10.5)
nRBC: 0 % (ref 0.0–0.2)

## 2020-11-30 LAB — SARS CORONAVIRUS 2 (TAT 6-24 HRS): SARS Coronavirus 2: NEGATIVE

## 2020-11-30 LAB — LIPASE, BLOOD: Lipase: 11 U/L (ref 11–51)

## 2020-11-30 MED ORDER — LACTATED RINGERS IV BOLUS
1000.0000 mL | Freq: Once | INTRAVENOUS | Status: AC
  Administered 2020-11-30: 1000 mL via INTRAVENOUS

## 2020-11-30 MED ORDER — ONDANSETRON HCL 4 MG/2ML IJ SOLN
4.0000 mg | Freq: Once | INTRAMUSCULAR | Status: AC
  Administered 2020-11-30: 4 mg via INTRAVENOUS
  Filled 2020-11-30: qty 2

## 2020-11-30 MED ORDER — CEPHALEXIN 500 MG PO CAPS: 500.0000 mg | ORAL_CAPSULE | Freq: Three times a day (TID) | ORAL | 0 refills | Status: AC

## 2020-11-30 MED ORDER — ONDANSETRON 4 MG PO TBDP: 4.0000 mg | ORAL_TABLET | Freq: Three times a day (TID) | ORAL | 0 refills | Status: DC | PRN

## 2020-11-30 NOTE — ED Provider Notes (Signed)
  Provider Note MRN:  [id number]  Arrival date & time: 11/30/20    ED Course and Medical Decision Making  Assumed care from Dr. Anitra Lauth at shift change.  Nausea, vomiting, benign abdomen, tachycardic improvement with fluids. Urinalysis consistent with infection. Gross hematuria, suspect acute cystitis. Plan for ceftriaxone, reevaluation anticipating discharge on Keflex.  Patient given 2 L IV fluids and during this time had some labile heart rates, with sinus tachycardia on the monitor.  Patient continues to look and feel well and is requesting discharge.  Heart rate now improved to 106 on my assessment.  Appropriate for antibiotics and further hydration at home.  Procedures  Final Clinical Impressions(s) / ED Diagnoses     ICD-10-CM   1. Dehydration  E86.0   2. Nausea and vomiting, intractability of vomiting not specified, unspecified vomiting type  R11.2   3. Acute cystitis with hematuria  N30.01     ED Discharge Orders         Ordered    cephALEXin (KEFLEX) 500 MG capsule  3 times daily        11/30/20 1843    ondansetron (ZOFRAN ODT) 4 MG disintegrating tablet  Every 8 hours PRN        11/30/20 1843            Discharge Instructions     You were evaluated in the Emergency Department and after careful evaluation, we did not find any emergent condition requiring admission or further testing in the hospital.  Your exam/testing today was overall reassuring.  We suspect your symptoms are related to a urinary tract infection.  Please take the Keflex antibiotic as directed.  Drink plenty fluids at home.  You can use the Zofran medicine as needed for nausea.  Please return to the Emergency Department if you experience any worsening of your condition.  Thank you for allowing [place] to be a part of your care.      Elmer Sow. Pilar Plate, MD [facility] Emergency Medicine [facility] [email or web address]    Sabas Sous, MD 11/30/20 [phone]

## 2020-11-30 NOTE — ED Triage Notes (Signed)
Patient here from home. Patient complaint of n/v x1 week. Patient reports poor oral intake. Patient tachy in 140s in triage.

## 2020-11-30 NOTE — ED Provider Notes (Signed)
[facility] EMERGENCY DEPARTMENT Provider Note   CSN: [id number] Arrival date & time: 11/30/20  1045     History Chief Complaint  Patient presents with  . Nausea  . Emesis    EARNIE BECHARD is a 59 y.o. male.  The history is provided by the patient.  Emesis Severity:  Moderate Duration:  6 days Timing:  Intermittent Quality:  Stomach contents Progression:  Improving Chronicity:  Recurrent Recent urination:  Decreased Relieved by:  None tried Exacerbated by: trying to eat. Ineffective treatments:  None tried Associated symptoms: diarrhea   Associated symptoms: no abdominal pain, no chills, no cough, no fever, no headaches and no URI   Associated symptoms comment:  No CP or SOB.  Usually drinks a 40oz beer pre day but has decreased due to the nausea/vomiting and diarrhea.  No known bad food exposure.  No abd pain.  occassional flecks of bright red blood in the stool.  No tobacco or drug use.  Feels tired and weak.   Risk factors: alcohol use   Risk factors comment:  Does not take medications at home.      Past Medical History:  Diagnosis Date  . ETOH abuse   . 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 Ataxia 02/09/2020  . Thrombocytopenia (HCC) 02/09/2020  . Macrocytic anemia 02/09/2020  . Mild renal insufficiency 02/09/2020  . History of CVA (cerebrovascular accident) 02/09/2020  . Acute lower UTI 02/09/2020  . 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 Hyponatremia   . ETOH abuse   . Leukocytosis 04/04/2017  . Elevated transaminase level 04/04/2017    Past Surgical History:  Procedure Laterality Date  . HERNIA REPAIR         Family History  Problem Relation Age of Onset  . Cirrhosis Neg Hx   . Liver disease Neg Hx   . Cancer Neg Hx     Social History   Tobacco Use  . Smoking status: Never Smoker  . Smokeless tobacco: Never Used  Vaping Use  . Vaping Use: Never used  Substance Use Topics   . Alcohol use: Yes    Alcohol/week: 7.0 standard drinks    Types: 7 Cans of beer per week    Comment: Drinks a "40 and a half per day  . Drug use: No    Home Medications Prior to Admission medications   Medication Sig Start Date End Date Taking? Authorizing Provider  folic acid (FOLVITE) 1 MG tablet Take 1 tablet (1 mg total) by mouth daily. Patient not taking: No sig reported 02/14/20   Joseph Art, DO  Multiple Vitamin (MULTIVITAMIN WITH MINERALS) TABS tablet Take 1 tablet by mouth daily. Patient not taking: No sig reported 02/14/20   Joseph Art, DO  thiamine 100 MG tablet Take 1 tablet (100 mg total) by mouth daily. Patient not taking: No sig reported 02/13/20   Joseph Art, DO    Allergies    Patient has no known allergies.  Review of Systems   Review of Systems  Constitutional: Negative for chills and fever.  Respiratory: Negative for cough.   Gastrointestinal: Positive for diarrhea and vomiting. Negative for abdominal pain.  Neurological: Negative for headaches.  All other systems reviewed and are negative.   Physical Exam Updated Vital Signs BP (!) 145/99 (BP Location: Right Arm)   Pulse (!) 150   Temp 99.7 F (37.6 C) (Oral)   Resp 20  Ht 5\' 10"  (1.778 m)   SpO2 98%   BMI 18.91 kg/m   Physical Exam Vitals and nursing note reviewed.  Constitutional:      General: He is not in acute distress.    Appearance: He is well-developed, underweight and well-nourished.  HENT:     Head: Normocephalic and atraumatic.     Nose: Nose normal.     Mouth/Throat:     Mouth: Oropharynx is clear and moist. Mucous membranes are dry.  Eyes:     Extraocular Movements: EOM normal.     Conjunctiva/sclera: Conjunctivae normal.     Pupils: Pupils are equal, round, and reactive to light.  Cardiovascular:     Rate and Rhythm: Regular rhythm. Tachycardia present.     Pulses: Normal pulses and intact distal pulses.     Heart sounds: No murmur heard.   Pulmonary:      Effort: Pulmonary effort is normal. No respiratory distress.     Breath sounds: Normal breath sounds. No wheezing or rales.  Abdominal:     General: There is no distension.     Palpations: Abdomen is soft.     Tenderness: There is no abdominal tenderness. There is no guarding or rebound.  Genitourinary:    Comments: uncircumsized but no signs of lesion over the glands or discharge.  Pt does have blood at the urethral meatus Musculoskeletal:        General: No tenderness or edema. Normal range of motion.     Cervical back: Normal range of motion and neck supple.     Right lower leg: No edema.     Left lower leg: No edema.  Skin:    General: Skin is warm and dry.     Findings: No erythema or rash.  Neurological:     General: No focal deficit present.     Mental Status: He is alert and oriented to person, place, and time. Mental status is at baseline.  Psychiatric:        Mood and Affect: Mood and affect and mood normal.        Behavior: Behavior normal.        Thought Content: Thought content normal.      ED Results / Procedures / Treatments   Labs (all labs ordered are listed, but only abnormal results are displayed) Labs Reviewed  COMPREHENSIVE METABOLIC PANEL - Abnormal; Notable for the following components:      Result Value   Sodium 131 (*)    CO2 20 (*)    Glucose, Bld 130 (*)    Calcium 8.1 (*)    Albumin 2.8 (*)    AST 61 (*)    Alkaline Phosphatase 133 (*)    Total Bilirubin 3.8 (*)    All other components within normal limits  CBC - Abnormal; Notable for the following components:   WBC 11.1 (*)    RBC 3.54 (*)    Hemoglobin 12.7 (*)    HCT 37.0 (*)    MCV 104.5 (*)    MCH 35.9 (*)    Platelets 88 (*)    All other components within normal limits  URINALYSIS, ROUTINE W REFLEX MICROSCOPIC - Abnormal; Notable for the following components:   Color, Urine AMBER (*)    APPearance CLOUDY (*)    Hgb urine dipstick LARGE (*)    Ketones, ur 5 (*)    Protein, ur  30 (*)    Nitrite POSITIVE (*)    Leukocytes,Ua LARGE (*)  RBC / HPF >50 (*)    WBC, UA >50 (*)    Bacteria, UA MANY (*)    All other components within normal limits  SARS CORONAVIRUS 2 (TAT 6-24 HRS)  LIPASE, BLOOD    EKG EKG Interpretation  Date/Time:  Friday November 30 2020 11:07:29 EST Ventricular Rate:  143 PR Interval:  126 QRS Duration: 76 QT Interval:  270 QTC Calculation: 416 R Axis:   6 Text Interpretation: new Sinus tachycardia Otherwise normal ECG Confirmed by Gwyneth Sprout ([id number]) on 11/30/2020 1:32:41 PM   Radiology No results found.  Procedures Procedures (including critical care time)  Medications Ordered in ED Medications  ondansetron (ZOFRAN) injection 4 mg (has no administration in time range)  lactated ringers bolus 1,000 mL (has no administration in time range)    ED Course  I have reviewed the triage vital signs and the nursing notes.  Pertinent labs & imaging results that were available during my care of the patient were reviewed by me and considered in my medical decision making (see chart for details).    MDM Rules/Calculators/A&P                          59 year old male with a prior history of alcohol abuse and hyponatremia who is presenting today with persistent nausea, vomiting and diarrhea.  He reports the last episode of vomiting was 2 days ago but he has not eaten anything in the last 2 days because he reports things will not stay down.  He continues to have diarrhea he reports between 5-7 episodes per day.  It is not grossly bloody but does occasionally have streaks of blood.  He has no abdominal pain today to suggest diverticulitis, appendicitis.  Patient's LFTs are mildly elevated but more likely chronic from his chronic alcohol use.  Creatinine, hemoglobin without acute changes and mild hyponatremia today of 131.  Patient has a normal anion gap.  He has ongoing thrombocytopenia which is not new.  He is tachycardic today with a  heart rate of 150 which appears to be sinus.  Will give patient IV fluids and Zofran and reassess.  Patient's urine appears to possibly have a UTI however will question him about UTI type symptoms and culture urine.  3:29 PM Pt does complain of frequency and bleeding from the meatus starting yesterday.  No evidence of balanitis today but frank blood present and given UA findings will treat for hemorrhagic cystitis.  Pt given rocephin here.  Repeat HR after 1L is 120 and will give a second.  Pt requesting food and drink.  No signs of alcohol withdrawal at this time.  MDM Number of Diagnoses or Management Options   Amount and/or Complexity of Data Reviewed Clinical lab tests: ordered and reviewed Independent visualization of images, tracings, or specimens: yes    Final Clinical Impression(s) / ED Diagnoses Final diagnoses:  None    Rx / DC Orders ED Discharge Orders    None       Gwyneth Sprout, MD 11/30/20 1530

## 2020-11-30 NOTE — Discharge Instructions (Addendum)
You were evaluated in the Emergency Department and after careful evaluation, we did not find any emergent condition requiring admission or further testing in the hospital.  Your exam/testing today was overall reassuring.  We suspect your symptoms are related to a urinary tract infection.  Please take the Keflex antibiotic as directed.  Drink plenty fluids at home.  You can use the Zofran medicine as needed for nausea.  Please return to the Emergency Department if you experience any worsening of your condition.  Thank you for allowing [place] to be a part of your care.

## 2020-12-01 LAB — URINE CULTURE: Culture: <10000 — AB

## 2021-05-18 ENCOUNTER — Emergency Department (HOSPITAL_COMMUNITY): Payer: Self-pay

## 2021-05-18 ENCOUNTER — Emergency Department (HOSPITAL_COMMUNITY)
Admission: EM | Admit: 2021-05-18 | Discharge: 2021-05-18 | Disposition: A | Discharge status: Home / Self Care | Payer: Self-pay | Attending: Emergency Medicine | Admitting: Emergency Medicine

## 2021-05-18 ENCOUNTER — Other Ambulatory Visit: Payer: Self-pay

## 2021-05-18 DIAGNOSIS — M79672 Pain in left foot: Secondary | ICD-10-CM

## 2021-05-18 DIAGNOSIS — I7090 Unspecified atherosclerosis: Secondary | ICD-10-CM | POA: Insufficient documentation

## 2021-05-18 DIAGNOSIS — R600 Localized edema: Secondary | ICD-10-CM | POA: Insufficient documentation

## 2021-05-18 DIAGNOSIS — X58XXXA Exposure to other specified factors, initial encounter: Secondary | ICD-10-CM | POA: Insufficient documentation

## 2021-05-18 DIAGNOSIS — S90822A Blister (nonthermal), left foot, initial encounter: Secondary | ICD-10-CM | POA: Insufficient documentation

## 2021-05-18 DIAGNOSIS — T148XXA Other injury of unspecified body region, initial encounter: Secondary | ICD-10-CM

## 2021-05-18 LAB — CBC WITH DIFFERENTIAL/PLATELET
Abs Immature Granulocytes: 0.03 10*3/uL (ref 0.00–0.07)
Basophils Absolute: 0 10*3/uL (ref 0.0–0.1)
Basophils Relative: 1 %
Eosinophils Absolute: 0.2 10*3/uL (ref 0.0–0.5)
Eosinophils Relative: 3 %
HCT: 36.1 % — ABNORMAL LOW (ref 39.0–52.0)
Hemoglobin: 11.9 g/dL — ABNORMAL LOW (ref 13.0–17.0)
Immature Granulocytes: 1 %
Lymphocytes Relative: 30 %
Lymphs Abs: 1.8 10*3/uL (ref 0.7–4.0)
MCH: 34.5 pg — ABNORMAL HIGH (ref 26.0–34.0)
MCHC: 33 g/dL (ref 30.0–36.0)
MCV: 104.6 fL — ABNORMAL HIGH (ref 80.0–100.0)
Monocytes Absolute: 1.1 10*3/uL — ABNORMAL HIGH (ref 0.1–1.0)
Monocytes Relative: 17 %
Neutro Abs: 3 10*3/uL (ref 1.7–7.7)
Neutrophils Relative %: 48 %
Platelets: 141 10*3/uL — ABNORMAL LOW (ref 150–400)
RBC: 3.45 MIL/uL — ABNORMAL LOW (ref 4.22–5.81)
RDW: 17.4 % — ABNORMAL HIGH (ref 11.5–15.5)
WBC: 6.1 10*3/uL (ref 4.0–10.5)
nRBC: 0 % (ref 0.0–0.2)

## 2021-05-18 LAB — BASIC METABOLIC PANEL
Anion gap: 7 (ref 5–15)
BUN: 6 mg/dL (ref 6–20)
CO2: 22 mmol/L (ref 22–32)
Calcium: 8.7 mg/dL — ABNORMAL LOW (ref 8.9–10.3)
Chloride: 107 mmol/L (ref 98–111)
Creatinine, Ser: 1.05 mg/dL (ref 0.61–1.24)
GFR, Estimated: >60 mL/min (ref >60)
Glucose, Bld: 96 mg/dL (ref 70–99)
Potassium: 3.6 mmol/L (ref 3.5–5.1)
Sodium: 136 mmol/L (ref 135–145)

## 2021-05-18 MED ORDER — OXYCODONE-ACETAMINOPHEN 5-325 MG PO TABS
1.0000 | ORAL_TABLET | Freq: Once | ORAL | Status: AC
  Administered 2021-05-18: 1 via ORAL
  Filled 2021-05-18: qty 1

## 2021-05-18 MED ORDER — SULFAMETHOXAZOLE-TRIMETHOPRIM 800-160 MG PO TABS: 1.0000 | ORAL_TABLET | Freq: Two times a day (BID) | ORAL | 0 refills | Status: AC

## 2021-05-18 MED ORDER — NAPROXEN 500 MG PO TABS: 500.0000 mg | ORAL_TABLET | Freq: Two times a day (BID) | ORAL | 0 refills | Status: DC
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
## 2021-05-18 NOTE — Discharge Instructions (Addendum)
Take antibiotics as prescribed.  Take the entire course, even if symptoms improve. Take naproxen 2 times a day with meals.  Do not take other anti-inflammatories at the same time (Advil, Motrin, ibuprofen, Aleve). You may supplement with Tylenol if you need further pain control. Use the postop shoe and crutches as needed for pain control. Use ice to help with pain. Keep your foot elevated. Call the podiatrist, Dr. Marylene Land, listed below to set up a follow-up appointment.  This is extremely important as I am concerned about your foot healing well. On your x-ray, it was noted that you have a lot of calcium and plaque buildup in your vessels.  This means that you are at higher risk for chronic wounds and vascular problems.  It is important that you establish with a primary care doctor for further evaluation and management. Return to the emergency room if you develop high fevers, severe worsening pain, pus draining from the area, confusion/weakness, redness streaking up your leg, or any new, concerning symptoms
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
## 2021-05-18 NOTE — ED Notes (Signed)
Patient discharge instructions and prescriptions reviewed with the patient. The patient verbalized understanding of instructions. Patient discharged. ?

## 2021-09-18 ENCOUNTER — Other Ambulatory Visit: Payer: Self-pay

## 2021-09-18 ENCOUNTER — Emergency Department (HOSPITAL_COMMUNITY)
Admission: EM | Admit: 2021-09-18 | Discharge: 2021-09-18 | Disposition: A | Discharge status: Home / Self Care | Payer: Self-pay | Attending: Emergency Medicine | Admitting: Emergency Medicine

## 2021-09-18 ENCOUNTER — Encounter (HOSPITAL_COMMUNITY): Payer: Self-pay

## 2021-09-18 DIAGNOSIS — N3001 Acute cystitis with hematuria: Secondary | ICD-10-CM | POA: Insufficient documentation

## 2021-09-18 LAB — URINALYSIS, ROUTINE W REFLEX MICROSCOPIC
Bilirubin Urine: NEGATIVE
Glucose, UA: 50 mg/dL — AB
Ketones, ur: NEGATIVE mg/dL
Nitrite: NEGATIVE
Protein, ur: NEGATIVE mg/dL
Specific Gravity, Urine: 1.01 (ref 1.005–1.030)
WBC, UA: >50 WBC/hpf — ABNORMAL HIGH (ref 0–5)
pH: 5 (ref 5.0–8.0)

## 2021-09-18 MED ORDER — CEPHALEXIN 500 MG PO CAPS: 500.0000 mg | ORAL_CAPSULE | Freq: Four times a day (QID) | ORAL | 0 refills | Status: AC

## 2021-09-18 MED ORDER — CEPHALEXIN 250 MG PO CAPS
500.0000 mg | ORAL_CAPSULE | Freq: Once | ORAL | Status: AC
  Administered 2021-09-18: 500 mg via ORAL
  Filled 2021-09-18: qty 2

## 2021-09-18 NOTE — Discharge Instructions (Addendum)
You were diagnosed with a likely urinary tract infection today.  We will send a urine culture to the lab to confirm this in the next few days.  In the meantime, we started you on antibiotic.  We gave you a dose in the ER.  Your next dose is this afternoon.   Spread your doses throughout the day evenly, 4 times per day.

## 2021-09-18 NOTE — ED Provider Notes (Signed)
[facility] EMERGENCY DEPARTMENT Provider Note   CSN: [id number] Arrival date & time: 09/18/21  9381     History Chief Complaint  Patient presents with   Urinary Frequency    [name] is a 60 y.o. male presenting to the emergency department with urinary frequency onset last night.  She reports needing to "use the bathroom every 5 minutes".  He denies any dysuria, gross hematuria, flank pain, fevers or chills.  He believes that he may have had UTIs in the past, cannot recall the details of that.  He denies any history of kidney stones.  He denies any flank pain or abdominal pain.  Denies nausea vomiting or diarrhea.  He is not currently on antibiotics.  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   Hyponatremia    ETOH abuse    Leukocytosis 04/04/2017   Elevated transaminase level 04/04/2017    Past Surgical History:  Procedure Laterality Date   HERNIA REPAIR         Family History  Problem Relation Age of Onset   Cirrhosis Neg Hx    Liver disease Neg Hx    Cancer Neg Hx     Social History   Tobacco Use   Smoking status: Never   Smokeless tobacco: Never  Vaping Use   Vaping Use: Never used  Substance Use Topics   Alcohol use: Yes    Alcohol/week: 7.0 standard drinks    Types: 7 Cans of beer per week    Comment: Drinks a "40 and a half per day   Drug use: No    Home Medications Prior to Admission medications   Medication Sig Start Date End Date Taking? Authorizing Provider  cephALEXin (KEFLEX) 500 MG capsule Take 1 capsule (500 mg total) by mouth 4 (four) times daily for 7 days. 09/18/21  09/25/21 Yes Lacy Sofia, Kermit Balo, MD  folic acid (FOLVITE) 1 MG tablet Take 1 tablet (1 mg total) by mouth daily. Patient not taking: No sig reported 02/14/20   Joseph Art, DO  Multiple Vitamin (MULTIVITAMIN WITH MINERALS) TABS tablet Take 1 tablet by mouth daily. Patient not taking: No sig reported 02/14/20   Joseph Art, DO  naproxen (NAPROSYN) 500 MG tablet Take 1 tablet (500 mg total) by mouth 2 (two) times daily with a meal. 05/18/21   Caccavale, Sophia, PA-C  ondansetron (ZOFRAN ODT) 4 MG disintegrating tablet Take 1 tablet (4 mg total) by mouth every 8 (eight) hours as needed for nausea or vomiting. 11/30/20   Sabas Sous, MD  thiamine 100 MG tablet Take 1 tablet (100 mg total) by mouth daily. Patient not taking: No sig reported 02/13/20   Joseph Art, DO    Allergies    Patient has no known allergies.  Review of Systems   Review of Systems  Constitutional:  Negative for chills and fever.  Respiratory:  Negative for cough and shortness of breath.   Cardiovascular:  Negative for chest pain and palpitations.  Gastrointestinal:  Negative for abdominal pain and vomiting.  Genitourinary:  Positive for frequency. Negative for difficulty urinating and  hematuria.  Musculoskeletal:  Negative for arthralgias and back pain.  Skin:  Negative for color change and rash.  Neurological:  Negative for syncope and headaches.  All other systems reviewed and are negative.  Physical Exam Updated Vital Signs BP (!) 115/93   Pulse 98   Temp 97.8 F (36.6 C) (Oral)   Resp 16   Ht 5\' 10"  (1.778 m)   SpO2 100%   BMI 18.91 kg/m   Physical Exam Constitutional:      General: He is not in acute distress. HENT:     Head: Normocephalic and atraumatic.  Eyes:     Conjunctiva/sclera: Conjunctivae normal.     Pupils: Pupils are equal, round, and reactive to light.  Cardiovascular:     Rate and Rhythm: Normal rate and regular rhythm.  Pulmonary:     Effort: Pulmonary effort is normal.  No respiratory distress.  Abdominal:     General: There is no distension.     Tenderness: There is no abdominal tenderness. There is no guarding.  Skin:    General: Skin is warm and dry.  Neurological:     General: No focal deficit present.     Mental Status: He is alert. Mental status is at baseline.  Psychiatric:        Mood and Affect: Mood normal.        Behavior: Behavior normal.    ED Results / Procedures / Treatments   Labs (all labs ordered are listed, but only abnormal results are displayed) Labs Reviewed  URINALYSIS, ROUTINE W REFLEX MICROSCOPIC - Abnormal; Notable for the following components:      Result Value   Color, Urine AMBER (*)    APPearance CLOUDY (*)    Glucose, UA 50 (*)    Hgb urine dipstick MODERATE (*)    Leukocytes,Ua MODERATE (*)    WBC, UA >50 (*)    Bacteria, UA FEW (*)    All other components within normal limits  URINE CULTURE    EKG None  Radiology No results found.  Procedures Procedures   Medications Ordered in ED Medications  cephALEXin (KEFLEX) capsule 500 mg (has no administration in time range)    ED Course  I have reviewed the triage vital signs and the nursing notes.  Pertinent labs & imaging results that were available during my care of the patient were reviewed by me and considered in my medical decision making (see chart for details).  Differential diagnosis includes UTI versus kidney stone versus other.  He has no CVA tenderness, abdominal pain, or any complaint of pain.  Have a lower suspicion for a kidney stone.  Likely if he is passing a stone it would be very tiny and likely to pass on its own.  I don't think he needs emergent CT imaging at this time.  I personally reviewed and interpreted his UA, which is positive for leukocytes and white blood cells and hemoglobin.  This could be consistent with acute cystitis.  No evidence of urosepsis or pyelonephritis per his clinical exam.  He is well-appearing otherwise.  I  reviewed his prior medical records and his kidney function was normal on last check a few months ago in June 2022..  I think it be reasonable to start him on Keflex 500 mg 4 times a day for 7 days for male UTI.  We will give him his first dose in the ER.  He verbalized understanding of this plan.  Final Clinical Impression(s) / ED Diagnoses  Final diagnoses:  Acute cystitis with hematuria    Rx / DC Orders ED Discharge Orders          Ordered    cephALEXin (KEFLEX) 500 MG capsule  4 times daily        09/18/21 1017             Terald Sleeper, MD 09/18/21 1021

## 2021-09-18 NOTE — ED Triage Notes (Signed)
Pt came POV c/o frequent urination x 2 days

## 2021-09-19 LAB — URINE CULTURE: Special Requests: NORMAL

## 2021-09-25 ENCOUNTER — Inpatient Hospital Stay (HOSPITAL_COMMUNITY)
Admission: EM | Admit: 2021-09-25 | Discharge: 2021-09-30 | DRG: 853 | Disposition: A | Discharge status: Home / Self Care | Payer: Self-pay | Attending: Internal Medicine | Admitting: Internal Medicine

## 2021-09-25 ENCOUNTER — Emergency Department (HOSPITAL_COMMUNITY): Payer: Self-pay

## 2021-09-25 ENCOUNTER — Other Ambulatory Visit: Payer: Self-pay

## 2021-09-25 DIAGNOSIS — Z419 Encounter for procedure for purposes other than remedying health state, unspecified: Secondary | ICD-10-CM

## 2021-09-25 DIAGNOSIS — K651 Peritoneal abscess: Secondary | ICD-10-CM | POA: Diagnosis present

## 2021-09-25 DIAGNOSIS — Z87440 Personal history of urinary (tract) infections: Secondary | ICD-10-CM

## 2021-09-25 DIAGNOSIS — K760 Fatty (change of) liver, not elsewhere classified: Secondary | ICD-10-CM | POA: Diagnosis present

## 2021-09-25 DIAGNOSIS — I700 Atherosclerosis of aorta: Secondary | ICD-10-CM | POA: Diagnosis present

## 2021-09-25 DIAGNOSIS — R6520 Severe sepsis without septic shock: Secondary | ICD-10-CM | POA: Diagnosis present

## 2021-09-25 DIAGNOSIS — E876 Hypokalemia: Secondary | ICD-10-CM

## 2021-09-25 DIAGNOSIS — N320 Bladder-neck obstruction: Secondary | ICD-10-CM | POA: Diagnosis present

## 2021-09-25 DIAGNOSIS — Z79899 Other long term (current) drug therapy: Secondary | ICD-10-CM

## 2021-09-25 DIAGNOSIS — E860 Dehydration: Secondary | ICD-10-CM | POA: Diagnosis present

## 2021-09-25 DIAGNOSIS — F1090 Alcohol use, unspecified, uncomplicated: Secondary | ICD-10-CM | POA: Diagnosis present

## 2021-09-25 DIAGNOSIS — E872 Acidosis, unspecified: Secondary | ICD-10-CM | POA: Diagnosis present

## 2021-09-25 DIAGNOSIS — N400 Enlarged prostate without lower urinary tract symptoms: Secondary | ICD-10-CM | POA: Diagnosis present

## 2021-09-25 DIAGNOSIS — L0291 Cutaneous abscess, unspecified: Secondary | ICD-10-CM

## 2021-09-25 DIAGNOSIS — K649 Unspecified hemorrhoids: Secondary | ICD-10-CM | POA: Diagnosis present

## 2021-09-25 DIAGNOSIS — N412 Abscess of prostate: Secondary | ICD-10-CM

## 2021-09-25 DIAGNOSIS — K529 Noninfective gastroenteritis and colitis, unspecified: Secondary | ICD-10-CM | POA: Diagnosis present

## 2021-09-25 DIAGNOSIS — N390 Urinary tract infection, site not specified: Secondary | ICD-10-CM

## 2021-09-25 DIAGNOSIS — N179 Acute kidney failure, unspecified: Secondary | ICD-10-CM

## 2021-09-25 DIAGNOSIS — R197 Diarrhea, unspecified: Secondary | ICD-10-CM

## 2021-09-25 DIAGNOSIS — R6521 Severe sepsis with septic shock: Secondary | ICD-10-CM

## 2021-09-25 DIAGNOSIS — Z20822 Contact with and (suspected) exposure to covid-19: Secondary | ICD-10-CM | POA: Diagnosis present

## 2021-09-25 DIAGNOSIS — A419 Sepsis, unspecified organism: Principal | ICD-10-CM

## 2021-09-25 DIAGNOSIS — Z8673 Personal history of transient ischemic attack (TIA), and cerebral infarction without residual deficits: Secondary | ICD-10-CM

## 2021-09-25 HISTORY — DX: Heart failure, unspecified: I50.9

## 2021-09-25 LAB — COMPREHENSIVE METABOLIC PANEL
ALT: 19 U/L (ref 0–44)
AST: 49 U/L — ABNORMAL HIGH (ref 15–41)
Albumin: 2.3 g/dL — ABNORMAL LOW (ref 3.5–5.0)
Alkaline Phosphatase: 103 U/L (ref 38–126)
Anion gap: 12 (ref 5–15)
BUN: 8 mg/dL (ref 6–20)
CO2: 17 mmol/L — ABNORMAL LOW (ref 22–32)
Calcium: 8.1 mg/dL — ABNORMAL LOW (ref 8.9–10.3)
Chloride: 106 mmol/L (ref 98–111)
Creatinine, Ser: 1.44 mg/dL — ABNORMAL HIGH (ref 0.61–1.24)
GFR, Estimated: 56 mL/min — ABNORMAL LOW (ref >60)
Glucose, Bld: 126 mg/dL — ABNORMAL HIGH (ref 70–99)
Potassium: 3.3 mmol/L — ABNORMAL LOW (ref 3.5–5.1)
Sodium: 135 mmol/L (ref 135–145)
Total Bilirubin: 0.7 mg/dL (ref 0.3–1.2)
Total Protein: 6.4 g/dL — ABNORMAL LOW (ref 6.5–8.1)

## 2021-09-25 LAB — CBC WITH DIFFERENTIAL/PLATELET
Abs Immature Granulocytes: 0.1 10*3/uL — ABNORMAL HIGH (ref 0.00–0.07)
Basophils Absolute: 0 10*3/uL (ref 0.0–0.1)
Basophils Relative: 0 %
Eosinophils Absolute: 0 10*3/uL (ref 0.0–0.5)
Eosinophils Relative: 0 %
HCT: 35.1 % — ABNORMAL LOW (ref 39.0–52.0)
Hemoglobin: 11.7 g/dL — ABNORMAL LOW (ref 13.0–17.0)
Immature Granulocytes: 1 %
Lymphocytes Relative: 3 %
Lymphs Abs: 0.4 10*3/uL — ABNORMAL LOW (ref 0.7–4.0)
MCH: 34.6 pg — ABNORMAL HIGH (ref 26.0–34.0)
MCHC: 33.3 g/dL (ref 30.0–36.0)
MCV: 103.8 fL — ABNORMAL HIGH (ref 80.0–100.0)
Monocytes Absolute: 0.4 10*3/uL (ref 0.1–1.0)
Monocytes Relative: 3 %
Neutro Abs: 9.9 10*3/uL — ABNORMAL HIGH (ref 1.7–7.7)
Neutrophils Relative %: 93 %
Platelets: 219 10*3/uL (ref 150–400)
RBC: 3.38 MIL/uL — ABNORMAL LOW (ref 4.22–5.81)
RDW: 13.4 % (ref 11.5–15.5)
WBC: 10.8 10*3/uL — ABNORMAL HIGH (ref 4.0–10.5)
nRBC: 0 % (ref 0.0–0.2)

## 2021-09-25 LAB — URINALYSIS, ROUTINE W REFLEX MICROSCOPIC
Bilirubin Urine: NEGATIVE
Glucose, UA: NEGATIVE mg/dL
Ketones, ur: NEGATIVE mg/dL
Nitrite: NEGATIVE
Protein, ur: NEGATIVE mg/dL
Specific Gravity, Urine: 1.004 — ABNORMAL LOW (ref 1.005–1.030)
WBC, UA: >50 WBC/hpf — ABNORMAL HIGH (ref 0–5)
pH: 6 (ref 5.0–8.0)

## 2021-09-25 LAB — APTT: aPTT: 29 seconds (ref 24–36)

## 2021-09-25 LAB — LIPASE, BLOOD: Lipase: 17 U/L (ref 11–51)

## 2021-09-25 LAB — PROTIME-INR
INR: 1.5 — ABNORMAL HIGH (ref 0.8–1.2)
Prothrombin Time: 18 seconds — ABNORMAL HIGH (ref 11.4–15.2)

## 2021-09-25 LAB — RESP PANEL BY RT-PCR (FLU A&B, COVID) ARPGX2
Influenza A by PCR: NEGATIVE
Influenza B by PCR: NEGATIVE
SARS Coronavirus 2 by RT PCR: NEGATIVE

## 2021-09-25 LAB — LACTIC ACID, PLASMA
Lactic Acid, Venous: 6.9 mmol/L (ref 0.5–1.9)
Lactic Acid, Venous: 7 mmol/L (ref 0.5–1.9)

## 2021-09-25 MED ORDER — LACTATED RINGERS IV BOLUS (SEPSIS)
1000.0000 mL | Freq: Once | INTRAVENOUS | Status: AC
  Administered 2021-09-25: 1000 mL via INTRAVENOUS

## 2021-09-25 MED ORDER — VANCOMYCIN HCL IN DEXTROSE 1-5 GM/200ML-% IV SOLN
1000.0000 mg | INTRAVENOUS | Status: DC
  Administered 2021-09-26: 1000 mg via INTRAVENOUS
  Filled 2021-09-25 (×2): qty 200

## 2021-09-25 MED ORDER — ACETAMINOPHEN 500 MG PO TABS
1000.0000 mg | ORAL_TABLET | Freq: Once | ORAL | Status: AC
  Administered 2021-09-25: 1000 mg via ORAL
  Filled 2021-09-25: qty 2

## 2021-09-25 MED ORDER — METRONIDAZOLE 500 MG/100ML IV SOLN
500.0000 mg | Freq: Once | INTRAVENOUS | Status: AC
  Administered 2021-09-25: 500 mg via INTRAVENOUS
  Filled 2021-09-25: qty 100

## 2021-09-25 MED ORDER — LACTATED RINGERS IV BOLUS
1000.0000 mL | Freq: Once | INTRAVENOUS | Status: AC
  Administered 2021-09-25: 1000 mL via INTRAVENOUS

## 2021-09-25 MED ORDER — SODIUM CHLORIDE 0.9 % IV SOLN
2.0000 g | Freq: Two times a day (BID) | INTRAVENOUS | Status: DC
  Administered 2021-09-26: 2 g via INTRAVENOUS
  Filled 2021-09-25 (×2): qty 2

## 2021-09-25 MED ORDER — VANCOMYCIN HCL 1250 MG/250ML IV SOLN
1250.0000 mg | Freq: Once | INTRAVENOUS | Status: AC
  Administered 2021-09-25: 1250 mg via INTRAVENOUS
  Filled 2021-09-25: qty 250

## 2021-09-25 MED ORDER — SODIUM CHLORIDE 0.9 % IV SOLN
2.0000 g | Freq: Once | INTRAVENOUS | Status: AC
Start: 2021-09-25 — End: 2021-09-25
  Administered 2021-09-25: 2 g via INTRAVENOUS
  Filled 2021-09-25: qty 2

## 2021-09-25 MED ORDER — LACTATED RINGERS IV SOLN: INTRAVENOUS | Status: AC

## 2021-09-25 MED ORDER — SODIUM CHLORIDE 0.9 % IV BOLUS
1000.0000 mL | Freq: Once | INTRAVENOUS | Status: AC
  Administered 2021-09-25: 1000 mL via INTRAVENOUS

## 2021-09-25 MED ORDER — THIAMINE HCL 100 MG/ML IJ SOLN
100.0000 mg | Freq: Every day | INTRAMUSCULAR | Status: DC
  Administered 2021-09-25 – 2021-09-30 (×5): 100 mg via INTRAVENOUS
  Filled 2021-09-25 (×5): qty 2

## 2021-09-25 NOTE — ED Notes (Signed)
Patient denies any diarrhea

## 2021-09-25 NOTE — Sepsis Progress Note (Signed)
Elink following Code Sepsis. 

## 2021-09-25 NOTE — Sepsis Progress Note (Signed)
Confirmed with Bedside RN that blood cultures were collected  before abx were given

## 2021-09-25 NOTE — Consult Note (Signed)
NAME:  [name], MRN:  [id number], DOB:  [birth year], LOS: 0 ADMISSION DATE:  09/25/2021, CONSULTATION DATE:  09/26/2021  REFERRING MD:  Arthor Captain, PA ED , CHIEF COMPLAINT: Lactic acidosis  History of Present Illness:  60 year old man with EtOH abuse brought in by EMS complaining of diarrhea and frequent urination.  He was seen in the ED on 10/19, diagnosed with a UTI, urine culture showed multiple organisms, given Keflex.  He normally drinks 4 cans of beer daily but stopped drinking a week ago when he was on the antibiotic. Initially noted to be febrile 101.9, hypotensive and tachycardia, given 2 L of fluid , initial lactate was 6.9 and repeat lactate 3 hours later was 7.0, hence PCCM consulted. He was given empiric cefepime and vancomycin and Flagyl All the labs significant for mild hypokalemia, creatinine 1.4, borderline leukocytosis 10.8 with left shift  Pertinent  Medical History  CVA no residual EtOH abuse Prior UTI  Significant Hospital Events: Including procedures, antibiotic start and stop dates in addition to other pertinent events     Interim History / Subjective:    Objective   Blood pressure (!) 88/69, pulse (!) 111, temperature 100.1 F (37.8 C), temperature source Oral, resp. rate (!) 25, SpO2 98 %.       No intake or output data in the 24 hours ending 09/25/21 2314 There were no vitals filed for this visit.  Examination: General: Middle-age man, lying supine in bed, no distress HENT: Mild pallor, no icterus, no JVD, dry mucosa Lungs: Clear lungs bilateral, no accessory muscle use Cardiovascular: S1-S2 regular, no murmur Abdomen: Soft, nontender, no hepatosplenomegaly Extremities: No deformity overall good capillary refill Neuro: Alert, interactive, nonfocal   Resolved Hospital Problem list     Assessment & Plan:  He does seem to have an infection as indicated by fever and left shift , he continues to have greater than 50 white cells in his  urine.  He was treated with Keflex so concern here would be for resistant UTI.  Prior imaging in 2018 does not show any abnormality of his urinary tract and he does not give obvious symptoms of prostatitis.  His blood pressure and tachycardia is improved with fluids , but lactate is persistent which may indicate decreased clearance due to liver disease.  No other reason for lactate is evident on clinical exam.  He appears volume depleted in spite of 2 L of fluid which may also be due to diarrhea  Septic shock, occult Lactic acidosis UTI, suspect resistant organism, failed outpatient treatment with Keflex  -Continue volume repletion -Empiric cefepime -Repeat lactate every 3 hours until downward trend demonstrated -Renal ultrasound to rule out hydronephrosis, renal calculi -Await culture data on simplify antibiotics accordingly  AKI - follow,baseline cr 1.2 range  Watch for alcohol withdrawal, last drink 1 week ago  Can be admitted by [facility] to stepdown unit, PCCM will follow     Labs   CBC: Recent Labs  Lab 09/25/21 1906  WBC 10.8*  NEUTROABS 9.9*  HGB 11.7*  HCT 35.1*  MCV 103.8*  PLT 219    Basic Metabolic Panel: Recent Labs  Lab 09/25/21 1906  NA 135  K 3.3*  CL 106  CO2 17*  GLUCOSE 126*  BUN 8  CREATININE 1.44*  CALCIUM 8.1*   GFR: Estimated Creatinine Clearance: 49.6 mL/min (A) (by C-G formula based on SCr of 1.44 mg/dL (H)). Recent Labs  Lab 09/25/21 1906 09/25/21 2145  WBC 10.8*  --  LATICACIDVEN 6.9* 7.0*    Liver Function Tests: Recent Labs  Lab 09/25/21 1906  AST 49*  ALT 19  ALKPHOS 103  BILITOT 0.7  PROT 6.4*  ALBUMIN 2.3*   Recent Labs  Lab 09/25/21 1906  LIPASE 17   No results for input(s): AMMONIA in the last 168 hours.  ABG    Component Value Date/Time   TCO2 25 01/17/2009 0149     Coagulation Profile: Recent Labs  Lab 09/25/21 2033  INR 1.5*    Cardiac Enzymes: No results for input(s): CKTOTAL, CKMB, CKMBINDEX,  TROPONINI in the last 168 hours.  HbA1C: Hgb A1c MFr Bld  Date/Time Value Ref Range Status  02/10/2020 06:01 AM 4.6 (L) 4.8 - 5.6 % Final    Comment:    (NOTE) Pre diabetes:          5.7%-6.4% Diabetes:              >6.4% Glycemic control for   <7.0% adults with diabetes     CBG: No results for input(s): GLUCAP in the last 168 hours.  Review of Systems:   Constitutional: negative for anorexia, fevers and sweats  Eyes: negative for irritation, redness and visual disturbance  Ears, nose, mouth, throat, and face: negative for earaches, epistaxis, nasal congestion and sore throat  Respiratory: negative for cough, dyspnea on exertion, sputum and wheezing  Cardiovascular: negative for chest pain, dyspnea, lower extremity edema, orthopnea, palpitations and syncope  Gastrointestinal: negative for abdominal pain, constipation, melena, nausea and vomiting  Genitourinary:positive for dysuria, frequency NO hematuria  Hematologic/lymphatic: negative for bleeding, easy bruising and lymphadenopathy  Musculoskeletal:negative for arthralgias, muscle weakness and stiff joints  Neurological: negative for coordination problems, gait problems, headaches and weakness  Endocrine: negative for diabetic symptoms including polydipsia, polyuria and weight loss   Past Medical History:  He,  has a past medical history of ETOH abuse and Tick-borne disease (04/08/2017).   Surgical History:   Past Surgical History:  Procedure Laterality Date   HERNIA REPAIR       Social History:   reports that he has never smoked. He has never used smokeless tobacco. He reports current alcohol use of about 7.0 standard drinks per week. He reports that he does not use drugs.   Family History:  His family history is negative for Cirrhosis, Liver disease, and Cancer.   Allergies No Known Allergies   Home Medications  Prior to Admission medications   Medication Sig Start Date End Date Taking? Authorizing Provider   cephALEXin (KEFLEX) 500 MG capsule Take 1 capsule (500 mg total) by mouth 4 (four) times daily for 7 days. 09/18/21 09/25/21  Terald Sleeper, MD  folic acid (FOLVITE) 1 MG tablet Take 1 tablet (1 mg total) by mouth daily. Patient not taking: No sig reported 02/14/20   Joseph Art, DO  Multiple Vitamin (MULTIVITAMIN WITH MINERALS) TABS tablet Take 1 tablet by mouth daily. Patient not taking: No sig reported 02/14/20   Joseph Art, DO  naproxen (NAPROSYN) 500 MG tablet Take 1 tablet (500 mg total) by mouth 2 (two) times daily with a meal. 05/18/21   Caccavale, Sophia, PA-C  ondansetron (ZOFRAN ODT) 4 MG disintegrating tablet Take 1 tablet (4 mg total) by mouth every 8 (eight) hours as needed for nausea or vomiting. 11/30/20   Sabas Sous, MD  thiamine 100 MG tablet Take 1 tablet (100 mg total) by mouth daily. Patient not taking: No sig reported 02/13/20   Joseph Art,  DO     Cyril Mourning MD. FCCP. [facility] Pager : [phone]  If no response to pager , please call [phone] until 7 pm After 7:00 pm call Elink  [phone]   09/26/2021

## 2021-09-25 NOTE — Progress Notes (Signed)
Pharmacy Antibiotic Note  [name] is a 60 y.o. male admitted on 09/25/2021 with sepsis.  Pharmacy has been consulted for vancomycin and cefepime dosing.  Plan: Vancomycin 1250mg  IV x1 then 1000mg  IV q24h (eAUC 479, Cr 1.44, Vd 0.72) Cefepime 2g IV q12h -Monitor renal function, clinical status, and antibiotic plan -Order vanc levels as necessary   Temp (24hrs), Avg:101.9 F (38.8 C), Min:101.9 F (38.8 C), Max:101.9 F (38.8 C)  Recent Labs  Lab 09/25/21 1906  WBC 10.8*  CREATININE 1.44*    Estimated Creatinine Clearance: 49.6 mL/min (A) (by C-G formula based on SCr of 1.44 mg/dL (H)).    No Known Allergies  Antimicrobials this admission: Vanc 10/26 >>  Cefepime 10/26 >>  Flagyl x1  Thank you for allowing pharmacy to be a part of this patient's care.  11/26, PharmD, [facility] Emergency Medicine Clinical Pharmacist ED RPh Phone: [phone] Main RX: [phone]

## 2021-09-25 NOTE — ED Provider Notes (Signed)
[facility] EMERGENCY DEPARTMENT Provider Note   CSN: [id number] Arrival date & time: 09/25/21  6073     History No chief complaint on file.   [name] is a 60 y.o. male With a pmh of ETOH abuse who presents via EMS for diarrhea. Patient states that he has been urinating a lot and  has had "the runs." Review of EMR shows that  he was diagnose with a UTI on 10/19 and is currently taking Keflex QID. Patient  states that he normally drinks 1 40oz malt beverage a day but has not because "you can't mix it with the medicine." Ems reports that he was hypotensive and tachycardic when the found him. He as given fluids PTA and they thought he might be in ETOH withdrawal. Patient is notably tachycardic and tachypneic. He denies racing or skipping in his heart. He denies nausea, abdominal pain, or vomiting.   The history is provided by the patient and the EMS personnel. History limited by: PATIENT IS A POOR HISTORIAN.      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   Hyponatremia    ETOH abuse    Leukocytosis 04/04/2017   Elevated transaminase level 04/04/2017    Past Surgical History:  Procedure Laterality Date   HERNIA REPAIR         Family History  Problem Relation Age of Onset   Cirrhosis Neg Hx    Liver disease Neg Hx    Cancer Neg Hx     Social History   Tobacco Use   Smoking status: Never   Smokeless tobacco: Never  Vaping Use   Vaping Use: Never used  Substance Use Topics   Alcohol use: Yes    Alcohol/week: 7.0 standard drinks    Types: 7 Cans of beer per week    Comment: Drinks a "40 and a half per day    Drug use: No    Home Medications Prior to Admission medications   Medication Sig Start Date End Date Taking? Authorizing Provider  cephALEXin (KEFLEX) 500 MG capsule Take 1 capsule (500 mg total) by mouth 4 (four) times daily for 7 days. 09/18/21 09/25/21  Terald Sleeper, MD  folic acid (FOLVITE) 1 MG tablet Take 1 tablet (1 mg total) by mouth daily. Patient not taking: No sig reported 02/14/20   Joseph Art, DO  Multiple Vitamin (MULTIVITAMIN WITH MINERALS) TABS tablet Take 1 tablet by mouth daily. Patient not taking: No sig reported 02/14/20   Joseph Art, DO  naproxen (NAPROSYN) 500 MG tablet Take 1 tablet (500 mg total) by mouth 2 (two) times daily with a meal. 05/18/21   Caccavale, Sophia, PA-C  ondansetron (ZOFRAN ODT) 4 MG disintegrating tablet Take 1 tablet (4 mg total) by mouth every 8 (eight) hours as needed for nausea or vomiting. 11/30/20   Sabas Sous, MD  thiamine 100 MG tablet Take 1 tablet (100 mg total) by mouth daily. Patient not taking: No sig reported 02/13/20   Joseph Art, DO    Allergies    Patient has no known allergies.  Review of Systems   Review  of Systems Ten systems reviewed and are negative for acute change, except as noted in the HPI.   Physical Exam Updated Vital Signs BP 97/69   Pulse (!) 111   Temp 100.1 F (37.8 C) (Oral)   Resp (!) 27   SpO2 97%   Physical Exam Vitals and nursing note reviewed.  Constitutional:      General: He is not in acute distress.    Appearance: He is well-developed. He is not diaphoretic.  HENT:     Head: Normocephalic and atraumatic.  Eyes:     General: No scleral icterus.    Conjunctiva/sclera: Conjunctivae normal.  Cardiovascular:     Rate and Rhythm: Regular rhythm. Tachycardia present.     Heart sounds: Normal heart sounds.  Pulmonary:     Effort: Pulmonary effort is normal. Tachypnea present. No respiratory distress.     Breath sounds: Normal breath sounds. No decreased air movement.   Abdominal:     Palpations: Abdomen is soft.     Tenderness: There is no abdominal tenderness. There is no guarding.  Musculoskeletal:     Cervical back: Normal range of motion and neck supple.  Skin:    General: Skin is warm and dry.     Comments: HOT TO TOUCH  Neurological:     Mental Status: He is alert.  Psychiatric:        Behavior: Behavior normal.    ED Results / Procedures / Treatments   Labs (all labs ordered are listed, but only abnormal results are displayed) Labs Reviewed  CBC WITH DIFFERENTIAL/PLATELET - Abnormal; Notable for the following components:      Result Value   WBC 10.8 (*)    RBC 3.38 (*)    Hemoglobin 11.7 (*)    HCT 35.1 (*)    MCV 103.8 (*)    MCH 34.6 (*)    Neutro Abs 9.9 (*)    Lymphs Abs 0.4 (*)    Abs Immature Granulocytes 0.10 (*)    All other components within normal limits  COMPREHENSIVE METABOLIC PANEL - Abnormal; Notable for the following components:   Potassium 3.3 (*)    CO2 17 (*)    Glucose, Bld 126 (*)    Creatinine, Ser 1.44 (*)    Calcium 8.1 (*)    Total Protein 6.4 (*)    Albumin 2.3 (*)    AST 49 (*)    GFR, Estimated 56 (*)    All other components within normal limits  URINALYSIS, ROUTINE W REFLEX MICROSCOPIC - Abnormal; Notable for the following components:   APPearance HAZY (*)    Specific Gravity, Urine 1.004 (*)    Hgb urine dipstick MODERATE (*)    Leukocytes,Ua LARGE (*)    WBC, UA >50 (*)    Bacteria, UA FEW (*)    All other components within normal limits  LACTIC ACID, PLASMA - Abnormal; Notable for the following components:   Lactic Acid, Venous 6.9 (*)    All other components within normal limits  PROTIME-INR - Abnormal; Notable for the following components:   Prothrombin Time 18.0 (*)    INR 1.5 (*)    All other components within normal limits  LACTIC ACID, PLASMA - Abnormal; Notable for the following components:   Lactic Acid, Venous 7.0 (*)    All other components within normal limits  RESP PANEL  BY RT-PCR (FLU A&B, COVID) [id number]  GASTROINTESTINAL PANEL BY PCR, STOOL (REPLACES STOOL CULTURE)  C DIFFICILE QUICK SCREEN W PCR  REFLEX    CULTURE, BLOOD (ROUTINE X 2)  CULTURE, BLOOD (ROUTINE X 2)  URINE CULTURE  LIPASE, BLOOD  APTT  LACTIC ACID, PLASMA    EKG EKG Interpretation  Date/Time:  Wednesday September 25 2021 19:57:25 EDT Ventricular Rate:  149 PR Interval:  139 QRS Duration: 95 QT Interval:  269 QTC Calculation: 424 R Axis:   192 Text Interpretation: Sinus tachycardia Low voltage, precordial leads Consider right ventricular hypertrophy LVH by voltage Nonspecific T abnormalities, diffuse leads Since last tracing Rate faster likely rate related ST/T changes Confirmed by Susy Frizzle [phone]) on 09/25/2021 8:09:18 PM  Radiology DG Chest Port 1 View  Result Date: 09/25/2021 CLINICAL DATA:  Questionable sepsis. EXAM: PORTABLE CHEST 1 VIEW COMPARISON:  Chest radiograph dated 04/04/2017. FINDINGS: No focal consolidation, pleural effusion, or pneumothorax. The cardiac silhouette is within normal limits. No acute osseous pathology. IMPRESSION: No active disease. Electronically Signed   By: Elgie Collard M.D.   On: 09/25/2021 20:12    Procedures .Critical Care E&M Performed by: Arthor Captain, PA-C  Critical care provider statement:    Critical care time (minutes):  60   Critical care time was exclusive of:  Separately billable procedures and treating other patients   Critical care was necessary to treat or prevent imminent or life-threatening deterioration of the following conditions:  Sepsis   Critical care was time spent personally by me on the following activities:  Review of old charts, re-evaluation of patient's condition, pulse oximetry, ordering and review of radiographic studies, ordering and review of laboratory studies, ordering and performing treatments and interventions, obtaining history from patient or surrogate, interpretation of cardiac output  measurements, examination of patient, evaluation of patient's response to treatment, discussions with consultants and development of treatment plan with patient or surrogate After initial E/M assessment, critical care services were subsequently performed that were exclusive of separately billable procedures or treatment.     Medications Ordered in ED Medications  thiamine (B-1) injection 100 mg (100 mg Intravenous Given 09/25/21 1945)  lactated ringers infusion ( Intravenous New Bag/Given 09/25/21 2240)  ceFEPIme (MAXIPIME) 2 g in sodium chloride 0.9 % 100 mL IVPB (has no administration in time range)  vancomycin (VANCOCIN) IVPB 1000 mg/200 mL premix (has no administration in time range)  sodium chloride 0.9 % bolus 1,000 mL (0 mLs Intravenous Stopped 09/25/21 2108)  lactated ringers bolus 1,000 mL (0 mLs Intravenous Stopped 09/25/21 2108)  ceFEPIme (MAXIPIME) 2 g in sodium chloride 0.9 % 100 mL IVPB (0 g Intravenous Stopped 09/25/21 2108)  metroNIDAZOLE (FLAGYL) IVPB 500 mg (0 mg Intravenous Stopped 09/25/21 2240)  vancomycin (VANCOREADY) IVPB 1250 mg/250 mL (0 mg Intravenous Stopped 09/25/21 2300)  acetaminophen (TYLENOL) tablet 1,000 mg (1,000 mg Oral Given 09/25/21 2012)    ED Course  I have reviewed the triage vital signs and the nursing notes.  Pertinent labs & imaging results that were available during my care of the patient were reviewed by me and considered in my medical decision making (see chart for details).  Clinical Course as of 09/25/21 2319  Wed Sep 25, 2021  2108 Lactic Acid, Venous(!!): 6.9 [AH]    Clinical Course User Index [AH] Arthor Captain, PA-C   MDM Rules/Calculators/A&P                           11:19 PM Pt arrives febrile, tachycardic, tachypneic, and hypotensive- now normotensive after fluids PTA with EMS. Code sepsis initiated.  Patient arrives  to the emergency department febrile, tachycardic and hypertensive.  I ordered and reviewed labs which show  urine which appears infected, CBC with elevated white blood cell count and left shift, CMP creatinine of 1.44 Avelle elevated above the patient's baseline of 1 representing AKI, patient's lactic acid initially 6.9.  He received 30 mL/kg of fluids and trended up by one-point to seven-point oh after reevaluation.  Patient's PT/INR mildly elevated respiratory panel negative for COVID or influenza. I reviewed a chest x-ray which I ordered.  Plain films show no acute abnormalities.  Patient's EKG shows sinus tachycardia at a rate of 150.  Case discussed with Dr. Vassie Loll of the critical care service who will come to see the patient.  He will make a decision on whether that he the patient will be admitted to his service.  I discussed the case with Dr. Melene Plan who will make sure patient is admitted after critical care consult.  Patient's pressures remain soft.  His map is currently acceptable and fever is improving.  Heart rate is improving.  Patient is critically ill and will need admission. Final Clinical Impression(s) / ED Diagnoses Final diagnoses:  Sepsis due to urinary tract infection [facility])    Rx / DC Orders ED Discharge Orders     None        Arthor Captain, PA-C 09/25/21 2320    Pollyann Savoy, MD 09/26/21 1345

## 2021-09-25 NOTE — ED Notes (Signed)
Patient given sprite per provider approval

## 2021-09-25 NOTE — ED Triage Notes (Signed)
Pt with diarrhea x 2 weeks and reportedly withdrawing from alcohol. Last drink one week ago. Decreased PO intake.

## 2021-09-26 ENCOUNTER — Inpatient Hospital Stay (HOSPITAL_COMMUNITY): Payer: Self-pay | Admitting: Certified Registered Nurse Anesthetist

## 2021-09-26 ENCOUNTER — Encounter (HOSPITAL_COMMUNITY): Payer: Self-pay

## 2021-09-26 ENCOUNTER — Encounter (HOSPITAL_COMMUNITY)
Admission: EM | Disposition: A | Discharge status: Home / Self Care | Payer: Self-pay | Source: Home / Self Care | Attending: Internal Medicine

## 2021-09-26 ENCOUNTER — Emergency Department (HOSPITAL_COMMUNITY): Payer: Self-pay

## 2021-09-26 ENCOUNTER — Inpatient Hospital Stay (HOSPITAL_COMMUNITY): Payer: Self-pay

## 2021-09-26 DIAGNOSIS — A419 Sepsis, unspecified organism: Secondary | ICD-10-CM

## 2021-09-26 DIAGNOSIS — R6520 Severe sepsis without septic shock: Secondary | ICD-10-CM | POA: Diagnosis present

## 2021-09-26 DIAGNOSIS — N390 Urinary tract infection, site not specified: Secondary | ICD-10-CM

## 2021-09-26 DIAGNOSIS — E876 Hypokalemia: Secondary | ICD-10-CM

## 2021-09-26 HISTORY — PX: CYSTOSCOPY WITH URETERAL RESECTION: SHX6747

## 2021-09-26 LAB — URINE CULTURE: Culture: NO GROWTH

## 2021-09-26 LAB — COMPREHENSIVE METABOLIC PANEL
ALT: 22 U/L (ref 0–44)
AST: 78 U/L — ABNORMAL HIGH (ref 15–41)
Albumin: 2 g/dL — ABNORMAL LOW (ref 3.5–5.0)
Alkaline Phosphatase: 86 U/L (ref 38–126)
Anion gap: 10 (ref 5–15)
BUN: 8 mg/dL (ref 6–20)
CO2: 16 mmol/L — ABNORMAL LOW (ref 22–32)
Calcium: 8 mg/dL — ABNORMAL LOW (ref 8.9–10.3)
Chloride: 110 mmol/L (ref 98–111)
Creatinine, Ser: 1.34 mg/dL — ABNORMAL HIGH (ref 0.61–1.24)
GFR, Estimated: >60 mL/min (ref >60)
Glucose, Bld: 108 mg/dL — ABNORMAL HIGH (ref 70–99)
Potassium: 3 mmol/L — ABNORMAL LOW (ref 3.5–5.1)
Sodium: 136 mmol/L (ref 135–145)
Total Bilirubin: 0.6 mg/dL (ref 0.3–1.2)
Total Protein: 5.7 g/dL — ABNORMAL LOW (ref 6.5–8.1)

## 2021-09-26 LAB — PROTIME-INR
INR: 1.7 — ABNORMAL HIGH (ref 0.8–1.2)
Prothrombin Time: 20 seconds — ABNORMAL HIGH (ref 11.4–15.2)

## 2021-09-26 LAB — CBC
HCT: 31.4 % — ABNORMAL LOW (ref 39.0–52.0)
Hemoglobin: 10.4 g/dL — ABNORMAL LOW (ref 13.0–17.0)
MCH: 34.6 pg — ABNORMAL HIGH (ref 26.0–34.0)
MCHC: 33.1 g/dL (ref 30.0–36.0)
MCV: 104.3 fL — ABNORMAL HIGH (ref 80.0–100.0)
Platelets: 179 10*3/uL (ref 150–400)
RBC: 3.01 MIL/uL — ABNORMAL LOW (ref 4.22–5.81)
RDW: 13.7 % (ref 11.5–15.5)
WBC: 18.8 10*3/uL — ABNORMAL HIGH (ref 4.0–10.5)
nRBC: 0 % (ref 0.0–0.2)

## 2021-09-26 LAB — PHOSPHORUS: Phosphorus: 1.7 mg/dL — ABNORMAL LOW (ref 2.5–4.6)

## 2021-09-26 LAB — LACTIC ACID, PLASMA
Lactic Acid, Venous: 5 mmol/L (ref 0.5–1.9)
Lactic Acid, Venous: 3.8 mmol/L (ref 0.5–1.9)
Lactic Acid, Venous: 1.8 mmol/L (ref 0.5–1.9)

## 2021-09-26 LAB — MAGNESIUM: Magnesium: 1.5 mg/dL — ABNORMAL LOW (ref 1.7–2.4)

## 2021-09-26 LAB — HIV ANTIBODY (ROUTINE TESTING W REFLEX): HIV Screen 4th Generation wRfx: REACTIVE — AB

## 2021-09-26 LAB — APTT: aPTT: 32 seconds (ref 24–36)

## 2021-09-26 SURGERY — CYSTOSCOPY, WITH URETEROCELE INCISION OR EXCISION
Anesthesia: General

## 2021-09-26 MED ORDER — PROPOFOL 10 MG/ML IV BOLUS
INTRAVENOUS | Status: DC | PRN
  Administered 2021-09-26: 100 mg via INTRAVENOUS

## 2021-09-26 MED ORDER — IOHEXOL 300 MG/ML  SOLN
80.0000 mL | Freq: Once | INTRAMUSCULAR | Status: AC | PRN
  Administered 2021-09-26: 80 mL via INTRAVENOUS

## 2021-09-26 MED ORDER — LACTATED RINGERS IV SOLN: INTRAVENOUS | Status: DC

## 2021-09-26 MED ORDER — ONDANSETRON HCL 4 MG/2ML IJ SOLN
INTRAMUSCULAR | Status: DC | PRN
  Administered 2021-09-26: 4 mg via INTRAVENOUS

## 2021-09-26 MED ORDER — LACTATED RINGERS IV SOLN
INTRAVENOUS | Status: DC | PRN
Start: 2021-09-26 — End: 2021-09-26

## 2021-09-26 MED ORDER — CHLORHEXIDINE GLUCONATE 0.12 % MT SOLN: 15.0000 mL | Freq: Once | OROMUCOSAL | Status: AC

## 2021-09-26 MED ORDER — MIDAZOLAM HCL 5 MG/5ML IJ SOLN
INTRAMUSCULAR | Status: DC | PRN
  Administered 2021-09-26: 2 mg via INTRAVENOUS

## 2021-09-26 MED ORDER — PHENYLEPHRINE HCL (PRESSORS) 10 MG/ML IV SOLN
INTRAVENOUS | Status: DC | PRN
  Administered 2021-09-26 (×3): 80 ug via INTRAVENOUS
  Administered 2021-09-26: 120 ug via INTRAVENOUS

## 2021-09-26 MED ORDER — PROPOFOL 10 MG/ML IV BOLUS
INTRAVENOUS | Status: AC
  Filled 2021-09-26: qty 20

## 2021-09-26 MED ORDER — EPHEDRINE SULFATE-NACL 50-0.9 MG/10ML-% IV SOSY
PREFILLED_SYRINGE | INTRAVENOUS | Status: DC | PRN
  Administered 2021-09-26: 10 mg via INTRAVENOUS

## 2021-09-26 MED ORDER — LORAZEPAM 1 MG PO TABS: 1.0000 mg | ORAL_TABLET | ORAL | Status: DC | PRN

## 2021-09-26 MED ORDER — MAGNESIUM SULFATE IN D5W 1-5 GM/100ML-% IV SOLN
1.0000 g | Freq: Once | INTRAVENOUS | Status: AC
  Administered 2021-09-26: 1 g via INTRAVENOUS
  Filled 2021-09-26: qty 100

## 2021-09-26 MED ORDER — ADULT MULTIVITAMIN W/MINERALS CH
1.0000 | ORAL_TABLET | Freq: Every day | ORAL | Status: DC
  Administered 2021-09-27 – 2021-09-30 (×4): 1 via ORAL
  Filled 2021-09-26 (×4): qty 1

## 2021-09-26 MED ORDER — FENTANYL CITRATE (PF) 100 MCG/2ML IJ SOLN
INTRAMUSCULAR | Status: DC | PRN
  Administered 2021-09-26 (×5): 25 ug via INTRAVENOUS

## 2021-09-26 MED ORDER — OXYCODONE HCL 5 MG PO TABS: 5.0000 mg | ORAL_TABLET | Freq: Once | ORAL | Status: DC | PRN

## 2021-09-26 MED ORDER — MAGNESIUM SULFATE 2 GM/50ML IV SOLN: 2.0000 g | Freq: Once | INTRAVENOUS | Status: DC

## 2021-09-26 MED ORDER — LIDOCAINE HCL (CARDIAC) PF 100 MG/5ML IV SOSY
PREFILLED_SYRINGE | INTRAVENOUS | Status: DC | PRN
  Administered 2021-09-26: 60 mg via INTRAVENOUS

## 2021-09-26 MED ORDER — FENTANYL CITRATE (PF) 100 MCG/2ML IJ SOLN: 25.0000 ug | INTRAMUSCULAR | Status: DC | PRN

## 2021-09-26 MED ORDER — DEXMEDETOMIDINE (PRECEDEX) IN NS 20 MCG/5ML (4 MCG/ML) IV SYRINGE
PREFILLED_SYRINGE | INTRAVENOUS | Status: DC | PRN
  Administered 2021-09-26: 8 ug via INTRAVENOUS

## 2021-09-26 MED ORDER — SODIUM CHLORIDE 0.9 % IR SOLN
Status: DC | PRN
  Administered 2021-09-26 (×10): 3000 mL

## 2021-09-26 MED ORDER — ORAL CARE MOUTH RINSE: 15.0000 mL | Freq: Once | OROMUCOSAL | Status: AC

## 2021-09-26 MED ORDER — DEXAMETHASONE SODIUM PHOSPHATE 4 MG/ML IJ SOLN
INTRAMUSCULAR | Status: DC | PRN
Start: 2021-09-26 — End: 2021-09-26
  Administered 2021-09-26: 10 mg via INTRAVENOUS

## 2021-09-26 MED ORDER — ACETAMINOPHEN 325 MG PO TABS
650.0000 mg | ORAL_TABLET | Freq: Four times a day (QID) | ORAL | Status: DC | PRN
  Administered 2021-09-26: 650 mg via ORAL
  Filled 2021-09-26: qty 2

## 2021-09-26 MED ORDER — METRONIDAZOLE 500 MG/100ML IV SOLN
500.0000 mg | Freq: Three times a day (TID) | INTRAVENOUS | Status: DC
  Administered 2021-09-26 – 2021-09-27 (×3): 500 mg via INTRAVENOUS
  Filled 2021-09-26 (×3): qty 100

## 2021-09-26 MED ORDER — LORAZEPAM 2 MG/ML IJ SOLN: 1.0000 mg | INTRAMUSCULAR | Status: DC | PRN

## 2021-09-26 MED ORDER — CHLORHEXIDINE GLUCONATE 0.12 % MT SOLN
OROMUCOSAL | Status: AC
  Administered 2021-09-26: 15 mL via OROMUCOSAL
  Filled 2021-09-26: qty 15

## 2021-09-26 MED ORDER — SODIUM CHLORIDE 0.9 % IV SOLN: INTRAVENOUS | Status: DC | PRN

## 2021-09-26 MED ORDER — FENTANYL CITRATE (PF) 250 MCG/5ML IJ SOLN
INTRAMUSCULAR | Status: AC
  Filled 2021-09-26: qty 5

## 2021-09-26 MED ORDER — IOHEXOL 300 MG/ML  SOLN
INTRAMUSCULAR | Status: DC | PRN
  Administered 2021-09-26: 100 mL

## 2021-09-26 MED ORDER — LACTATED RINGERS IV BOLUS
1000.0000 mL | Freq: Once | INTRAVENOUS | Status: AC
  Administered 2021-09-26: 1000 mL via INTRAVENOUS

## 2021-09-26 MED ORDER — POTASSIUM CHLORIDE CRYS ER 20 MEQ PO TBCR
20.0000 meq | EXTENDED_RELEASE_TABLET | Freq: Once | ORAL | Status: AC
  Administered 2021-09-26: 20 meq via ORAL
  Filled 2021-09-26: qty 1

## 2021-09-26 MED ORDER — 0.9 % SODIUM CHLORIDE (POUR BTL) OPTIME
TOPICAL | Status: DC | PRN
  Administered 2021-09-26: 1000 mL

## 2021-09-26 MED ORDER — FOLIC ACID 1 MG PO TABS
1.0000 mg | ORAL_TABLET | Freq: Every day | ORAL | Status: DC
  Administered 2021-09-27 – 2021-09-30 (×4): 1 mg via ORAL
  Filled 2021-09-26 (×4): qty 1

## 2021-09-26 MED ORDER — POTASSIUM CHLORIDE 10 MEQ/100ML IV SOLN
10.0000 meq | INTRAVENOUS | Status: AC
Start: 2021-09-26 — End: 2021-09-26
  Administered 2021-09-26 (×3): 10 meq via INTRAVENOUS
  Filled 2021-09-26 (×3): qty 100

## 2021-09-26 MED ORDER — MIDAZOLAM HCL 2 MG/2ML IJ SOLN
INTRAMUSCULAR | Status: AC
  Filled 2021-09-26: qty 2

## 2021-09-26 MED ORDER — ENOXAPARIN SODIUM 40 MG/0.4ML IJ SOSY: 40.0000 mg | PREFILLED_SYRINGE | INTRAMUSCULAR | Status: DC

## 2021-09-26 MED ORDER — OXYCODONE HCL 5 MG/5ML PO SOLN: 5.0000 mg | Freq: Once | ORAL | Status: DC | PRN

## 2021-09-26 MED ORDER — ONDANSETRON HCL 4 MG/2ML IJ SOLN: 4.0000 mg | Freq: Four times a day (QID) | INTRAMUSCULAR | Status: DC | PRN

## 2021-09-26 MED ORDER — POTASSIUM PHOSPHATES 15 MMOLE/5ML IV SOLN
30.0000 mmol | Freq: Once | INTRAVENOUS | Status: AC
  Administered 2021-09-26: 30 mmol via INTRAVENOUS
  Filled 2021-09-26: qty 10

## 2021-09-26 MED ORDER — PHENYLEPHRINE HCL-NACL 20-0.9 MG/250ML-% IV SOLN
INTRAVENOUS | Status: DC | PRN
  Administered 2021-09-26: 50 ug/min via INTRAVENOUS

## 2021-09-26 MED ORDER — ACETAMINOPHEN 650 MG RE SUPP: 650.0000 mg | Freq: Four times a day (QID) | RECTAL | Status: DC | PRN

## 2021-09-26 SURGICAL SUPPLY — 25 items
BAG DRN RND TRDRP ANRFLXCHMBR (UROLOGICAL SUPPLIES) ×1
BAG URINE DRAIN 2000ML AR STRL (UROLOGICAL SUPPLIES) ×2 IMPLANT
BAG URO CATCHER STRL LF (MISCELLANEOUS) ×2 IMPLANT
CATH FOLEY 2WAY 5CC 16FR (CATHETERS) ×2
CATH FOLEY 3WAY 30CC 22FR (CATHETERS) ×1 IMPLANT
CATH INTERMIT  6FR 70CM (CATHETERS) IMPLANT
CATH URET 5FR 28IN OPEN ENDED (CATHETERS) IMPLANT
CATH URTH STD 16FR FL 2W DRN (CATHETERS) ×1 IMPLANT
GLOVE SURG ENC TEXT LTX SZ7 (GLOVE) ×2 IMPLANT
GOWN STRL REUS W/TWL LRG LVL3 (GOWN DISPOSABLE) ×2 IMPLANT
GUIDEWIRE ANG ZIPWIRE 038X150 (WIRE) IMPLANT
GUIDEWIRE STR DUAL SENSOR (WIRE) ×4 IMPLANT
IV CATH 14GX2 1/4 (CATHETERS) IMPLANT
IV NS 1000ML (IV SOLUTION) ×2
IV NS 1000ML BAXH (IV SOLUTION) ×1 IMPLANT
IV NS IRRIG 3000ML ARTHROMATIC (IV SOLUTION) ×10 IMPLANT
KIT TURNOVER KIT A (KITS) IMPLANT
LOOP CUT BIPOLAR 24F LRG (ELECTROSURGICAL) ×1 IMPLANT
MANIFOLD NEPTUNE II (INSTRUMENTS) ×3 IMPLANT
NS IRRIG 1000ML POUR BTL (IV SOLUTION) ×1 IMPLANT
PACK CYSTO (CUSTOM PROCEDURE TRAY) ×2 IMPLANT
STENT CONTOUR 6FRX24X.038 (STENTS) IMPLANT
STENT CONTOUR 6FRX26X.038 (STENTS) ×2 IMPLANT
TUBING CONNECTING 10 (TUBING) ×2 IMPLANT
WATER STERILE IRR 1000ML POUR (IV SOLUTION) ×1 IMPLANT

## 2021-09-26 NOTE — Op Note (Addendum)
Operative Note  Preoperative diagnosis:  1.  Presumed prostatic abscess vs urethral diverticulum  Postoperative diagnosis: 1.  No evidence of prostate abscess on exam or intraoperatively 2. BPH  Procedure(s): 1.  Cystoscopy 2. Exam under anesthesia 3. Bipolar transurethral resection of prostate  Surgeon: Jettie Pagan, MD  Assistants:  None  Anesthesia:  General  Complications:  None  EBL:  Minimal  Specimens: 1. Prostate chips ID Type Source Tests Collected by Time Destination  1 : Prostate Chips GU Prostate Chips SURGICAL PATHOLOGY Jannifer Hick, MD 09/26/2021 1714     Drains/Catheters: 1.  22Fr 3 way catheter with 20ml water into balloon  Intraoperative findings:   Cystourethroscopy demonstrated normal anterior urethra with some mild urethral sloughing in the bulbar urethra distal to the external urethral sphincter.  This could have been due to trauma from prior Foley catheter placement. There is no evidence of any urethral os into a urethral diverticulum and no evidence of any purulent material within the urethra.  He does have a trilobar moderately obstructing prostate with a normal-appearing verumontanum and an elevated intravesical component of a median lobe.  Systemic evaluation of the bladder demonstrated no suspicious lesions.  His bilateral ureteral orifices were in their normal orthotopic position. Transurethral resection of the median lobe and bilateral lateral lobes did not demonstrate any unroofing of abscess.  I carried this resection deeply and widely with inability to uncover abscess.  Excellent hemostasis was obtained. Exam under anesthesia demonstrated a normal-sized prostate with no evidence of fluctuant or indurated lesion.  Further, his perineum was normal on inspection with no evidence of erythema, necrosis, fluctuance or induration.  I cannot palpate any evidence of an abscess. Unfortunately, a rectal ultrasound probe was unavailable. I talked with staff  at [facility] and the ultrasonographer and was told that there is no rectal or vaginal probe in this hospital. I did ask someone to bring in a basic ultrasound however no appropriate probes available.  Indication:  [name] is a 60 y.o. male with history of UTI on 09/18/2021 we presented to the hospital today with complaints of frequency.  He was found to be febrile, tachycardic, hypotensive with a leukocytosis of 18.8.  Urinalysis demonstrated negative nitrates, large leukocyte Estrace, urine culture from 09/18/2021 demonstrated multiple species present.  He denied perineal pain.  He did have some anal discomfort.  I did review his imaging preoperatively that demonstrated a cystic fluid collection that appeared to be inferior in the base of the prostate.  I reviewed the case with general surgery preoperatively in case they need to be called for assistance.  I discussed with the patient the plan for transurethral unroofing the prostate, possible transrectal ultrasound with aspiration.  He consented and agreed to proceed.   Description of procedure: The indication, alternatives, benefits and risks were discussed with the patient and informed consent was obtained.  Patient was brought to the operating room table, positioned supine, secured with a safety strap.  Pneumatic compression devices were placed on the lower extremities.  After the administration of intravenous antibiotics and general anesthesia, the patient was repositioned into the dorsal lithotomy position.  All pressure points were carefully padded.  A rectal examination was performed demonstrating a right-sided hemorrhoid with edema of his anus, inability to palpate an abscess with no fluctuance or induration appreciated.  He had enlarged prostate approximately 45 g with no nodules.  A 20 French rigid cystoscope with passed into the urethra into the prostate.  I  did note some sloughing of his urethral tissue just distal to the  external urethral sphincter.  No purulence was identified.  He had a normal-appearing verumontanum.  He had bilobar obstructing prostate with an elevated intravesical protrusion of his median lobe.  I surveyed his bladder with no evidence of any suspicious lesions.  He had bilateral ureteral orifices in their normal orthotopic position.    I requested a rectal ultrasound however none was available.  The operating room staff so that there is no ultrasound probe available in the operating room.  He also cannot find a vaginal probe.  We called the ultrasonographer technician and they confirmed that there is no rectal ultrasound probe in this hospital.  A 26 French continuous-flow resectoscope sheath with the visual obturator and a 30 degree lens was advanced under direct vision into the bladder.  The obturator was removed and replaced by the working element with a resection loop.  The location of the ureteral orifices and the prostatic configuration were again confirmed.  Starting at the bladder neck and proceeding distally to the verumontanum a transurethral section of the prostate was performed using bipolar using energy of 4 and 5 for cutting and coagulation, respectively.  The procedure began at the bladder neck at the 5 o'clock and 7 o'clock positions and carefully carried distally to the verumontanum, resecting the intervening prostatic adenoma.  I resected the median lobe of the prostate first with no evidence of any purulent material to drain.   Next the left lateral lobe was resected deeply with inability to unroofing abscess.  Next, the right lateral lobe was resected deeply with inability to uncover an abscess.  All bleeding vessels were fulgurated achieving meticulous hemostasis.  The bladder was irrigated with a Toomey syringe, ensuring removal of all prostate chips which were sent to pathology for evaluation.  At this point, there is no rectal ultrasound probe available to perform transrectal  ultrasound and attempt to aspirate this area.  I repeated my exam and was unfortunately unable to locate any areas of fluctuance in the prostate or inferior to the prostate.  The perineum similarly had no evidence of any abscess.  I elected to terminate the case after leaving a 22 French three-way Foley catheter in place with a 30 cc balloon.  I will plan to consult with interventional radiology and see if they can aspirate this cystic abscess as it did appear to be closer to the rectum than the urethra.  At the end of the procedure, all counts were correct.  Patient tolerated the procedure well and was taken to the recovery room satisfactory condition.  Plan: Leave Foley catheter to gravity.  Manually irrigate as needed for decreased drainage or clots.  I will consult interventional radiology for transrectal guided aspiration of this abscess.  Matt R. Raymar Joiner MD [facility]  Pager: [phone]

## 2021-09-26 NOTE — Transfer of Care (Signed)
Immediate Anesthesia Transfer of Care Note  Patient: [name]  Procedure(s) Performed: CYSTOSCOPY TRANS URETERAL RESECTION PROSTATE EXAM UNDER ANESTHESIA  Patient Location: PACU  Anesthesia Type:General  Level of Consciousness: awake, alert  and oriented  Airway & Oxygen Therapy: Patient Spontanous Breathing  Post-op Assessment: Report given to RN and Post -op Vital signs reviewed and stable  Post vital signs: Reviewed and stable  Last Vitals:  Vitals Value Taken Time  BP    Temp    Pulse    Resp    SpO2      Last Pain:  Vitals:   09/26/21 1440  TempSrc: Oral  PainSc: 0-No pain         Complications: No notable events documented.

## 2021-09-26 NOTE — ED Notes (Signed)
This tech emptied the pts unrinal containing . Pt stated his pants had gotten wet from using the urinal. Had pt remove his pants and I placed them in a belongings bag with pt label and marked as soiled. Provided peri care to pt and placed brief on him with his permission. Changed bed linens and gown. Placed bedside commode in pts room, he is aware we need a stool sample if possible. Pt is clean and resting.

## 2021-09-26 NOTE — Anesthesia Preprocedure Evaluation (Signed)
Anesthesia Evaluation  Patient identified by MRN, date of birth, ID band Patient awake    Reviewed: Allergy & Precautions, H&P , NPO status , Patient's Chart, lab work & pertinent test results  Airway Mallampati: II   Neck ROM: full    Dental   Pulmonary neg pulmonary ROS,    breath sounds clear to auscultation       Cardiovascular +CHF   Rhythm:regular Rate:Normal     Neuro/Psych    GI/Hepatic   Endo/Other    Renal/GU Renal disease     Musculoskeletal   Abdominal   Peds  Hematology   Anesthesia Other Findings   Reproductive/Obstetrics                             Anesthesia Physical Anesthesia Plan  ASA: 3  Anesthesia Plan: General   Post-op Pain Management:    Induction: Intravenous  PONV Risk Score and Plan: 2 and Ondansetron, Dexamethasone, Midazolam and Treatment may vary due to age or medical condition  Airway Management Planned: LMA  Additional Equipment:   Intra-op Plan:   Post-operative Plan: Extubation in OR  Informed Consent: I have reviewed the patients History and Physical, chart, labs and discussed the procedure including the risks, benefits and alternatives for the proposed anesthesia with the patient or authorized representative who has indicated his/her understanding and acceptance.     Dental advisory given  Plan Discussed with: CRNA, Anesthesiologist and Surgeon  Anesthesia Plan Comments:         Anesthesia Quick Evaluation

## 2021-09-26 NOTE — Anesthesia Procedure Notes (Signed)
Procedure Name: LMA Insertion Date/Time: 09/26/2021 4:10 PM Performed by: Macie Burows, CRNA Pre-anesthesia Checklist: Patient identified, Emergency Drugs available, Suction available, Patient being monitored and Timeout performed Patient Re-evaluated:Patient Re-evaluated prior to induction Oxygen Delivery Method: Circle system utilized Preoxygenation: Pre-oxygenation with 100% oxygen Induction Type: IV induction LMA: LMA inserted LMA Size: 5.0 Placement Confirmation: positive ETCO2 and breath sounds checked- equal and bilateral Tube secured with: Tape Dental Injury: Teeth and Oropharynx as per pre-operative assessment

## 2021-09-26 NOTE — H&P (Signed)
History and Physical    [name] JHE:[id number] DOB: 08-31-1961 DOA: 09/25/2021  PCP: Pcp, No Patient coming from: Home  Chief Complaint: Urinary frequency, diarrhea  HPI: [name] is a 60 y.o. male with medical history significant of alcohol abuse, CVAs, tickborne illness in 2018.  Seen in the ED on 09/18/2021 for urinary frequency.  He was diagnosed with UTI and prescribed a 7-day course of Keflex.  Urine culture grew multiple organisms.  He presents to the ED today complaining of urinary frequency and diarrhea.  Concern for alcohol withdrawal as he normally drinks several cans of beer daily but stopped drinking a week ago after he was started on the antibiotic.  Hypotensive and tachycardic with EMS.  He was given IV fluids prior to arrival.  In the ED, patient was febrile with temperature 101.9 F, tachycardic with heart rate in the 150s, and hypotensive with systolic blood pressure in the 80s.  Labs showing WBC 10.8 with left shift, hemoglobin 11.7 (stable), platelet count 219k.  Sodium 135, potassium 3.3, chloride 106, bicarb 17, anion gap 12, BUN 8, creatinine 1.4 (baseline 1.0), glucose 126.  Total protein 6.4.  Calcium 8.1, albumin 2.3.  AST mildly elevated at 49, remainder of LFTs normal.  Lipase normal.  PT 18.0, INR 1.5.  aPTT 29.  UA with large amount of leukocytes, greater than 50 WBCs, and few bacteria.  Urine culture pending.  Initial lactate 6.9 and repeat after 2 L fluid boluses was 7.0.  Blood culture x2 pending.  C. difficile PCR and GI pathogen panel pending.  COVID and influenza PCR negative.  Chest x-ray showing no active disease.   Patient is a poor historian.  States "I have been pooping and shitting a lot."  Symptoms started a week ago.  Also endorsing urinary frequency.  States he was prescribed a "big pill" which she has been taking 4 times a day.  Denies any pain with urination.  Denies any pain in his genitals or pelvic region.  Denies abdominal pain, back pain,  or flank pain.  Denies nausea or vomiting.  Denies cough, shortness of breath, or chest pain.  No additional history could be obtained from him.  Review of Systems:  All systems reviewed and apart from history of presenting illness, are negative.  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    Past Surgical History:  Procedure Laterality Date   HERNIA REPAIR       reports that he has never smoked. He has never used smokeless tobacco. He reports current alcohol use of about 7.0 standard drinks per week. He reports that he does not use drugs.  No Known Allergies  Family History  Problem Relation Age of Onset   Cirrhosis Neg Hx    Liver disease Neg Hx    Cancer Neg Hx     Prior to Admission medications   Medication Sig Start Date End Date Taking? Authorizing Provider  folic acid (FOLVITE) 1 MG tablet Take 1 tablet (1 mg total) by mouth daily. Patient not taking: No sig reported 02/14/20   Joseph Art, DO  Multiple Vitamin (MULTIVITAMIN WITH MINERALS) TABS tablet Take 1 tablet by mouth daily. Patient not taking: No sig reported 02/14/20   Joseph Art, DO  naproxen (NAPROSYN) 500 MG tablet Take 1 tablet (500 mg total) by mouth 2 (two) times daily with a meal. Patient not taking: Reported on 09/25/2021 05/18/21   Caccavale,  Sophia, PA-C  ondansetron (ZOFRAN ODT) 4 MG disintegrating tablet Take 1 tablet (4 mg total) by mouth every 8 (eight) hours as needed for nausea or vomiting. Patient not taking: Reported on 09/25/2021 11/30/20   Sabas Sous, MD  thiamine 100 MG tablet Take 1 tablet (100 mg total) by mouth daily. Patient not taking: No sig reported 02/13/20   Joseph Art, DO    Physical Exam: Vitals:   09/25/21 2300 09/25/21 2315 09/25/21 2330 09/26/21 0000  BP:  97/69 90/71 90/72   Pulse: (!) 111 (!) 111 (!) 109 (!) 106  Resp: (!) 25 (!) 27 (!) 26 (!) 24  Temp:      TempSrc:      SpO2: 98% 97% 99% 98%    Physical  Exam Constitutional:      General: He is not in acute distress. HENT:     Head: Normocephalic and atraumatic.  Eyes:     Extraocular Movements: Extraocular movements intact.     Conjunctiva/sclera: Conjunctivae normal.  Cardiovascular:     Rate and Rhythm: Regular rhythm. Tachycardia present.     Pulses: Normal pulses.  Pulmonary:     Effort: No respiratory distress.     Breath sounds: Normal breath sounds. No wheezing or rales.     Comments: Slightly tachypneic Abdominal:     General: Bowel sounds are normal. There is no distension.     Palpations: Abdomen is soft.     Tenderness: There is no abdominal tenderness. There is no guarding or rebound.  Musculoskeletal:        General: No swelling or tenderness.     Cervical back: Normal range of motion and neck supple.  Skin:    General: Skin is warm and dry.  Neurological:     General: No focal deficit present.     Mental Status: He is alert and oriented to person, place, and time.     Labs on Admission: I have personally reviewed following labs and imaging studies  CBC: Recent Labs  Lab 09/25/21 1906  WBC 10.8*  NEUTROABS 9.9*  HGB 11.7*  HCT 35.1*  MCV 103.8*  PLT 219   Basic Metabolic Panel: Recent Labs  Lab 09/25/21 1906  NA 135  K 3.3*  CL 106  CO2 17*  GLUCOSE 126*  BUN 8  CREATININE 1.44*  CALCIUM 8.1*   GFR: Estimated Creatinine Clearance: 49.6 mL/min (A) (by C-G formula based on SCr of 1.44 mg/dL (H)). Liver Function Tests: Recent Labs  Lab 09/25/21 1906  AST 49*  ALT 19  ALKPHOS 103  BILITOT 0.7  PROT 6.4*  ALBUMIN 2.3*   Recent Labs  Lab 09/25/21 1906  LIPASE 17   No results for input(s): AMMONIA in the last 168 hours. Coagulation Profile: Recent Labs  Lab 09/25/21 2033  INR 1.5*   Cardiac Enzymes: No results for input(s): CKTOTAL, CKMB, CKMBINDEX, TROPONINI in the last 168 hours. BNP (last 3 results) No results for input(s): PROBNP in the last 8760 hours. HbA1C: No results  for input(s): HGBA1C in the last 72 hours. CBG: No results for input(s): GLUCAP in the last 168 hours. Lipid Profile: No results for input(s): CHOL, HDL, LDLCALC, TRIG, CHOLHDL, LDLDIRECT in the last 72 hours. Thyroid Function Tests: No results for input(s): TSH, T4TOTAL, FREET4, T3FREE, THYROIDAB in the last 72 hours. Anemia Panel: No results for input(s): VITAMINB12, FOLATE, FERRITIN, TIBC, IRON, RETICCTPCT in the last 72 hours. Urine analysis:    Component Value Date/Time   COLORURINE  YELLOW 09/25/2021 1906   APPEARANCEUR HAZY (A) 09/25/2021 1906   LABSPEC 1.004 (L) 09/25/2021 1906   PHURINE 6.0 09/25/2021 1906   GLUCOSEU NEGATIVE 09/25/2021 1906   HGBUR MODERATE (A) 09/25/2021 1906   BILIRUBINUR NEGATIVE 09/25/2021 1906   KETONESUR NEGATIVE 09/25/2021 1906   PROTEINUR NEGATIVE 09/25/2021 1906   UROBILINOGEN 1.0 03/30/2011 0942   NITRITE NEGATIVE 09/25/2021 1906   LEUKOCYTESUR LARGE (A) 09/25/2021 1906    Radiological Exams on Admission: CT ABDOMEN PELVIS W CONTRAST  Result Date: 09/26/2021 CLINICAL DATA:  Abdominal pain. EXAM: CT ABDOMEN AND PELVIS WITH CONTRAST TECHNIQUE: Multidetector CT imaging of the abdomen and pelvis was performed using the standard protocol following bolus administration of intravenous contrast. CONTRAST:  69mL OMNIPAQUE IOHEXOL 300 MG/ML  SOLN COMPARISON:  CT abdomen pelvis dated 03/30/2011. FINDINGS: Lower chest: The visualized lung bases are clear. There is coronary vascular calcification. No intra-abdominal free air or free fluid. Hepatobiliary: Diffuse fatty liver. There is diffuse thickening of the gallbladder wall, likely related to liver dysfunction. No calcified gallstone. Similar findings were seen on the prior CT of 2012. Pancreas: The pancreas is atrophic, sequela of chronic pancreas. There is however diffuse stranding and edema of the upper mesentery in the region of the pancreas concerning for acute pancreatitis. Correlation with pancreatic  enzymes recommended. No drainable fluid collection or abscess. Spleen: Calcification of the splenic capsule. Adrenals/Urinary Tract: The adrenal glands unremarkable. There is a linear 4 mm nonobstructing right renal upper pole calculus. No hydronephrosis. The left kidney is unremarkable. There is symmetric enhancement and excretion of contrast by both kidneys. The visualized ureters and urinary bladder appear unremarkable. Stomach/Bowel: There is apparent mild rectal prolapse. There is no bowel obstruction or active inflammation. The appendix is normal. Vascular/Lymphatic: Mild aortoiliac atherosclerotic disease. The IVC is unremarkable. No portal venous gas. There is no adenopathy. Reproductive: The prostate gland is mildly enlarged with median lobe hypertrophy. There is a 3.8 x 3.5 cm cystic structure at the base of the penis and inferior to the prostate gland. This may represent a urethral diverticulum. However, a prostatic abscess is not excluded clinical correlation is recommended. Other: None Musculoskeletal: No acute or significant osseous findings. IMPRESSION: 1. Possible acute pancreatitis. No drainable fluid collection or abscess. 2. Fatty liver. 3. Atrophic pancreas sequela of chronic pancreas. 4. A 4 mm nonobstructing right renal upper pole calculus. No hydronephrosis. 5. 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 Clinical correlation is recommended. 6. Aortic Atherosclerosis (ICD10-I70.0). Electronically Signed   By: Elgie Collard M.D.   On: 09/26/2021 01:01   US RENAL  Result Date: 09/26/2021 CLINICAL DATA:  Acute renal injury. EXAM: RENAL / URINARY TRACT ULTRASOUND COMPLETE COMPARISON:  None. FINDINGS: Right Kidney: Renal measurements: 10.1 cm x 4.8 cm x 4.5 cm = volume: 114.34 mL. Diffusely increased echogenicity of the renal parenchyma is noted. No mass or hydronephrosis visualized. Left Kidney: Renal measurements: 9.8 cm x 5.0 cm x 4.8 cm = volume: 122.66 mL. Diffusely  increased echogenicity of the renal parenchyma is noted. No mass or hydronephrosis visualized. Bladder: Mild amount of debris is seen within the dependent portion of the urinary bladder Other: A trace amount of perinephric fluid is seen on the right. IMPRESSION: 1. Increased renal echogenicity which may be secondary to medical renal disease. 2. Mild amount of debris within the urinary bladder. 3. Some out of right-sided perinephric fluid. Electronically Signed   By: Aram Candela M.D.   On: 09/26/2021 01:17   DG  Chest Port 1 View  Result Date: 09/25/2021 CLINICAL DATA:  Questionable sepsis. EXAM: PORTABLE CHEST 1 VIEW COMPARISON:  Chest radiograph dated 04/04/2017. FINDINGS: No focal consolidation, pleural effusion, or pneumothorax. The cardiac silhouette is within normal limits. No acute osseous pathology. IMPRESSION: No active disease. Electronically Signed   By: Elgie Collard M.D.   On: 09/25/2021 20:12    EKG: Independently reviewed. Sinus tachycardia, ST/T wave abnormality likely rate related. Repeat EKG ordered and currently pending.   Assessment/Plan Principal Problem:   Septic shock (HCC) Active Problems:   Diarrhea   AKI (acute kidney injury) (HCC)   Complicated UTI (urinary tract infection)   Hypokalemia   Septic shock secondary to complicated UTI/possible prostate abscess Febrile, tachycardic, and hypotensive. WBC count borderline elevated 10.8 with left shift.  Initial lactate 6.9 and repeat after 2 L fluid boluses was 7.0. UA with large amount of leukocytes, greater than 50 WBCs, and few bacteria.  Suspect resistant organism as patient failed outpatient antibiotic treatment with Keflex. CT abdomen/ pelvis showing findings consistent with possible acute on chronic pancreatitis; no drainable fluid collection or abscess.  A 4 mm nonobstructing right renal upper pole calculus; no hydronephrosis.  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 Renal US negative for  stones or hydronephrosis; showing trace amount of right sided peripheric fluid.  Acute pancreatitis felt to be less likely given normal lipase and patient is not complaining of any abdominal or back pain.  Abdominal exam benign.  Tachycardia improved but BP still low with systolic in the 90s after 3 L IV fluids. However, maintaining MAP >65. PCCM felt that elevated lactate was due to decreased clearance from liver disease. Recommending admission to progressive care unit.  -Continue broad spectrum antibiotics including vancomycin, cefepime, and Flagyl at this time. Continue IV fluid resuscitation. Tylenol as needed for fevers. Urine and blood cultures pending. Trend lactate and WBC count.  Consult urology in the morning.  Keep n.p.o.  Diarrhea CT negative for colitis. -C diff PCR and GU pathogen panel pending, enteric precautions   Mild hypokalemia Likely due to diarrhea. -Monitor K and Mag levels, replenish as needed.  NAGMA Likely due to diarrhea. -IV fluid hydration, continue to monitor  AKI Likely prerenal from dehydration/ hypotension. Cr 1.4, baseline 1.0.  -IV fluid hydration. Monitor renal function and urine output. Avoid nephrotoxic agents.   Alcohol abuse Concern for withdrawal as patient stopped consuming alcoholic beverages a week ago. -CIWA protocol; Ativan as needed. Thiamine, folate, and multivitamin. Check Mag and Phos levels.  Mild transaminase elevation/ coagulopathy Likely due to liver disease from chronic alcohol abuse. CT showing fatty liver. -Continue to monitor   DVT prophylaxis: SCDs Code Status: Full code Family Communication: No family available at this time. Disposition Plan: Status is: Inpatient  Remains inpatient appropriate because: Septic shock  Level of care: Level of care: Progressive  The medical decision making on this patient was of high complexity and the patient is at high risk for clinical deterioration, therefore this is a level 3  visit.  John Giovanni MD [facility]  If 7PM-7AM, please contact night-coverage [email or web address]  09/26/2021, 1:49 AM

## 2021-09-26 NOTE — Consult Note (Addendum)
Urology Consult   Physician requesting consult: Jeoffrey Massed, MD  Reason for consult: Possible prostate abscess  History of Present Illness: [name] is a 60 y.o. with a PMH of EtOH use who initially presented to the ED on 09/18/2021 with urinary frequency.  Urine culture 09/18/2021 resulted in multiple species.  He was treated for presumed UTI and prescribed a 7-day course of Keflex.  He represented to the ED on 09/25/2021 febrile with temperature 101.9, tachycardic in the 150s, hypotensive and leukocytosis of 10.8.  He stated that he is having urinary frequency, urgency, diarrhea for several days.  He denies any dysuria. His creatinine was 1.4 from baseline of 1.0.  CT A/P 09/26/2021 demonstrated a 3.8 x 3.5 cm urethral diverticulum versus prostate abscess.  Upon my review of the imaging, this is very distal and likely distal to the external urethral sphincter abutting the rectum.  He also has a distended bladder.  There is a 4 mm nonobstructive right renal stone with no hydronephrosis.  He denies prior urological history.  He denies a history of UTIs, STDs, urolithiasis.  He has never seen urology physician prior.  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   Current Outpatient Medications on File Prior to Encounter  Medication Sig Dispense Refill   folic acid (FOLVITE) 1 MG tablet Take 1 tablet (1 mg total) by mouth daily. (Patient not taking: No sig reported) 30 tablet 0   Multiple Vitamin (MULTIVITAMIN WITH MINERALS) TABS tablet Take 1 tablet by mouth daily. (Patient not taking: No sig reported)     naproxen (NAPROSYN) 500 MG tablet Take 1 tablet (500 mg total) by mouth 2 (two) times daily with a meal. (Patient not taking: Reported on 09/25/2021) 14 tablet 0    ondansetron (ZOFRAN ODT) 4 MG disintegrating tablet Take 1 tablet (4 mg total) by mouth every 8 (eight) hours as needed for nausea or vomiting. (Patient not taking: Reported on 09/25/2021) 20 tablet 0   thiamine 100 MG tablet Take 1 tablet (100 mg total) by mouth daily. (Patient not taking: No sig reported)       Scheduled Meds:  folic acid  1 mg Oral Daily   multivitamin with minerals  1 tablet Oral Daily   thiamine injection  100 mg Intravenous Daily   Continuous Infusions:  ceFEPime (MAXIPIME) IV Stopped (09/26/21 0908)   lactated ringers Stopped (09/26/21 1234)   metronidazole Stopped (09/26/21 0449)   potassium PHOSPHATE IVPB (in mmol)     vancomycin     PRN Meds:.acetaminophen **OR** acetaminophen, LORazepam **OR** LORazepam  Allergies: No Known Allergies  Family History  Problem Relation Age of Onset   Cirrhosis Neg Hx    Liver disease Neg Hx    Cancer Neg Hx     Social History:  reports that he has never smoked. He has never used smokeless tobacco. He reports current alcohol use of about 7.0 standard drinks per week. He reports that he does not use drugs.  ROS: A complete review of systems was performed.  All systems are negative except for pertinent findings as noted.  Physical Exam:  Vital signs in last 24 hours: Temp:  [98.6 F (37 C)-101.9 F (38.8 C)] 98.6 F (37 C) (10/27 1000) Pulse Rate:  [  98-150] 100 (10/27 1120) Resp:  [19-34] 25 (10/27 1120) BP: (82-135)/(63-91) 91/72 (10/27 1120) SpO2:  [96 %-100 %] 98 % (10/27 1120) Constitutional:  Alert and oriented, No acute distress Cardiovascular: Regular rate and rhythm Respiratory: Normal respiratory effort, Lungs clear bilaterally GI: Abdomen is soft, nontender, nondistended, no abdominal masses Genitourinary: No CVAT. Normal male phallus, testes are descended bilaterally and non-tender and without masses, scrotum is normal in appearance without lesions or masses, perineum is normal on inspection. Rectal:  Exam is limited as he is sitting in diarrhea. Rectum prolapsed, that is tender to palpation. Unable to palpate prostate given patient discomfort.  Neurologic: Grossly intact, no focal deficits Psychiatric: Normal mood and affect  Laboratory Data:  Recent Labs    09/25/21 1906 09/26/21 0235  WBC 10.8* 18.8*  HGB 11.7* 10.4*  HCT 35.1* 31.4*  PLT 219 179    Recent Labs    09/25/21 1906 09/26/21 0235  NA 135 136  K 3.3* 3.0*  CL 106 110  GLUCOSE 126* 108*  BUN 8 8  CALCIUM 8.1* 8.0*  CREATININE 1.44* 1.34*     Results for orders placed or performed during the hospital encounter of 09/25/21 (from the past 24 hour(s))  CBC with Differential     Status: Abnormal   Collection Time: 09/25/21  7:06 PM  Result Value Ref Range   WBC 10.8 (H) 4.0 - 10.5 K/uL   RBC 3.38 (L) 4.22 - 5.81 MIL/uL   Hemoglobin 11.7 (L) 13.0 - 17.0 g/dL   HCT 12.8 (L) 11.8 - 86.7 %   MCV 103.8 (H) 80.0 - 100.0 fL   MCH 34.6 (H) 26.0 - 34.0 pg   MCHC 33.3 30.0 - 36.0 g/dL   RDW 73.7 36.6 - 81.5 %   Platelets 219 150 - 400 K/uL   nRBC 0.0 0.0 - 0.2 %   Neutrophils Relative % 93 %   Neutro Abs 9.9 (H) 1.7 - 7.7 K/uL   Lymphocytes Relative 3 %   Lymphs Abs 0.4 (L) 0.7 - 4.0 K/uL   Monocytes Relative 3 %   Monocytes Absolute 0.4 0.1 - 1.0 K/uL   Eosinophils Relative 0 %   Eosinophils Absolute 0.0 0.0 - 0.5 K/uL   Basophils Relative 0 %   Basophils Absolute 0.0 0.0 - 0.1 K/uL   Immature Granulocytes 1 %   Abs Immature Granulocytes 0.10 (H) 0.00 - 0.07 K/uL  Comprehensive metabolic panel     Status: Abnormal   Collection Time: 09/25/21  7:06 PM  Result Value Ref Range   Sodium 135 135 - 145 mmol/L   Potassium 3.3 (L) 3.5 - 5.1 mmol/L   Chloride 106 98 - 111 mmol/L   CO2 17 (L) 22 - 32 mmol/L   Glucose, Bld 126 (H) 70 - 99 mg/dL   BUN 8 6 - 20 mg/dL   Creatinine, Ser 9.47 (H) 0.61 - 1.24 mg/dL   Calcium 8.1 (L) 8.9 - 10.3 mg/dL   Total Protein 6.4 (L) 6.5 - 8.1 g/dL   Albumin 2.3 (L) 3.5 - 5.0  g/dL   AST 49 (H) 15 - 41 U/L   ALT 19 0 - 44 U/L   Alkaline Phosphatase 103 38 - 126 U/L   Total Bilirubin 0.7 0.3 - 1.2 mg/dL   GFR, Estimated 56 (L) >60 mL/min   Anion gap 12 5 - 15  Lipase, blood     Status: None   Collection Time: 09/25/21  7:06 PM  Result Value Ref Range  Lipase 17 11 - 51 U/L  Urinalysis, Routine w reflex microscopic Urine, Clean Catch     Status: Abnormal   Collection Time: 09/25/21  7:06 PM  Result Value Ref Range   Color, Urine YELLOW YELLOW   APPearance HAZY (A) CLEAR   Specific Gravity, Urine 1.004 (L) 1.005 - 1.030   pH 6.0 5.0 - 8.0   Glucose, UA NEGATIVE NEGATIVE mg/dL   Hgb urine dipstick MODERATE (A) NEGATIVE   Bilirubin Urine NEGATIVE NEGATIVE   Ketones, ur NEGATIVE NEGATIVE mg/dL   Protein, ur NEGATIVE NEGATIVE mg/dL   Nitrite NEGATIVE NEGATIVE   Leukocytes,Ua LARGE (A) NEGATIVE   RBC / HPF 6-10 0 - 5 RBC/hpf   WBC, UA >50 (H) 0 - 5 WBC/hpf   Bacteria, UA FEW (A) NONE SEEN   Squamous Epithelial / LPF 0-5 0 - 5   Mucus PRESENT   Lactic acid, plasma     Status: Abnormal   Collection Time: 09/25/21  7:06 PM  Result Value Ref Range   Lactic Acid, Venous 6.9 (HH) 0.5 - 1.9 mmol/L  Resp Panel by RT-PCR (Flu A&B, Covid) Nasopharyngeal Swab     Status: None   Collection Time: 09/25/21  7:07 PM   Specimen: Nasopharyngeal Swab; Nasopharyngeal(NP) swabs in vial transport medium  Result Value Ref Range   SARS Coronavirus 2 by RT PCR NEGATIVE NEGATIVE   Influenza A by PCR NEGATIVE NEGATIVE   Influenza B by PCR NEGATIVE NEGATIVE  Protime-INR     Status: Abnormal   Collection Time: 09/25/21  8:33 PM  Result Value Ref Range   Prothrombin Time 18.0 (H) 11.4 - 15.2 seconds   INR 1.5 (H) 0.8 - 1.2  APTT     Status: None   Collection Time: 09/25/21  8:33 PM  Result Value Ref Range   aPTT 29 24 - 36 seconds  Blood Culture (routine x 2)     Status: None (Preliminary result)   Collection Time: 09/25/21  8:33 PM   Specimen: BLOOD  Result Value Ref  Range   Specimen Description BLOOD LEFT ANTECUBITAL    Special Requests      BOTTLES DRAWN AEROBIC AND ANAEROBIC Blood Culture adequate volume   Culture      NO GROWTH < 12 HOURS Performed at [facility], [address], [place], [place] [address]    Report Status PENDING   Blood Culture (routine x 2)     Status: None (Preliminary result)   Collection Time: 09/25/21  8:33 PM   Specimen: BLOOD  Result Value Ref Range   Specimen Description BLOOD RIGHT ANTECUBITAL    Special Requests      BOTTLES DRAWN AEROBIC AND ANAEROBIC Blood Culture adequate volume   Culture      NO GROWTH < 12 HOURS Performed at [facility], [address] [place], [place] [address]    Report Status PENDING   Lactic acid, plasma     Status: Abnormal   Collection Time: 09/25/21  9:45 PM  Result Value Ref Range   Lactic Acid, Venous 7.0 (HH) 0.5 - 1.9 mmol/L  Lactic acid, plasma     Status: Abnormal   Collection Time: 09/26/21  2:35 AM  Result Value Ref Range   Lactic Acid, Venous 5.0 (HH) 0.5 - 1.9 mmol/L  Comprehensive metabolic panel     Status: Abnormal   Collection Time: 09/26/21  2:35 AM  Result Value Ref Range   Sodium 136 135 - 145 mmol/L  Potassium 3.0 (L) 3.5 - 5.1 mmol/L   Chloride 110 98 - 111 mmol/L   CO2 16 (L) 22 - 32 mmol/L   Glucose, Bld 108 (H) 70 - 99 mg/dL   BUN 8 6 - 20 mg/dL   Creatinine, Ser 5.28 (H) 0.61 - 1.24 mg/dL   Calcium 8.0 (L) 8.9 - 10.3 mg/dL   Total Protein 5.7 (L) 6.5 - 8.1 g/dL   Albumin 2.0 (L) 3.5 - 5.0 g/dL   AST 78 (H) 15 - 41 U/L   ALT 22 0 - 44 U/L   Alkaline Phosphatase 86 38 - 126 U/L   Total Bilirubin 0.6 0.3 - 1.2 mg/dL   GFR, Estimated >41 >32 mL/min   Anion gap 10 5 - 15  CBC     Status: Abnormal   Collection Time: 09/26/21  2:35 AM  Result Value Ref Range   WBC 18.8 (H) 4.0 - 10.5 K/uL   RBC 3.01 (L) 4.22 - 5.81 MIL/uL   Hemoglobin 10.4 (L) 13.0 - 17.0 g/dL   HCT 44.0 (L) 10.2 - 72.5 %   MCV 104.3 (H) 80.0 - 100.0 fL   MCH 34.6  (H) 26.0 - 34.0 pg   MCHC 33.1 30.0 - 36.0 g/dL   RDW 36.6 44.0 - 34.7 %   Platelets 179 150 - 400 K/uL   nRBC 0.0 0.0 - 0.2 %  Magnesium     Status: Abnormal   Collection Time: 09/26/21  2:35 AM  Result Value Ref Range   Magnesium 1.5 (L) 1.7 - 2.4 mg/dL  Phosphorus     Status: Abnormal   Collection Time: 09/26/21  2:35 AM  Result Value Ref Range   Phosphorus 1.7 (L) 2.5 - 4.6 mg/dL  APTT     Status: None   Collection Time: 09/26/21  2:35 AM  Result Value Ref Range   aPTT 32 24 - 36 seconds  Protime-INR     Status: Abnormal   Collection Time: 09/26/21  2:35 AM  Result Value Ref Range   Prothrombin Time 20.0 (H) 11.4 - 15.2 seconds   INR 1.7 (H) 0.8 - 1.2  Lactic acid, plasma     Status: Abnormal   Collection Time: 09/26/21  4:52 AM  Result Value Ref Range   Lactic Acid, Venous 3.8 (HH) 0.5 - 1.9 mmol/L   Recent Results (from the past 240 hour(s))  Urine Culture     Status: Abnormal   Collection Time: 09/18/21  7:18 AM   Specimen: Urine, Clean Catch  Result Value Ref Range Status   Specimen Description URINE, CLEAN CATCH  Final   Special Requests   Final    Normal Performed at [facility], [address], [place], [place] [address]    Culture MULTIPLE SPECIES PRESENT, SUGGEST RECOLLECTION (A)  Final   Report Status 09/19/2021 FINAL  Final  Resp Panel by RT-PCR (Flu A&B, Covid) Nasopharyngeal Swab     Status: None   Collection Time: 09/25/21  7:07 PM   Specimen: Nasopharyngeal Swab; Nasopharyngeal(NP) swabs in vial transport medium  Result Value Ref Range Status   SARS Coronavirus 2 by RT PCR NEGATIVE NEGATIVE Final    Comment: (NOTE) SARS-CoV-2 target nucleic acids are NOT DETECTED.  The SARS-CoV-2 RNA is generally detectable in upper respiratory specimens during the acute phase of infection. The lowest concentration of SARS-CoV-2 viral copies this assay can detect is 138 copies/mL. A negative result does not preclude SARS-Cov-2 infection and should not be  used  as the sole basis for treatment or other patient management decisions. A negative result may occur with  improper specimen collection/handling, submission of specimen other than nasopharyngeal swab, presence of viral mutation(s) within the areas targeted by this assay, and inadequate number of viral copies(<138 copies/mL). A negative result must be combined with clinical observations, patient history, and epidemiological information. The expected result is Negative.  Fact Sheet for Patients:  BloggerCourse.com  Fact Sheet for Healthcare Providers:  SeriousBroker.it  This test is no t yet approved or cleared by the [place] FDA and  has been authorized for detection and/or diagnosis of SARS-CoV-2 by FDA under an Emergency Use Authorization (EUA). This EUA will remain  in effect (meaning this test can be used) for the duration of the COVID-19 declaration under Section 564(b)(1) of the Act, 21 U.S.C.section 360bbb-3(b)(1), unless the authorization is terminated  or revoked sooner.       Influenza A by PCR NEGATIVE NEGATIVE Final   Influenza B by PCR NEGATIVE NEGATIVE Final    Comment: (NOTE) The Xpert Xpress SARS-CoV-2/FLU/RSV plus assay is intended as an aid in the diagnosis of influenza from Nasopharyngeal swab specimens and should not be used as a sole basis for treatment. Nasal washings and aspirates are unacceptable for Xpert Xpress SARS-CoV-2/FLU/RSV testing.  Fact Sheet for Patients: BloggerCourse.com  Fact Sheet for Healthcare Providers: SeriousBroker.it  This test is not yet approved or cleared by the [place] FDA and has been authorized for detection and/or diagnosis of SARS-CoV-2 by FDA under an Emergency Use Authorization (EUA). This EUA will remain in effect (meaning this test can be used) for the duration of the COVID-19 declaration under Section  564(b)(1) of the Act, 21 U.S.C. section 360bbb-3(b)(1), unless the authorization is terminated or revoked.  Performed at [facility], [address] [place], [place] [address]   Blood Culture (routine x 2)     Status: None (Preliminary result)   Collection Time: 09/25/21  8:33 PM   Specimen: BLOOD  Result Value Ref Range Status   Specimen Description BLOOD LEFT ANTECUBITAL  Final   Special Requests   Final    BOTTLES DRAWN AEROBIC AND ANAEROBIC Blood Culture adequate volume   Culture   Final    NO GROWTH < 12 HOURS Performed at [facility], [address], [place], [place] [address]    Report Status PENDING  Incomplete  Blood Culture (routine x 2)     Status: None (Preliminary result)   Collection Time: 09/25/21  8:33 PM   Specimen: BLOOD  Result Value Ref Range Status   Specimen Description BLOOD RIGHT ANTECUBITAL  Final   Special Requests   Final    BOTTLES DRAWN AEROBIC AND ANAEROBIC Blood Culture adequate volume   Culture   Final    NO GROWTH < 12 HOURS Performed at [facility], [address] [place], [place] [address]    Report Status PENDING  Incomplete    Renal Function: Recent Labs    09/25/21 1906 09/26/21 0235  CREATININE 1.44* 1.34*   Estimated Creatinine Clearance: 53.3 mL/min (A) (by C-G formula based on SCr of 1.34 mg/dL (H)).  Radiologic Imaging: CT ABDOMEN PELVIS W CONTRAST  Result Date: 09/26/2021 CLINICAL DATA:  Abdominal pain. EXAM: CT ABDOMEN AND PELVIS WITH CONTRAST TECHNIQUE: Multidetector CT imaging of the abdomen and pelvis was performed using the standard protocol following bolus administration of intravenous contrast. CONTRAST:  58mL OMNIPAQUE IOHEXOL 300 MG/ML  SOLN COMPARISON:  CT abdomen pelvis dated 03/30/2011. FINDINGS: Lower chest: The visualized lung bases are clear. There is coronary vascular calcification. No intra-abdominal free air or free fluid. Hepatobiliary: Diffuse fatty liver. There is diffuse thickening  of the gallbladder wall, likely related to liver dysfunction. No calcified gallstone. Similar findings were seen on the prior CT of 2012. Pancreas: The pancreas is atrophic, sequela of chronic pancreas. There is however diffuse stranding and edema of the upper mesentery in the region of the pancreas concerning for acute pancreatitis. Correlation with pancreatic enzymes recommended. No drainable fluid collection or abscess. Spleen: Calcification of the splenic capsule. Adrenals/Urinary Tract: The adrenal glands unremarkable. There is a linear 4 mm nonobstructing right renal upper pole calculus. No hydronephrosis. The left kidney is unremarkable. There is symmetric enhancement and excretion of contrast by both kidneys. The visualized ureters and urinary bladder appear unremarkable. Stomach/Bowel: There is apparent mild rectal prolapse. There is no bowel obstruction or active inflammation. The appendix is normal. Vascular/Lymphatic: Mild aortoiliac atherosclerotic disease. The IVC is unremarkable. No portal venous gas. There is no adenopathy. Reproductive: The prostate gland is mildly enlarged with median lobe hypertrophy. There is a 3.8 x 3.5 cm cystic structure at the base of the penis and inferior to the prostate gland. This may represent a urethral diverticulum. However, a prostatic abscess is not excluded clinical correlation is recommended. Other: None Musculoskeletal: No acute or significant osseous findings. IMPRESSION: 1. Possible acute pancreatitis. No drainable fluid collection or abscess. 2. Fatty liver. 3. Atrophic pancreas sequela of chronic pancreas. 4. A 4 mm nonobstructing right renal upper pole calculus. No hydronephrosis. 5. 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 Clinical correlation is recommended. 6. Aortic Atherosclerosis (ICD10-I70.0). Electronically Signed   By: Elgie Collard M.D.   On: 09/26/2021 01:01   US RENAL  Result Date: 09/26/2021 CLINICAL DATA:  Acute  renal injury. EXAM: RENAL / URINARY TRACT ULTRASOUND COMPLETE COMPARISON:  None. FINDINGS: Right Kidney: Renal measurements: 10.1 cm x 4.8 cm x 4.5 cm = volume: 114.34 mL. Diffusely increased echogenicity of the renal parenchyma is noted. No mass or hydronephrosis visualized. Left Kidney: Renal measurements: 9.8 cm x 5.0 cm x 4.8 cm = volume: 122.66 mL. Diffusely increased echogenicity of the renal parenchyma is noted. No mass or hydronephrosis visualized. Bladder: Mild amount of debris is seen within the dependent portion of the urinary bladder Other: A trace amount of perinephric fluid is seen on the right. IMPRESSION: 1. Increased renal echogenicity which may be secondary to medical renal disease. 2. Mild amount of debris within the urinary bladder. 3. Some out of right-sided perinephric fluid. Electronically Signed   By: Aram Candela M.D.   On: 09/26/2021 01:17   DG Chest Port 1 View  Result Date: 09/25/2021 CLINICAL DATA:  Questionable sepsis. EXAM: PORTABLE CHEST 1 VIEW COMPARISON:  Chest radiograph dated 04/04/2017. FINDINGS: No focal consolidation, pleural effusion, or pneumothorax. The cardiac silhouette is within normal limits. No acute osseous pathology. IMPRESSION: No active disease. Electronically Signed   By: Elgie Collard M.D.   On: 09/25/2021 20:12    I independently reviewed the above imaging studies.  Impression/Recommendation Possible prostate abscess: CT A/P 09/26/2021 with 3.8 x 3.5 cm cystic structure inferior to the prostate and at the base of the penis.  Initially associated with fevers, tachycardia, hypotension, leukocytosis at 18.8, no urinalysis or urine culture has resulted. Sepsis from UTI: Diagnosed with UTI on 10/19 with multiple species present.  Presented with fevers, hypotension, imaging with  possible prostate abscess with cystic structure inferior to the prostate.  His fever curve is down trended. AKI: Cr 1.34 at consultation from baseline 1.1 Colitis: He has  severe diarrhea.  Work-up for C. difficile colitis is pending.  -I discussed with patient at bedside and reviewed his imaging.  It appears that the cystic structure is inferior to the prostate and may represent a urethral diverticulum however is likely distal to the external urethral sphincter.  I plan to take him to the operating room for a good exam under anesthesia, cystoscopy, possible transurethral resection of abscess.  However, if this is too distal, I will be unable to unroofed this transurethrally as this would leave him with incontinence.  I asked general surgery to review his imaging and we may need to proceed with rectal ultrasound with possible aspiration or drainage. -We will plan to leave Foley catheter given distended bladder on imaging -Continue antibiotics.  He is currently on cefepime.  These may need to be broadened. -Follow-up urine culture  Matt R. Angad Nabers MD 09/26/2021, 12:59 PM  [facility]  Pager: [phone]  CC: Jeoffrey Massed, MD

## 2021-09-26 NOTE — Progress Notes (Signed)
[id number] patient alert talkative transported to [facility] for procedure in am report given to [facility] RN earlier in shift patient transported via stretcher and [facility]

## 2021-09-26 NOTE — Progress Notes (Addendum)
PROGRESS NOTE        PATIENT DETAILS Name: [name] Age: 60 y.o. Sex: male Date of Birth: 08/17/1961 Admit Date: 09/25/2021 Admitting Physician John Giovanni, MD PCP:Pcp, No  Brief Narrative: Patient is a 60 y.o. male with history of EtOH use, CVA-hold presented with diarrhea/urinary frequency-found to have septic shock-from suspected complicated UTI.  See below for further details.  Subjective: Lying comfortably in bed-denies any chest pain or shortness of breath.  He feels much better than yesterday.  Claims last drink was approximately 1 week back.  Continues to have some loose stools but seems to have slowed down.  Objective: Vitals: Blood pressure 91/72, pulse 100, temperature 98.6 F (37 C), temperature source Oral, resp. rate (!) 25, SpO2 98 %.   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 10/27>> renal ultrasound: No hydronephrosis, right-sided perinephric fluid.  Assessment/Plan: Sepsis due to complicated UTI/Prostate abscess-+/- colitis: Sepsis physiology is improving-blood pressure stabilized-continue empiric IV antibiotics-and await cultures/stool studies.  Diarrhea: Was on antibiotics prior to this hospitalization-unclear whether diarrhea is related to antibiotics or has developed C. difficile colitis.  Awaiting stool studies-however diarrhea seems to have improved compared to admission.  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  Addendum 6:50 pm Spoke with Urology Dr Gay-unfortunately given the location of the fluid collection, patients abscess was not drained in the OR today (see op note)-he recommends transfer to [facility] where transrectal ultrasound probe is available. Procedure will be re-attempted tomorrow-keep NPO post midnight. Will transfer to [facility] hospital.   Hypokalemia/hypomagnesemia/hypophosphatemia: Due to GI loss/alcohol use-Continue to replete and recheck.  AKI: Likely hemodynamically mediated-improving-continue IVF.  EtOH use: Claims last drink was approximately 1 week back-no signs of alcohol use-continue to watch closely.  Continue Ativan per CIWA protocol.  Procedures: None Consults: None DVT Prophylaxis: Hold starting Lovenox-until it is clear whether patient is going to require drainage of his prostate abscess. Code Status:Full code  Family Communication: None at bedside  Time spent: 35 minutes-Greater than 50% of this time was spent in counseling, explanation of diagnosis, planning of further management, and coordination of care.  Diet: Diet Order             Diet NPO time specified  Diet effective now                      Disposition Plan: Status is: Inpatient  Remains inpatient appropriate because: Inpatient level of care   Barriers to Discharge: Resolving septic shock-await cultures-on IV antibiotics  Antimicrobial agents: Anti-infectives (From admission, onward)    Start     Dose/Rate Route Frequency Ordered Stop   09/26/21 2200  vancomycin (VANCOCIN) IVPB 1000 mg/200 mL premix        1,000 mg 200 mL/hr over 60 Minutes Intravenous Every 24 hours 09/25/21 2040     09/26/21 0830  ceFEPIme (MAXIPIME) 2 g in  sodium chloride 0.9 % 100 mL IVPB        2 g 200 mL/hr over 30 Minutes Intravenous Every 12 hours 09/25/21 2040      09/26/21 0400  metroNIDAZOLE (FLAGYL) IVPB 500 mg        500 mg 100 mL/hr over 60 Minutes Intravenous Every 8 hours 09/26/21 0156     09/25/21 2000  ceFEPIme (MAXIPIME) 2 g in sodium chloride 0.9 % 100 mL IVPB        2 g 200 mL/hr over 30 Minutes Intravenous  Once 09/25/21 1949 09/25/21 2108   09/25/21 2000  metroNIDAZOLE (FLAGYL) IVPB 500 mg        500 mg 100 mL/hr over 60 Minutes Intravenous  Once 09/25/21 1949 09/25/21 2240   09/25/21 2000  vancomycin (VANCOREADY) IVPB 1250 mg/250 mL        1,250 mg 166.7 mL/hr over 90 Minutes Intravenous  Once 09/25/21 1949 09/25/21 2300        MEDICATIONS: Scheduled Meds:  folic acid  1 mg Oral Daily   multivitamin with minerals  1 tablet Oral Daily   thiamine injection  100 mg Intravenous Daily   Continuous Infusions:  ceFEPime (MAXIPIME) IV Stopped (09/26/21 0908)   lactated ringers 150 mL/hr at 09/26/21 0838   metronidazole Stopped (09/26/21 0449)   vancomycin     PRN Meds:.acetaminophen **OR** acetaminophen, LORazepam **OR** LORazepam   I have personally reviewed following labs and imaging studies  LABORATORY DATA: CBC: Recent Labs  Lab 09/25/21 1906 09/26/21 0235  WBC 10.8* 18.8*  NEUTROABS 9.9*  --   HGB 11.7* 10.4*  HCT 35.1* 31.4*  MCV 103.8* 104.3*  PLT 219 179    Basic Metabolic Panel: Recent Labs  Lab 09/25/21 1906 09/26/21 0235  NA 135 136  K 3.3* 3.0*  CL 106 110  CO2 17* 16*  GLUCOSE 126* 108*  BUN 8 8  CREATININE 1.44* 1.34*  CALCIUM 8.1* 8.0*  MG  --  1.5*  PHOS  --  1.7*    GFR: Estimated Creatinine Clearance: 53.3 mL/min (A) (by C-G formula based on SCr of 1.34 mg/dL (H)).  Liver Function Tests: Recent Labs  Lab 09/25/21 1906 09/26/21 0235  AST 49* 78*  ALT 19 22  ALKPHOS 103 86  BILITOT 0.7 0.6  PROT 6.4* 5.7*  ALBUMIN 2.3* 2.0*   Recent Labs  Lab 09/25/21 1906  LIPASE 17   No results for input(s): AMMONIA in the last 168 hours.  Coagulation Profile: Recent Labs  Lab  09/25/21 2033 09/26/21 0235  INR 1.5* 1.7*    Cardiac Enzymes: No results for input(s): CKTOTAL, CKMB, CKMBINDEX, TROPONINI in the last 168 hours.  BNP (last 3 results) No results for input(s): PROBNP in the last 8760 hours.  Lipid Profile: No results for input(s): CHOL, HDL, LDLCALC, TRIG, CHOLHDL, LDLDIRECT in the last 72 hours.  Thyroid Function Tests: No results for input(s): TSH, T4TOTAL, FREET4, T3FREE, THYROIDAB in the last 72 hours.  Anemia Panel: No results for input(s): VITAMINB12, FOLATE, FERRITIN, TIBC, IRON, RETICCTPCT in the last 72 hours.  Urine analysis:    Component Value Date/Time   COLORURINE YELLOW 09/25/2021 1906   APPEARANCEUR HAZY (A) 09/25/2021 1906   LABSPEC 1.004 (L) 09/25/2021 1906   PHURINE 6.0 09/25/2021 1906   GLUCOSEU NEGATIVE 09/25/2021 1906   HGBUR MODERATE (A) 09/25/2021 1906   BILIRUBINUR NEGATIVE 09/25/2021 1906   KETONESUR NEGATIVE 09/25/2021 1906   PROTEINUR NEGATIVE 09/25/2021 1906   UROBILINOGEN 1.0 03/30/2011  8676   NITRITE NEGATIVE 09/25/2021 1906   LEUKOCYTESUR LARGE (A) 09/25/2021 1906    Sepsis Labs: Lactic Acid, Venous    Component Value Date/Time   LATICACIDVEN 3.8 (HH) 09/26/2021 0452    MICROBIOLOGY: Recent Results (from the past 240 hour(s))  Urine Culture     Status: Abnormal   Collection Time: 09/18/21  7:18 AM   Specimen: Urine, Clean Catch  Result Value Ref Range Status   Specimen Description URINE, CLEAN CATCH  Final   Special Requests   Final    Normal Performed at [facility], [address], [place], [place] [address]    Culture MULTIPLE SPECIES PRESENT, SUGGEST RECOLLECTION (A)  Final   Report Status 09/19/2021 FINAL  Final  Resp Panel by RT-PCR (Flu A&B, Covid) Nasopharyngeal Swab     Status: None   Collection Time: 09/25/21  7:07 PM   Specimen: Nasopharyngeal Swab; Nasopharyngeal(NP) swabs in vial transport medium  Result Value Ref Range Status   SARS Coronavirus 2 by RT PCR NEGATIVE  NEGATIVE Final    Comment: (NOTE) SARS-CoV-2 target nucleic acids are NOT DETECTED.  The SARS-CoV-2 RNA is generally detectable in upper respiratory specimens during the acute phase of infection. The lowest concentration of SARS-CoV-2 viral copies this assay can detect is 138 copies/mL. A negative result does not preclude SARS-Cov-2 infection and should not be used as the sole basis for treatment or other patient management decisions. A negative result may occur with  improper specimen collection/handling, submission of specimen other than nasopharyngeal swab, presence of viral mutation(s) within the areas targeted by this assay, and inadequate number of viral copies(<138 copies/mL). A negative result must be combined with clinical observations, patient history, and epidemiological information. The expected result is Negative.  Fact Sheet for Patients:  BloggerCourse.com  Fact Sheet for Healthcare Providers:  SeriousBroker.it  This test is no t yet approved or cleared by the [place] FDA and  has been authorized for detection and/or diagnosis of SARS-CoV-2 by FDA under an Emergency Use Authorization (EUA). This EUA will remain  in effect (meaning this test can be used) for the duration of the COVID-19 declaration under Section 564(b)(1) of the Act, 21 U.S.C.section 360bbb-3(b)(1), unless the authorization is terminated  or revoked sooner.       Influenza A by PCR NEGATIVE NEGATIVE Final   Influenza B by PCR NEGATIVE NEGATIVE Final    Comment: (NOTE) The Xpert Xpress SARS-CoV-2/FLU/RSV plus assay is intended as an aid in the diagnosis of influenza from Nasopharyngeal swab specimens and should not be used as a sole basis for treatment. Nasal washings and aspirates are unacceptable for Xpert Xpress SARS-CoV-2/FLU/RSV testing.  Fact Sheet for Patients: BloggerCourse.com  Fact Sheet for Healthcare  Providers: SeriousBroker.it  This test is not yet approved or cleared by the [place] FDA and has been authorized for detection and/or diagnosis of SARS-CoV-2 by FDA under an Emergency Use Authorization (EUA). This EUA will remain in effect (meaning this test can be used) for the duration of the COVID-19 declaration under Section 564(b)(1) of the Act, 21 U.S.C. section 360bbb-3(b)(1), unless the authorization is terminated or revoked.  Performed at [facility], [address], [place], [place] [address]   Blood Culture (routine x 2)     Status: None (Preliminary result)   Collection Time: 09/25/21  8:33 PM   Specimen: BLOOD  Result Value Ref Range Status   Specimen Description BLOOD LEFT ANTECUBITAL  Final   Special Requests  Final    BOTTLES DRAWN AEROBIC AND ANAEROBIC Blood Culture adequate volume   Culture   Final    NO GROWTH < 12 HOURS Performed at [facility], [address] [place], [place] [address]    Report Status PENDING  Incomplete  Blood Culture (routine x 2)     Status: None (Preliminary result)   Collection Time: 09/25/21  8:33 PM   Specimen: BLOOD  Result Value Ref Range Status   Specimen Description BLOOD RIGHT ANTECUBITAL  Final   Special Requests   Final    BOTTLES DRAWN AEROBIC AND ANAEROBIC Blood Culture adequate volume   Culture   Final    NO GROWTH < 12 HOURS Performed at [facility], [address], [place], [place] [address]    Report Status PENDING  Incomplete    RADIOLOGY STUDIES/RESULTS: CT ABDOMEN PELVIS W CONTRAST  Result Date: 09/26/2021 CLINICAL DATA:  Abdominal pain. EXAM: CT ABDOMEN AND PELVIS WITH CONTRAST TECHNIQUE: Multidetector CT imaging of the abdomen and pelvis was performed using the standard protocol following bolus administration of intravenous contrast. CONTRAST:  71mL OMNIPAQUE IOHEXOL 300 MG/ML  SOLN COMPARISON:  CT abdomen pelvis dated 03/30/2011. FINDINGS: Lower chest: The  visualized lung bases are clear. There is coronary vascular calcification. No intra-abdominal free air or free fluid. Hepatobiliary: Diffuse fatty liver. There is diffuse thickening of the gallbladder wall, likely related to liver dysfunction. No calcified gallstone. Similar findings were seen on the prior CT of 2012. Pancreas: The pancreas is atrophic, sequela of chronic pancreas. There is however diffuse stranding and edema of the upper mesentery in the region of the pancreas concerning for acute pancreatitis. Correlation with pancreatic enzymes recommended. No drainable fluid collection or abscess. Spleen: Calcification of the splenic capsule. Adrenals/Urinary Tract: The adrenal glands unremarkable. There is a linear 4 mm nonobstructing right renal upper pole calculus. No hydronephrosis. The left kidney is unremarkable. There is symmetric enhancement and excretion of contrast by both kidneys. The visualized ureters and urinary bladder appear unremarkable. Stomach/Bowel: There is apparent mild rectal prolapse. There is no bowel obstruction or active inflammation. The appendix is normal. Vascular/Lymphatic: Mild aortoiliac atherosclerotic disease. The IVC is unremarkable. No portal venous gas. There is no adenopathy. Reproductive: The prostate gland is mildly enlarged with median lobe hypertrophy. There is a 3.8 x 3.5 cm cystic structure at the base of the penis and inferior to the prostate gland. This may represent a urethral diverticulum. However, a prostatic abscess is not excluded clinical correlation is recommended. Other: None Musculoskeletal: No acute or significant osseous findings. IMPRESSION: 1. Possible acute pancreatitis. No drainable fluid collection or abscess. 2. Fatty liver. 3. Atrophic pancreas sequela of chronic pancreas. 4. A 4 mm nonobstructing right renal upper pole calculus. No hydronephrosis. 5. 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 Clinical correlation is recommended.  6. Aortic Atherosclerosis (ICD10-I70.0). Electronically Signed   By: Elgie Collard M.D.   On: 09/26/2021 01:01   US RENAL  Result Date: 09/26/2021 CLINICAL DATA:  Acute renal injury. EXAM: RENAL / URINARY TRACT ULTRASOUND COMPLETE COMPARISON:  None. FINDINGS: Right Kidney: Renal measurements: 10.1 cm x 4.8 cm x 4.5 cm = volume: 114.34 mL. Diffusely increased echogenicity of the renal parenchyma is noted. No mass or hydronephrosis visualized. Left Kidney: Renal measurements: 9.8 cm x 5.0 cm x 4.8 cm = volume: 122.66 mL. Diffusely increased echogenicity of the renal parenchyma is noted. No mass or hydronephrosis visualized. Bladder: Mild amount of debris  is seen within the dependent portion of the urinary bladder Other: A trace amount of perinephric fluid is seen on the right. IMPRESSION: 1. Increased renal echogenicity which may be secondary to medical renal disease. 2. Mild amount of debris within the urinary bladder. 3. Some out of right-sided perinephric fluid. Electronically Signed   By: Aram Candela M.D.   On: 09/26/2021 01:17   DG Chest Port 1 View  Result Date: 09/25/2021 CLINICAL DATA:  Questionable sepsis. EXAM: PORTABLE CHEST 1 VIEW COMPARISON:  Chest radiograph dated 04/04/2017. FINDINGS: No focal consolidation, pleural effusion, or pneumothorax. The cardiac silhouette is within normal limits. No acute osseous pathology. IMPRESSION: No active disease. Electronically Signed   By: Elgie Collard M.D.   On: 09/25/2021 20:12     LOS: 0 days   Jeoffrey Massed, MD  [facility]    To contact the attending provider between 7A-7P or the covering provider during after hours 7P-7A, please log into the web site [email or web address] and access using universal [place] password for that web site. If you do not have the password, please call the hospital operator.  09/26/2021, 11:33 AM

## 2021-09-26 NOTE — Sepsis Progress Note (Signed)
3rd Lactic was collected at 2307 per chart, but no result. This RN called lab and they stated they don't see the lactic I am referring to. Will contact bedside RN to draw a new lactic acid.

## 2021-09-26 NOTE — Progress Notes (Signed)
Discussed OR findings with patient. Unable to locate prostate abscess transurethrally and there was no rectal ultrasound probe at [facility]. I recommend transfer to [facility] and I will plan on taking him to OR tomorrow afternoon for transrectal ultrasound guided aspiration of this abscess. He is doing well, tolerating foley with clear yellow urine with CBI clamped.  Matt R. Caidan Hubbert MD [facility]  Pager: [phone]

## 2021-09-26 NOTE — Anesthesia Postprocedure Evaluation (Signed)
Anesthesia Post Note  Patient: [name]  Procedure(s) Performed: CYSTOSCOPY TRANS URETERAL RESECTION PROSTATE EXAM UNDER ANESTHESIA     Patient location during evaluation: PACU Anesthesia Type: General Level of consciousness: awake Pain management: pain level controlled Vital Signs Assessment: post-procedure vital signs reviewed and stable Respiratory status: spontaneous breathing Cardiovascular status: stable Postop Assessment: no apparent nausea or vomiting Anesthetic complications: no   No notable events documented.  Last Vitals:  Vitals:   09/26/21 1120 09/26/21 1440  BP: 91/72 98/69  Pulse: 100 95  Resp: (!) 25 (!) 24  Temp:  36.8 C  SpO2: 98% 95%    Last Pain:  Vitals:   09/26/21 1440  TempSrc: Oral  PainSc: 0-No pain                 Suheily Birks

## 2021-09-26 NOTE — Progress Notes (Signed)
NAME:  [name], MRN:  [id number], DOB:  1961/06/02, LOS: 0 ADMISSION DATE:  09/25/2021, CONSULTATION DATE:  09/26/2021  REFERRING MD:  Arthor Captain, PA ED , CHIEF COMPLAINT: Lactic acidosis  History of Present Illness:  60 year old man with EtOH abuse brought in by EMS complaining of diarrhea and frequent urination.  He was seen in the ED on 10/19, diagnosed with a UTI, urine culture showed multiple organisms, given Keflex.  He normally drinks 4 cans of beer daily but stopped drinking a week ago when he was on the antibiotic. Initially noted to be febrile 101.9, hypotensive and tachycardia, given 2 L of fluid , initial lactate was 6.9 and repeat lactate 3 hours later was 7.0, hence PCCM consulted. He was given empiric cefepime and vancomycin and Flagyl All the labs significant for mild hypokalemia, creatinine 1.4, borderline leukocytosis 10.8 with left shift  Pertinent  Medical History  CVA no residual EtOH abuse Prior UTI  Significant Hospital Events: Including procedures, antibiotic start and stop dates in addition to other pertinent events     Interim History / Subjective:    Objective   Blood pressure (!) 87/68, pulse (!) 105, temperature 98.9 F (37.2 C), temperature source Oral, resp. rate (!) 25, SpO2 96 %.       No intake or output data in the 24 hours ending 09/26/21 0911 There were no vitals filed for this visit.  Examination: General:  nad at rest HEENT: MM pink/moist no jvd Neuro: intact CV: hsr PULM:  CTA sats 100% GI: soft, bsx4 active +bs AS:[id number] Extremities: warm/dry,  negativeedema  Skin: no rashes or lesions   Resolved Hospital Problem list     Assessment & Plan:  He does seem to have an infection as indicated by fever and left shift , he continues to have greater than 50 white cells in his urine.  He was treated with Keflex so concern here would be for resistant UTI.  Prior imaging in 2018 does not show any abnormality of his urinary tract  and he does not give obvious symptoms of prostatitis.  His blood pressure and tachycardia is improved with fluids , but lactate is persistent which may indicate decreased clearance due to liver disease.  No other reason for lactate is evident on clinical exam.  He appears volume depleted in spite of 2 L of fluid which may also be due to diarrhea  Septic shock, occult Continue abx  Lactic acidosis Lactic acid clearing    UTI, suspect resistant organism, failed outpatient treatment with Keflex  Volume as needed Abx Lactic clearing slowly BP adequate  Elevated creatine  Lab Results  Component Value Date   CREATININE 1.34 (H) 09/26/2021   CREATININE 1.44 (H) 09/25/2021   CREATININE 1.05 05/18/2021   improving Renal US no hyronephrosis   ETOH abuse CIWA protocol  PCCM available as needed   Labs   CBC: Recent Labs  Lab 09/25/21 1906 09/26/21 0235  WBC 10.8* 18.8*  NEUTROABS 9.9*  --   HGB 11.7* 10.4*  HCT 35.1* 31.4*  MCV 103.8* 104.3*  PLT 219 179    Basic Metabolic Panel: Recent Labs  Lab 09/25/21 1906 09/26/21 0235  NA 135 136  K 3.3* 3.0*  CL 106 110  CO2 17* 16*  GLUCOSE 126* 108*  BUN 8 8  CREATININE 1.44* 1.34*  CALCIUM 8.1* 8.0*  MG  --  1.5*  PHOS  --  1.7*   GFR: Estimated Creatinine Clearance: 53.3 mL/min (A) (by  C-G formula based on SCr of 1.34 mg/dL (H)). Recent Labs  Lab 09/25/21 1906 09/25/21 2145 09/26/21 0235 09/26/21 0452  WBC 10.8*  --  18.8*  --   LATICACIDVEN 6.9* 7.0* 5.0* 3.8*    Liver Function Tests: Recent Labs  Lab 09/25/21 1906 09/26/21 0235  AST 49* 78*  ALT 19 22  ALKPHOS 103 86  BILITOT 0.7 0.6  PROT 6.4* 5.7*  ALBUMIN 2.3* 2.0*   Recent Labs  Lab 09/25/21 1906  LIPASE 17   No results for input(s): AMMONIA in the last 168 hours.  ABG    Component Value Date/Time   TCO2 25 01/17/2009 0149     Coagulation Profile: Recent Labs  Lab 09/25/21 2033 09/26/21 0235  INR 1.5* 1.7*    Cardiac  Enzymes: No results for input(s): CKTOTAL, CKMB, CKMBINDEX, TROPONINI in the last 168 hours.  HbA1C: Hgb A1c MFr Bld  Date/Time Value Ref Range Status  02/10/2020 06:01 AM 4.6 (L) 4.8 - 5.6 % Final    Comment:    (NOTE) Pre diabetes:          5.7%-6.4% Diabetes:              >6.4% Glycemic control for   <7.0% adults with diabetes     CBG: No results for input(s): GLUCAP in the last 168 hours.   Brett Canales Zakeya Junker ACNP Acute Care Nurse Practitioner [facility] Please consult Amion 09/26/2021, 9:11 AM

## 2021-09-26 NOTE — Progress Notes (Signed)
Day of Surgery Subjective: Denies pain. No nausea or emesis.  Objective: Vital signs in last 24 hours: Temp:  [97 F (36.1 C)-101.2 F (38.4 C)] 97 F (36.1 C) (10/27 2050) Pulse Rate:  [64-132] 84 (10/27 2050) Resp:  [11-34] 23 (10/27 2050) BP: (82-113)/(63-91) 103/78 (10/27 2050) SpO2:  [95 %-100 %] 99 % (10/27 2050)  Intake/Output from previous day: No intake/output data recorded. Intake/Output this shift: Total I/O In: -  Out: 300 [Urine:300]  Physical Exam:  General: Alert and oriented CV: RRR Lungs: Clear Abdomen: Soft, ND, NT Ext: NT, No erythema  Lab Results: Recent Labs    09/25/21 1906 09/26/21 0235  HGB 11.7* 10.4*  HCT 35.1* 31.4*   BMET Recent Labs    09/25/21 1906 09/26/21 0235  NA 135 136  K 3.3* 3.0*  CL 106 110  CO2 17* 16*  GLUCOSE 126* 108*  BUN 8 8  CREATININE 1.44* 1.34*  CALCIUM 8.1* 8.0*     Studies/Results: CT ABDOMEN PELVIS W CONTRAST  Result Date: 09/26/2021 CLINICAL DATA:  Abdominal pain. EXAM: CT ABDOMEN AND PELVIS WITH CONTRAST TECHNIQUE: Multidetector CT imaging of the abdomen and pelvis was performed using the standard protocol following bolus administration of intravenous contrast. CONTRAST:  44mL OMNIPAQUE IOHEXOL 300 MG/ML  SOLN COMPARISON:  CT abdomen pelvis dated 03/30/2011. FINDINGS: Lower chest: The visualized lung bases are clear. There is coronary vascular calcification. No intra-abdominal free air or free fluid. Hepatobiliary: Diffuse fatty liver. There is diffuse thickening of the gallbladder wall, likely related to liver dysfunction. No calcified gallstone. Similar findings were seen on the prior CT of 2012. Pancreas: The pancreas is atrophic, sequela of chronic pancreas. There is however diffuse stranding and edema of the upper mesentery in the region of the pancreas concerning for acute pancreatitis. Correlation with pancreatic enzymes recommended. No drainable fluid collection or abscess. Spleen: Calcification of  the splenic capsule. Adrenals/Urinary Tract: The adrenal glands unremarkable. There is a linear 4 mm nonobstructing right renal upper pole calculus. No hydronephrosis. The left kidney is unremarkable. There is symmetric enhancement and excretion of contrast by both kidneys. The visualized ureters and urinary bladder appear unremarkable. Stomach/Bowel: There is apparent mild rectal prolapse. There is no bowel obstruction or active inflammation. The appendix is normal. Vascular/Lymphatic: Mild aortoiliac atherosclerotic disease. The IVC is unremarkable. No portal venous gas. There is no adenopathy. Reproductive: The prostate gland is mildly enlarged with median lobe hypertrophy. There is a 3.8 x 3.5 cm cystic structure at the base of the penis and inferior to the prostate gland. This may represent a urethral diverticulum. However, a prostatic abscess is not excluded clinical correlation is recommended. Other: None Musculoskeletal: No acute or significant osseous findings. IMPRESSION: 1. Possible acute pancreatitis. No drainable fluid collection or abscess. 2. Fatty liver. 3. Atrophic pancreas sequela of chronic pancreas. 4. A 4 mm nonobstructing right renal upper pole calculus. No hydronephrosis. 5. 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 Clinical correlation is recommended. 6. Aortic Atherosclerosis (ICD10-I70.0). Electronically Signed   By: Elgie Collard M.D.   On: 09/26/2021 01:01   US RENAL  Result Date: 09/26/2021 CLINICAL DATA:  Acute renal injury. EXAM: RENAL / URINARY TRACT ULTRASOUND COMPLETE COMPARISON:  None. FINDINGS: Right Kidney: Renal measurements: 10.1 cm x 4.8 cm x 4.5 cm = volume: 114.34 mL. Diffusely increased echogenicity of the renal parenchyma is noted. No mass or hydronephrosis visualized. Left Kidney: Renal measurements: 9.8 cm x 5.0 cm x 4.8 cm = volume: 122.66  mL. Diffusely increased echogenicity of the renal parenchyma is noted. No mass or hydronephrosis  visualized. Bladder: Mild amount of debris is seen within the dependent portion of the urinary bladder Other: A trace amount of perinephric fluid is seen on the right. IMPRESSION: 1. Increased renal echogenicity which may be secondary to medical renal disease. 2. Mild amount of debris within the urinary bladder. 3. Some out of right-sided perinephric fluid. Electronically Signed   By: Aram Candela M.D.   On: 09/26/2021 01:17   DG Chest Port 1 View  Result Date: 09/25/2021 CLINICAL DATA:  Questionable sepsis. EXAM: PORTABLE CHEST 1 VIEW COMPARISON:  Chest radiograph dated 04/04/2017. FINDINGS: No focal consolidation, pleural effusion, or pneumothorax. The cardiac silhouette is within normal limits. No acute osseous pathology. IMPRESSION: No active disease. Electronically Signed   By: Elgie Collard M.D.   On: 09/25/2021 20:12    Assessment/Plan: Possible prostate abscess: CT A/P 09/26/2021 with 3.8 x 3.5 cm cystic structure inferior to the prostate and at the base of the penis.  Initially associated with fevers, tachycardia, hypotension, leukocytosis at 18.8, no urinalysis or urine culture has resulted. S/p TURP 09/26/2021 with inability to locate prostatic abscess. Unfortunately no rectal ultrasound probe was present at [facility]. Sepsis from UTI: Diagnosed with UTI on 10/19 with multiple species present.  Presented with fevers, hypotension, imaging with possible prostate abscess with cystic structure inferior to the prostate.  His fever curve is down trended. AKI: Cr 1.34 at consultation from baseline 1.1 Colitis: He has severe diarrhea.  Work-up for C. difficile colitis is pending.  -To OR today for transrectal ultrasound guided aspiration of abscess. This needs to be done at [facility] as no transrectal ultrasound probe was available at [facility] yesterday. -If unsuccessful, will have IR place drain -Continue abx   LOS: 0 days   Matt R. Yovana Scogin MD 09/26/2021, 10:04 PM [facility]   Pager: [phone]

## 2021-09-27 ENCOUNTER — Encounter (HOSPITAL_COMMUNITY)
Admission: EM | Disposition: A | Discharge status: Home / Self Care | Payer: Self-pay | Source: Home / Self Care | Attending: Internal Medicine

## 2021-09-27 ENCOUNTER — Inpatient Hospital Stay (HOSPITAL_COMMUNITY): Payer: Self-pay | Admitting: Anesthesiology

## 2021-09-27 ENCOUNTER — Other Ambulatory Visit (HOSPITAL_COMMUNITY): Payer: Self-pay

## 2021-09-27 ENCOUNTER — Inpatient Hospital Stay (HOSPITAL_COMMUNITY): Payer: Self-pay

## 2021-09-27 ENCOUNTER — Encounter (HOSPITAL_COMMUNITY): Payer: Self-pay | Admitting: Urology

## 2021-09-27 DIAGNOSIS — R6520 Severe sepsis without septic shock: Secondary | ICD-10-CM

## 2021-09-27 HISTORY — PX: TRANSRECTAL ULTRASOUND: SHX5146

## 2021-09-27 LAB — GASTROINTESTINAL PANEL BY PCR, STOOL (REPLACES STOOL CULTURE)

## 2021-09-27 LAB — C DIFFICILE QUICK SCREEN W PCR REFLEX
C Diff antigen: NEGATIVE
C Diff interpretation: NOT DETECTED
C Diff toxin: NEGATIVE

## 2021-09-27 LAB — COMPREHENSIVE METABOLIC PANEL
ALT: 20 U/L (ref 0–44)
AST: 51 U/L — ABNORMAL HIGH (ref 15–41)
Albumin: 1.8 g/dL — ABNORMAL LOW (ref 3.5–5.0)
Alkaline Phosphatase: 59 U/L (ref 38–126)
Anion gap: 6 (ref 5–15)
BUN: 10 mg/dL (ref 6–20)
CO2: 17 mmol/L — ABNORMAL LOW (ref 22–32)
Calcium: 7.2 mg/dL — ABNORMAL LOW (ref 8.9–10.3)
Chloride: 111 mmol/L (ref 98–111)
Creatinine, Ser: 0.79 mg/dL (ref 0.61–1.24)
GFR, Estimated: >60 mL/min (ref >60)
Glucose, Bld: 186 mg/dL — ABNORMAL HIGH (ref 70–99)
Potassium: 3.1 mmol/L — ABNORMAL LOW (ref 3.5–5.1)
Sodium: 134 mmol/L — ABNORMAL LOW (ref 135–145)
Total Bilirubin: 0.6 mg/dL (ref 0.3–1.2)
Total Protein: 5.1 g/dL — ABNORMAL LOW (ref 6.5–8.1)

## 2021-09-27 LAB — CBC
HCT: 28.8 % — ABNORMAL LOW (ref 39.0–52.0)
Hemoglobin: 9.8 g/dL — ABNORMAL LOW (ref 13.0–17.0)
MCH: 35.4 pg — ABNORMAL HIGH (ref 26.0–34.0)
MCHC: 34 g/dL (ref 30.0–36.0)
MCV: 104 fL — ABNORMAL HIGH (ref 80.0–100.0)
Platelets: 101 10*3/uL — ABNORMAL LOW (ref 150–400)
RBC: 2.77 MIL/uL — ABNORMAL LOW (ref 4.22–5.81)
RDW: 14.1 % (ref 11.5–15.5)
WBC: 29 10*3/uL — ABNORMAL HIGH (ref 4.0–10.5)
nRBC: 0 % (ref 0.0–0.2)

## 2021-09-27 LAB — PHOSPHORUS: Phosphorus: 2.5 mg/dL (ref 2.5–4.6)

## 2021-09-27 LAB — MAGNESIUM: Magnesium: 1.8 mg/dL (ref 1.7–2.4)

## 2021-09-27 SURGERY — ULTRASOUND, RECTAL APPROACH
Anesthesia: General

## 2021-09-27 MED ORDER — PROPOFOL 10 MG/ML IV BOLUS
INTRAVENOUS | Status: DC | PRN
  Administered 2021-09-27: 150 mg via INTRAVENOUS

## 2021-09-27 MED ORDER — PHENYLEPHRINE 40 MCG/ML (10ML) SYRINGE FOR IV PUSH (FOR BLOOD PRESSURE SUPPORT)
PREFILLED_SYRINGE | INTRAVENOUS | Status: DC | PRN
  Administered 2021-09-27 (×3): 80 ug via INTRAVENOUS

## 2021-09-27 MED ORDER — OXYCODONE HCL 5 MG/5ML PO SOLN: 5.0000 mg | Freq: Once | ORAL | Status: DC | PRN

## 2021-09-27 MED ORDER — DEXMEDETOMIDINE (PRECEDEX) IN NS 20 MCG/5ML (4 MCG/ML) IV SYRINGE
PREFILLED_SYRINGE | INTRAVENOUS | Status: DC | PRN
  Administered 2021-09-27: 8 ug via INTRAVENOUS

## 2021-09-27 MED ORDER — MIDAZOLAM HCL 2 MG/2ML IJ SOLN
INTRAMUSCULAR | Status: AC
  Filled 2021-09-27: qty 2

## 2021-09-27 MED ORDER — OXYCODONE HCL 5 MG PO TABS: 5.0000 mg | ORAL_TABLET | Freq: Once | ORAL | Status: DC | PRN

## 2021-09-27 MED ORDER — LIDOCAINE HCL 2 % IJ SOLN
INTRAMUSCULAR | Status: AC
  Filled 2021-09-27: qty 20

## 2021-09-27 MED ORDER — STERILE WATER FOR IRRIGATION IR SOLN
Status: DC | PRN
  Administered 2021-09-27: 1000 mL

## 2021-09-27 MED ORDER — METRONIDAZOLE 500 MG/100ML IV SOLN
500.0000 mg | Freq: Two times a day (BID) | INTRAVENOUS | Status: DC
  Administered 2021-09-27 – 2021-09-30 (×6): 500 mg via INTRAVENOUS
  Filled 2021-09-27 (×6): qty 100

## 2021-09-27 MED ORDER — PROMETHAZINE HCL 25 MG/ML IJ SOLN: 6.2500 mg | INTRAMUSCULAR | Status: DC | PRN

## 2021-09-27 MED ORDER — FENTANYL CITRATE (PF) 100 MCG/2ML IJ SOLN
INTRAMUSCULAR | Status: DC | PRN
  Administered 2021-09-27 (×2): 25 ug via INTRAVENOUS

## 2021-09-27 MED ORDER — CHLORHEXIDINE GLUCONATE CLOTH 2 % EX PADS
6.0000 | MEDICATED_PAD | Freq: Every day | CUTANEOUS | Status: DC
  Administered 2021-09-27 – 2021-09-29 (×3): 6 via TOPICAL

## 2021-09-27 MED ORDER — SODIUM CHLORIDE 0.9 % IV SOLN
2.0000 g | Freq: Three times a day (TID) | INTRAVENOUS | Status: DC
  Administered 2021-09-27 – 2021-09-30 (×10): 2 g via INTRAVENOUS
  Filled 2021-09-27 (×12): qty 2

## 2021-09-27 MED ORDER — LACTATED RINGERS IV SOLN: INTRAVENOUS | Status: DC

## 2021-09-27 MED ORDER — POTASSIUM CHLORIDE 10 MEQ/100ML IV SOLN
10.0000 meq | INTRAVENOUS | Status: AC
  Administered 2021-09-27 (×4): 10 meq via INTRAVENOUS
  Filled 2021-09-27 (×4): qty 100

## 2021-09-27 MED ORDER — VANCOMYCIN HCL 750 MG/150ML IV SOLN
750.0000 mg | Freq: Two times a day (BID) | INTRAVENOUS | Status: DC
  Administered 2021-09-27 – 2021-09-29 (×6): 750 mg via INTRAVENOUS
  Filled 2021-09-27 (×7): qty 150

## 2021-09-27 MED ORDER — PHENYLEPHRINE 40 MCG/ML (10ML) SYRINGE FOR IV PUSH (FOR BLOOD PRESSURE SUPPORT)
PREFILLED_SYRINGE | INTRAVENOUS | Status: AC
  Filled 2021-09-27: qty 10

## 2021-09-27 MED ORDER — LIDOCAINE 2% (20 MG/ML) 5 ML SYRINGE
INTRAMUSCULAR | Status: DC | PRN
  Administered 2021-09-27: 40 mg via INTRAVENOUS

## 2021-09-27 MED ORDER — POTASSIUM CHLORIDE 10 MEQ/100ML IV SOLN
10.0000 meq | INTRAVENOUS | Status: AC
  Administered 2021-09-27 – 2021-09-28 (×2): 10 meq via INTRAVENOUS
  Filled 2021-09-27 (×2): qty 100

## 2021-09-27 MED ORDER — PROPOFOL 10 MG/ML IV BOLUS
INTRAVENOUS | Status: AC
  Filled 2021-09-27: qty 20

## 2021-09-27 MED ORDER — CHLORHEXIDINE GLUCONATE CLOTH 2 % EX PADS: 6.0000 | MEDICATED_PAD | Freq: Every day | CUTANEOUS | Status: DC

## 2021-09-27 MED ORDER — MIDAZOLAM HCL 5 MG/5ML IJ SOLN
INTRAMUSCULAR | Status: DC | PRN
  Administered 2021-09-27: 2 mg via INTRAVENOUS

## 2021-09-27 MED ORDER — ONDANSETRON HCL 4 MG/2ML IJ SOLN
INTRAMUSCULAR | Status: AC
  Filled 2021-09-27: qty 2

## 2021-09-27 MED ORDER — AMISULPRIDE (ANTIEMETIC) 5 MG/2ML IV SOLN: 10.0000 mg | Freq: Once | INTRAVENOUS | Status: DC | PRN

## 2021-09-27 MED ORDER — FENTANYL CITRATE PF 50 MCG/ML IJ SOSY: 25.0000 ug | PREFILLED_SYRINGE | INTRAMUSCULAR | Status: DC | PRN

## 2021-09-27 MED ORDER — ACETAMINOPHEN 10 MG/ML IV SOLN: 1000.0000 mg | Freq: Once | INTRAVENOUS | Status: DC | PRN

## 2021-09-27 MED ORDER — FENTANYL CITRATE (PF) 100 MCG/2ML IJ SOLN
INTRAMUSCULAR | Status: AC
  Filled 2021-09-27: qty 2

## 2021-09-27 MED ORDER — ONDANSETRON HCL 4 MG/2ML IJ SOLN
INTRAMUSCULAR | Status: DC | PRN
  Administered 2021-09-27: 4 mg via INTRAVENOUS

## 2021-09-27 SURGICAL SUPPLY — 8 items
CATH FOLEY 2WAY SLVR  5CC 18FR (CATHETERS) ×2
CATH FOLEY 2WAY SLVR 5CC 18FR (CATHETERS) IMPLANT
INST BIOPSY MAXCORE 18GX25 (NEEDLE) IMPLANT
INSTR BIOPSY MAXCORE 18GX20 (NEEDLE) IMPLANT
NDL SPNL 22GX7 QUINCKE BK (NEEDLE) ×1 IMPLANT
NEEDLE SPNL 22GX7 QUINCKE BK (NEEDLE) ×2 IMPLANT
SYR CONTROL 10ML LL (SYRINGE) IMPLANT
UNDERPAD 30X36 HEAVY ABSORB (UNDERPADS AND DIAPERS) ×2 IMPLANT

## 2021-09-27 NOTE — Assessment & Plan Note (Signed)
-   repleting as needed 

## 2021-09-27 NOTE — Anesthesia Postprocedure Evaluation (Signed)
Anesthesia Post Note  Patient: [name]  Procedure(s) Performed: TRANSRECTAL ULTRASOUND WITH ASPIRATION OF PROSTATE ABSCESS     Patient location during evaluation: PACU Anesthesia Type: General Level of consciousness: awake and alert Pain management: pain level controlled Vital Signs Assessment: post-procedure vital signs reviewed and stable Respiratory status: spontaneous breathing, nonlabored ventilation, respiratory function stable and patient connected to nasal cannula oxygen Cardiovascular status: blood pressure returned to baseline and stable Postop Assessment: no apparent nausea or vomiting Anesthetic complications: no   No notable events documented.  Last Vitals:  Vitals:   09/27/21 1700 09/27/21 1715  BP: 97/76 94/73  Pulse: 72 72  Resp: 12 13  Temp:  36.4 C  SpO2: 99% 98%    Last Pain:  Vitals:   09/27/21 1715  TempSrc:   PainSc: 0-No pain                 Shelton Silvas

## 2021-09-27 NOTE — Anesthesia Procedure Notes (Signed)
Procedure Name: LMA Insertion Date/Time: 09/27/2021 3:45 PM Performed by: Florene Route, CRNA Patient Re-evaluated:Patient Re-evaluated prior to induction Oxygen Delivery Method: Circle system utilized Preoxygenation: Pre-oxygenation with 100% oxygen Induction Type: IV induction Ventilation: Mask ventilation without difficulty LMA: LMA inserted LMA Size: 4.0 Number of attempts: 1 Placement Confirmation: positive ETCO2 and breath sounds checked- equal and bilateral Tube secured with: Tape Dental Injury: Teeth and Oropharynx as per pre-operative assessment

## 2021-09-27 NOTE — Progress Notes (Signed)
Pharmacy Antibiotic Note  [name] is a 60 y.o. male who was recently dx with UTI on keflex PTA, presented to the ED on 09/25/2021 with c/o diarrhea.  Abdominal CT showed renal stone, possible acute pancreatitis and possible prostate abscess.  He was started on broad abx with vancomycin, cefepime and flagyl on admission for sepsis.  - 10/27: TURP (no evidence of prostate abscess)  Today, 09/27/2021: - day #2 abx - afeb, wbc up 29 - scr down 0.79 (crcl~ 85) - all cultures have been negative thus far  Plan: - adjust vancomycin to 750 mg IV q12h for est AUC 466 and cefepime 2gm IV q8h - Adjust flagyl to 500mg  q12h  ____________________________________________  Height: 5\' 10"  (177.8 cm) Weight: 60.2 kg (132 lb 11.5 oz) IBW/kg (Calculated) : 73  Temp (24hrs), Avg:98.1 F (36.7 C), Min:97 F (36.1 C), Max:98.6 F (37 C)  Recent Labs  Lab 09/25/21 1906 09/25/21 2145 09/26/21 0235 09/26/21 0452 09/26/21 2222 09/27/21 0323  WBC 10.8*  --  18.8*  --   --  29.0*  CREATININE 1.44*  --  1.34*  --   --  0.79  LATICACIDVEN 6.9* 7.0* 5.0* 3.8* 1.8  --     Estimated Creatinine Clearance: 84.7 mL/min (by C-G formula based on SCr of 0.79 mg/dL).    No Known Allergies  10/26 Vanc >> 10/26 Cefepime >> 10/26 Flagyl >>  10/26 BCx x2:  10/26 UCx: neg FINAL 10/27 Cdiff/PCR: neg  Thank you for allowing pharmacy to be a part of this patient's care.  11/26 09/27/2021 7:31 AM

## 2021-09-27 NOTE — Transfer of Care (Signed)
Immediate Anesthesia Transfer of Care Note  Patient: [name]  Procedure(s) Performed: TRANSRECTAL ULTRASOUND WITH ASPIRATION OF PROSTATE ABSCESS  Patient Location: PACU  Anesthesia Type:General  Level of Consciousness: awake, alert  and oriented  Airway & Oxygen Therapy: Patient Spontanous Breathing and Patient connected to face mask  Post-op Assessment: Report given to RN and Post -op Vital signs reviewed and stable  Post vital signs: Reviewed and stable  Last Vitals:  Vitals Value Taken Time  BP 93/70 09/27/21 1639  Temp    Pulse 73 09/27/21 1643  Resp 15 09/27/21 1643  SpO2 99 % 09/27/21 1643  Vitals shown include unvalidated device data.  Last Pain:  Vitals:   09/27/21 1320  TempSrc: Oral  PainSc:       Patients Stated Pain Goal: 2 (09/26/21 2219)  Complications: No notable events documented.

## 2021-09-27 NOTE — Hospital Course (Addendum)
[name] is a 60 yo male with PMH CVA, alcohol use who initially presented to the hospital with foul-smelling urine, dysuria, and uncontrolled diarrhea. He was initially seen in the ER on 09/18/2021 and discharged home with a course of Keflex, but presented back due to ongoing urinary complaints.  CT abdomen/pelvis was performed which showed a possible prostatic abscess.  Lab/Vitals work-up was notable for WBC 10.8, initial lactic acid 6.9, temp 101.2. He was started on vancomycin, cefepime, Flagyl and admitted for further evaluation and urology consultation. Abscess was unable to be located transurethrally and patient was transferred to [facility] for transrectal approach in the OR.  He was able to undergo drainage in the OR on 09/27/2021. Cultures were growing rare GPC at time of discharge.  He was transitioned to ciprofloxacin at discharge to complete 1 month course per urology.  Foley catheter was also removed prior to discharge and he was able to void well spontaneously.

## 2021-09-27 NOTE — Assessment & Plan Note (Addendum)
-   baseline creatinine ~ 1 - patient presents with increase in creat >0.3 mg/dL above baseline, creat increase >1.5x baseline presumed to have occurred within past 7 days PTA - possibly from BOO due to prostatic abscess -Creatinine returned to normal prior to discharge

## 2021-09-27 NOTE — Anesthesia Preprocedure Evaluation (Addendum)
Anesthesia Evaluation  Patient identified by MRN, date of birth, ID band Patient awake    Reviewed: Allergy & Precautions, H&P , NPO status , Patient's Chart, lab work & pertinent test results  Airway Mallampati: III  TM Distance: >3 FB Neck ROM: full    Dental  (+) Dental Advisory Given, Teeth Intact,    Pulmonary neg pulmonary ROS,    breath sounds clear to auscultation       Cardiovascular +CHF   Rhythm:Regular Rate:Normal     Neuro/Psych    GI/Hepatic   Endo/Other    Renal/GU Renal Insufficiency and ARFRenal disease     Musculoskeletal   Abdominal   Peds  Hematology  (+) anemia ,   Anesthesia Other Findings   Reproductive/Obstetrics                            Anesthesia Physical  Anesthesia Plan  ASA: 3  Anesthesia Plan: General   Post-op Pain Management:    Induction: Intravenous  PONV Risk Score and Plan: 3 and Ondansetron, Dexamethasone, Midazolam and Treatment may vary due to age or medical condition  Airway Management Planned: LMA  Additional Equipment: None  Intra-op Plan:   Post-operative Plan: Extubation in OR  Informed Consent: I have reviewed the patients History and Physical, chart, labs and discussed the procedure including the risks, benefits and alternatives for the proposed anesthesia with the patient or authorized representative who has indicated his/her understanding and acceptance.     Dental advisory given  Plan Discussed with: CRNA  Anesthesia Plan Comments:        Anesthesia Quick Evaluation

## 2021-09-27 NOTE — Assessment & Plan Note (Addendum)
-   Febrile, tachycardia, tachypnea, lactic acidosis.  Source considered prostate abscess - Treated with vancomycin, cefepime, Flagyl - s/p prostatic abscess drainage in OR with urology on 10/28 -Transitioned to ciprofloxacin at discharge to complete 1 month course

## 2021-09-27 NOTE — Progress Notes (Signed)
Progress Note    AMROM ORE   HKV:[id number]  DOB: 04/03/1961  DOA: 09/25/2021     1 Date of Service: 09/27/2021   Clinical Course [name] is a 60 yo male with PMH CVA, alcohol use who initially presented to the hospital with foul-smelling urine, dysuria, and uncontrolled diarrhea. He was initially seen in the ER on 09/18/2021 and discharged home with a course of Keflex, but presented back due to ongoing urinary complaints.  CT abdomen/pelvis was performed which showed a possible prostatic abscess.  Lab/Vitals work-up was notable for WBC 10.8, initial lactic acid 6.9, temp 101.2. He was started on vancomycin, cefepime, Flagyl and admitted for further evaluation and urology consultation. Abscess was unable to be located transurethrally and patient was transferred to [facility] for transrectal approach in the OR.  Assessment and Plan * Severe sepsis (HCC) - Febrile, tachycardia, tachypnea, lactic acidosis.  Source considered prostate abscess - Continue vancomycin, cefepime, Flagyl -Plan is for transrectal abscess drainage with urology  Complicated UTI (urinary tract infection) - see severe sepsis - continue abx - follow up cultures   AKI (acute kidney injury) (HCC) - baseline creatinine ~ 1 - patient presents with increase in creat >0.3 mg/dL above baseline, creat increase >1.5x baseline presumed to have occurred within past 7 days PTA - possibly from BOO due to prostatic abscess - foley currently in place - creat 1.44 on admission - continue foley, abx, and follow up urology eval - BMP daily    Diarrhea - negative cdiff testing - possibly from outpt keflex and/or ongoing etoh use   Hypophosphatemia - repleting as needed  Hypomagnesemia - repleting as needed  Hypokalemia - repleting as needed     Subjective:  Seen in his room this morning.  Plans are for going to the OR today with urology for better evaluation of prostatic abscess.  He denied any pain.   Urine appeared clear yellow in his Foley bag.  Objective Vitals:   09/26/21 2200 09/27/21 0212 09/27/21 0610 09/27/21 1320  BP: 99/87 90/73 90/67  95/72  Pulse: 72 75 80   Resp: 18 17 20    Temp: 97.8 F (36.6 C) 97.8 F (36.6 C) 98.5 F (36.9 C) 99 F (37.2 C)  TempSrc: Oral Oral Oral Oral  SpO2: 100%   97%  Weight: 60.2 kg     Height: 5\' 10"  (1.778 m)      60.2 kg  Vital signs were reviewed and unremarkable.   Exam Physical Exam Constitutional:      Appearance: Normal appearance.  HENT:     Head: Normocephalic and atraumatic.     Mouth/Throat:     Mouth: Mucous membranes are moist.  Eyes:     Extraocular Movements: Extraocular movements intact.     Comments: Disconjugate gaze appreciated  Cardiovascular:     Rate and Rhythm: Normal rate and regular rhythm.  Pulmonary:     Effort: Pulmonary effort is normal. No respiratory distress.     Breath sounds: Normal breath sounds. No wheezing.  Abdominal:     General: Bowel sounds are normal. There is no distension.     Palpations: Abdomen is soft.     Tenderness: There is no abdominal tenderness.  Genitourinary:    Comments: Indwelling Foley in place with clear yellow urine noted in bag Musculoskeletal:        General: No swelling. Normal range of motion.     Cervical back: Normal range of motion and neck supple.  Skin:  General: Skin is warm and dry.  Neurological:     General: No focal deficit present.     Mental Status: He is alert.  Psychiatric:        Mood and Affect: Mood normal.        Behavior: Behavior normal.     Labs / Other Information My review of labs, imaging, notes and other tests shows no new significant findings.    Disposition Plan: Status is: Inpatient  Remains inpatient appropriate because: Treatment of above     Time spent: Greater than 50% of the 35 minute visit was spent in counseling/coordination of care for the patient as laid out in the A&P.  Jonathan Chamber, MD [facility] 09/27/2021, 2:27 PM

## 2021-09-27 NOTE — Progress Notes (Signed)
Patient arrive to room 1423 via stretcher with [facility].  Report was received from [place], Charity fundraiser at [facility] prior to arrival.  CBI set up already in place with CBI large bags clamped per Dr. Dillard Essex order.  Foley output is clear yellow urine.  Patient A&OX4.  Patient oriented to room and equipment.  All questions and concerns addressed.  Patient will be NPO after MN per orders for procedure on Friday, 09/27/21.  Will complete admission, initial assessment and continue to monitor.

## 2021-09-27 NOTE — Assessment & Plan Note (Addendum)
-   see severe sepsis - continue abx

## 2021-09-27 NOTE — Op Note (Signed)
Operative Note   Preoperative diagnosis:  1.  Prostatic abscess   Postoperative diagnosis: 1.  Prostatic abscess   Procedure(s): 1.  Transrectal ultrasound guided transperineal prostatic abscess aspiration 2. Transrectal prostate ultrasound   Surgeon: Jettie Pagan, MD   Assistants:  None   Anesthesia:  General   Complications:  None   EBL:  Minimal   Specimens: 1. Prostate chips ID Type Source Tests Collected by Time Destination  1 : Prostate Chips GU Prostate Chips SURGICAL PATHOLOGY Jannifer Hick, MD 09/26/2021 1714        Drains/Catheters: 1.  18 Fr foley   Intraoperative findings:   Transrectal ultrasound demonstrated approximately 60 cc prostate with large TURP defect. There is approximately 3 x 3 cm fluid collection seen in the apical and periapical aspect of the prostate.  A 16-gauge needle was placed through the perineum and aspirated this fluid collection.  20 mL purulent fluid was aspirated.  This was sent off for culture.  The postprocedural ultrasound images demonstrated no further abscess present.   Indication:  [name] is a 60 y.o. male with history of UTI on 09/18/2021 we presented to the hospital yesterday with complaints of frequency.  He was found to be febrile, tachycardic, hypotensive with a leukocytosis of 18.8.  Urinalysis demonstrated negative nitrates, large leukocyte Estrace, urine culture from 09/18/2021 demonstrated multiple species present.  He denied perineal pain.  He did have some anal discomfort. He was taken yesterday for time for transurethral drainage however this was unsuccessful.  No rectal ultrasound probe was present at [facility].  He was thus transferred to [facility] for transrectal ultrasound guided transperineal aspiration of his prostatic abscess.     Description of procedure: The indication, alternatives, benefits and risks were discussed with the patient and informed consent was obtained.  Patient was brought to  the operating room table, positioned supine, secured with a safety strap.  Pneumatic compression devices were placed on the lower extremities.  After the administration of intravenous antibiotics and general anesthesia, the patient was repositioned into the dorsal lithotomy position.  All pressure points were carefully padded.  His Foley catheter was exchanged for an 1 French catheter.  A rectal ultrasound probe was placed into the rectum his prostate was measured approximately 60 cc with a large TURP defect.  I was able to locate the abscess which is approximately 3 x 3 cm at the most apical area of the prostate extending periprosthetic.  Using a 16-gauge needle, this abscess was aspirated through a transperineal fashion.  20 mL fluid was evacuated which appeared to be purulent.  This was sent off for aerobic and anaerobic cultures.  Post procedure images demonstrated no residual abscess.  Patient tolerated the procedure well.   Plan: Leave Foley catheter to gravity.  Follow-up prostate abscess cultures.  Continuing IV antibiotics.  He will require at least 1 month p.o. antibiotics.   [name] MD [facility]  Pager: [phone]

## 2021-09-27 NOTE — Assessment & Plan Note (Signed)
-   negative cdiff testing - possibly from outpt keflex and/or ongoing etoh use

## 2021-09-28 ENCOUNTER — Encounter (HOSPITAL_COMMUNITY): Payer: Self-pay | Admitting: Urology

## 2021-09-28 DIAGNOSIS — N412 Abscess of prostate: Secondary | ICD-10-CM

## 2021-09-28 LAB — CBC WITH DIFFERENTIAL/PLATELET
Abs Immature Granulocytes: 0.41 10*3/uL — ABNORMAL HIGH (ref 0.00–0.07)
Basophils Absolute: 0 10*3/uL (ref 0.0–0.1)
Basophils Relative: 0 %
Eosinophils Absolute: 0 10*3/uL (ref 0.0–0.5)
Eosinophils Relative: 0 %
HCT: 28.4 % — ABNORMAL LOW (ref 39.0–52.0)
Hemoglobin: 9.6 g/dL — ABNORMAL LOW (ref 13.0–17.0)
Immature Granulocytes: 2 %
Lymphocytes Relative: 11 %
Lymphs Abs: 2.9 10*3/uL (ref 0.7–4.0)
MCH: 34.9 pg — ABNORMAL HIGH (ref 26.0–34.0)
MCHC: 33.8 g/dL (ref 30.0–36.0)
MCV: 103.3 fL — ABNORMAL HIGH (ref 80.0–100.0)
Monocytes Absolute: 1.8 10*3/uL — ABNORMAL HIGH (ref 0.1–1.0)
Monocytes Relative: 7 %
Neutro Abs: 20.8 10*3/uL — ABNORMAL HIGH (ref 1.7–7.7)
Neutrophils Relative %: 80 %
Platelets: 96 10*3/uL — ABNORMAL LOW (ref 150–400)
RBC: 2.75 MIL/uL — ABNORMAL LOW (ref 4.22–5.81)
RDW: 14.2 % (ref 11.5–15.5)
WBC: 26 10*3/uL — ABNORMAL HIGH (ref 4.0–10.5)
nRBC: 0 % (ref 0.0–0.2)

## 2021-09-28 LAB — BASIC METABOLIC PANEL
Anion gap: 3 — ABNORMAL LOW (ref 5–15)
BUN: 16 mg/dL (ref 6–20)
CO2: 19 mmol/L — ABNORMAL LOW (ref 22–32)
Calcium: 7.4 mg/dL — ABNORMAL LOW (ref 8.9–10.3)
Chloride: 112 mmol/L — ABNORMAL HIGH (ref 98–111)
Creatinine, Ser: 0.98 mg/dL (ref 0.61–1.24)
GFR, Estimated: >60 mL/min (ref >60)
Glucose, Bld: 114 mg/dL — ABNORMAL HIGH (ref 70–99)
Potassium: 3.7 mmol/L (ref 3.5–5.1)
Sodium: 134 mmol/L — ABNORMAL LOW (ref 135–145)

## 2021-09-28 LAB — HIV-1/2 AB - DIFFERENTIATION
HIV 1 Ab: NONREACTIVE
HIV 2 Ab: NONREACTIVE
Note: NEGATIVE

## 2021-09-28 LAB — HIV-1/HIV-2 QUALITATIVE RNA
Final Interpretation: NEGATIVE
HIV-1 RNA, Qualitative: NONREACTIVE
HIV-2 RNA, Qualitative: NONREACTIVE

## 2021-09-28 LAB — HIV-1 RNA QUANT-NO REFLEX-BLD
HIV 1 RNA Quant: <20 copies/mL
LOG10 HIV-1 RNA: UNDETERMINED log10copy/mL

## 2021-09-28 LAB — MAGNESIUM: Magnesium: 2 mg/dL (ref 1.7–2.4)

## 2021-09-28 NOTE — Assessment & Plan Note (Addendum)
-   See severe sepsis as well - Plan is for 1 month of cipro - foley removed on 10/31 and patient was able to void spontaneously without difficulty prior to discharge

## 2021-09-28 NOTE — Progress Notes (Signed)
Progress Note    LATRAIL POUNDERS   RKY:[id number]  DOB: 06/15/1961  DOA: 09/25/2021     2 Date of Service: 09/28/2021   Clinical Course Mr. Sedeno is a 60 yo male with PMH CVA, alcohol use who initially presented to the hospital with foul-smelling urine, dysuria, and uncontrolled diarrhea. He was initially seen in the ER on 09/18/2021 and discharged home with a course of Keflex, but presented back due to ongoing urinary complaints.  CT abdomen/pelvis was performed which showed a possible prostatic abscess.  Lab/Vitals work-up was notable for WBC 10.8, initial lactic acid 6.9, temp 101.2. He was started on vancomycin, cefepime, Flagyl and admitted for further evaluation and urology consultation. Abscess was unable to be located transurethrally and patient was transferred to [facility] for transrectal approach in the OR.  He was able to undergo drainage in the OR on 09/27/2021.   Assessment and Plan * Severe sepsis (HCC) - Febrile, tachycardia, tachypnea, lactic acidosis.  Source considered prostate abscess - Continue vancomycin, cefepime, Flagyl - s/p prostatic abscess drainage in OR with urology on 10/28 - follow up cultures; currently wound cx growing GPC  Prostatic abscess - See severe sepsis as well - Plan is for 1 month of antibiotics per urology -follow-up plan for Foley removal as well  Complicated UTI (urinary tract infection) - see severe sepsis - continue abx - follow up cultures   AKI (acute kidney injury) (HCC)-resolved as of 09/28/2021 - baseline creatinine ~ 1 - patient presents with increase in creat >0.3 mg/dL above baseline, creat increase >1.5x baseline presumed to have occurred within past 7 days PTA - possibly from BOO due to prostatic abscess - foley currently in place - creat 1.44 on admission - continue foley, abx; now is s/p prostate abscess drainage as well  - creatinine has normalized  - BMP daily    Diarrhea - negative cdiff testing - possibly  from outpt keflex and/or ongoing etoh use   Hypophosphatemia - repleting as needed  Hypomagnesemia - repleting as needed  Hypokalemia - repleting as needed      Subjective:  No events overnight.  Resting comfortably in bed.  Endorses improvement in his prior pain since abscess drainage yesterday.  Appetite remains improved and normal as well. Foley catheter remains in place.  Objective Vitals:   09/27/21 1715 09/27/21 2253 09/28/21 0538 09/28/21 1241  BP: 94/73 (!) 84/66 (!) 89/73 99/71  Pulse: 72 81 70 72  Resp: 13 18 18 20   Temp: 97.6 F (36.4 C) 98.6 F (37 C) 98.7 F (37.1 C)   TempSrc:  Oral Oral Oral  SpO2: 98% 95% 96% 98%  Weight:      Height:       60.2 kg  Vital signs were reviewed and unremarkable.   Exam Physical Exam Constitutional:      General: He is not in acute distress.    Appearance: Normal appearance.  HENT:     Head: Normocephalic and atraumatic.     Mouth/Throat:     Mouth: Mucous membranes are moist.  Eyes:     Extraocular Movements: Extraocular movements intact.  Cardiovascular:     Rate and Rhythm: Normal rate and regular rhythm.     Heart sounds: Normal heart sounds.  Pulmonary:     Effort: Pulmonary effort is normal. No respiratory distress.     Breath sounds: Normal breath sounds. No wheezing.  Abdominal:     General: Bowel sounds are normal. There is no distension.  Palpations: Abdomen is soft.     Tenderness: There is no abdominal tenderness.  Genitourinary:    Comments: Indwelling Foley catheter in place with clear yellow urine noted in Foley bag Musculoskeletal:        General: Normal range of motion.     Cervical back: Normal range of motion and neck supple.  Skin:    General: Skin is warm and dry.  Neurological:     General: No focal deficit present.     Mental Status: He is alert.  Psychiatric:        Mood and Affect: Mood normal.        Behavior: Behavior normal.     Labs / Other Information My review of  labs, imaging, notes and other tests shows no new significant findings.    Disposition Plan: Status is: Inpatient  Remains inpatient appropriate because: Ongoing treatment of above     Time spent: Greater than 50% of the 35 minute visit was spent in counseling/coordination of care for the patient as laid out in the A&P.  Lewie Chamber, MD [facility] 09/28/2021, 1:43 PM

## 2021-09-28 NOTE — Discharge Instructions (Signed)
Outpatient Substance Use Treatment Services   [facility] Intensive Outpatient Program [address] [place], [place] [address]  [phone] Private insurance, Medicare A&B, and [facility]   [facility] (Alcohol and Drug Services)  [address]  [place], [place] [address] [phone] Medicaid, Self Pay   [facility]      [address]  [place], [place] [phone] Medicaid and [facility], Self Pay   [facility] [address]  [place], [place]  [phone] [facility], and Self Pay  [facility] [place]      [address]    [place], [place] [address]  [phone] or [phone] Private Insurance Only     Residential Substance Use Treatment Services   [facility]  [address]  [place], [place] [address]  [phone] or [phone] Detox (Medicare, Medicaid, private insurance, and self pay)  Residential Rehab 14 days (Medicare, [place], private insurance, and self pay)   [facility] (Residential Treatment Services)  [address] [place], [place]  [phone]  Male and Male Detox (Self Pay and Medicaid limited availability)  Rehab only Male ([place] and self pay only)   [facility] [address]      [address]  [place], [place] [address]  [phone] or [phone] Detox and Residential Treatment Private Insurance Only   [facility]  [address]  [place], [place] [address]  [phone]  Treatment Only, must make assessment appointment, and must be sober for assessment appointment.  Self Pay Only, Medicare A&B, [facility], [place] ID only! *Transportation assistance offered from [place] on [employer]     [address] [place], [place] [address] Walk in interviews M-Sat 8-4p No pending legal charges [phone]     ADATC:  [facility] Referral  [address] [place],  [place] [phone] (Self Pay, [facility])  [facility] [address] [place], [place] [address] [phone] Detox and Residential Treatment Medicare and Private Insurance  [facility] [address]  [place], [place] [address] 28 Day Women's Facility: [phone] 28 Day Men's Facility: [phone] Long-term Residential Program:  [phone] Males 25 and Over (No Insurance, upfront fee)  [facility]  [address] [place], [place] [address] [phone] Private Insurance with [place], Private Pay  [facility] [address] [place], [place] [address] Local [phone] Private Insurance Only  [facility] [address]  [place], [place] [address]  [phone] (Males, upfront fee)  [facility] [address]  [place], [address] [phone] Private Insurance      [facility] Locations  [facility]  [address]  [place], [place]  [phone] [facility] Based Program for individuals experiencing homelessness Self Pay, No insurance  [facility]  Men's program: [facility] [address] [place], [place] [address] [phone]  [facility] Women's program: [facility] [address] [place], [place] [address] [phone] Christian Based Program for individuals experiencing homelessness Self Pay, No insurance  [facility] Men's Division [address] [place], [place] [address]  [phone] [facility] [place] Based Program for individuals experiencing homelessness Self Pay, No insurance  [facility] Women's Division [address] [place], [place] [address] [phone] [facility] [place] Based Program for individuals experiencing homelessness Self Pay, No insurance  [facility] [address] [place], [place] [phone] Christian Based Program for males experiencing homelessness Self Pay, No insurance

## 2021-09-28 NOTE — Progress Notes (Signed)
1 Day Post-Op Subjective: Patient without complaints.   Objective: Vital signs in last 24 hours: Temp:  [97.6 F (36.4 C)-99 F (37.2 C)] 98.7 F (37.1 C) (10/29 0538) Pulse Rate:  [70-81] 70 (10/29 0538) Resp:  [12-18] 18 (10/29 0538) BP: (84-101)/(66-79) 89/73 (10/29 0538) SpO2:  [95 %-99 %] 96 % (10/29 0538)  Intake/Output from previous day: 10/28 0701 - 10/29 0700 In: 1384.5 [P.O.:480; IV Piggyback:904.5] Out: 825 [Urine:825] Intake/Output this shift: No intake/output data recorded.  Physical Exam:  Looks well - watching TV  Lab Results: Recent Labs    09/26/21 0235 09/27/21 0323 09/28/21 0326  HGB 10.4* 9.8* 9.6*  HCT 31.4* 28.8* 28.4*   BMET Recent Labs    09/27/21 0323 09/28/21 0326  NA 134* 134*  K 3.1* 3.7  CL 111 112*  CO2 17* 19*  GLUCOSE 186* 114*  BUN 10 16  CREATININE 0.79 0.98  CALCIUM 7.2* 7.4*   Recent Labs    09/25/21 2033 09/26/21 0235  INR 1.5* 1.7*   No results for input(s): LABURIN in the last 72 hours. Results for orders placed or performed during the hospital encounter of 09/25/21  Resp Panel by RT-PCR (Flu A&B, Covid) Nasopharyngeal Swab     Status: None   Collection Time: 09/25/21  7:07 PM   Specimen: Nasopharyngeal Swab; Nasopharyngeal(NP) swabs in vial transport medium  Result Value Ref Range Status   SARS Coronavirus 2 by RT PCR NEGATIVE NEGATIVE Final    Comment: (NOTE) SARS-CoV-2 target nucleic acids are NOT DETECTED.  The SARS-CoV-2 RNA is generally detectable in upper respiratory specimens during the acute phase of infection. The lowest concentration of SARS-CoV-2 viral copies this assay can detect is 138 copies/mL. A negative result does not preclude SARS-Cov-2 infection and should not be used as the sole basis for treatment or other patient management decisions. A negative result may occur with  improper specimen collection/handling, submission of specimen other than nasopharyngeal swab, presence of viral  mutation(s) within the areas targeted by this assay, and inadequate number of viral copies(<138 copies/mL). A negative result must be combined with clinical observations, patient history, and epidemiological information. The expected result is Negative.  Fact Sheet for Patients:  BloggerCourse.com  Fact Sheet for Healthcare Providers:  SeriousBroker.it  This test is no t yet approved or cleared by the [place] FDA and  has been authorized for detection and/or diagnosis of SARS-CoV-2 by FDA under an Emergency Use Authorization (EUA). This EUA will remain  in effect (meaning this test can be used) for the duration of the COVID-19 declaration under Section 564(b)(1) of the Act, 21 U.S.C.section 360bbb-3(b)(1), unless the authorization is terminated  or revoked sooner.       Influenza A by PCR NEGATIVE NEGATIVE Final   Influenza B by PCR NEGATIVE NEGATIVE Final    Comment: (NOTE) The Xpert Xpress SARS-CoV-2/FLU/RSV plus assay is intended as an aid in the diagnosis of influenza from Nasopharyngeal swab specimens and should not be used as a sole basis for treatment. Nasal washings and aspirates are unacceptable for Xpert Xpress SARS-CoV-2/FLU/RSV testing.  Fact Sheet for Patients: BloggerCourse.com  Fact Sheet for Healthcare Providers: SeriousBroker.it  This test is not yet approved or cleared by the [place] FDA and has been authorized for detection and/or diagnosis of SARS-CoV-2 by FDA under an Emergency Use Authorization (EUA). This EUA will remain in effect (meaning this test can be used) for the duration of the COVID-19 declaration under Section 564(b)(1) of the Act, 21  U.S.C. section 360bbb-3(b)(1), unless the authorization is terminated or revoked.  Performed at [facility], [address] [place], [place] [address]   Blood Culture (routine x 2)      Status: None (Preliminary result)   Collection Time: 09/25/21  8:33 PM   Specimen: BLOOD  Result Value Ref Range Status   Specimen Description BLOOD LEFT ANTECUBITAL  Final   Special Requests   Final    BOTTLES DRAWN AEROBIC AND ANAEROBIC Blood Culture adequate volume   Culture   Final    NO GROWTH 3 DAYS Performed at [facility], [address], [place], [place] [address]    Report Status PENDING  Incomplete  Blood Culture (routine x 2)     Status: None (Preliminary result)   Collection Time: 09/25/21  8:33 PM   Specimen: BLOOD  Result Value Ref Range Status   Specimen Description BLOOD RIGHT ANTECUBITAL  Final   Special Requests   Final    BOTTLES DRAWN AEROBIC AND ANAEROBIC Blood Culture adequate volume   Culture   Final    NO GROWTH 3 DAYS Performed at [facility], [address], [place], [place] [address]    Report Status PENDING  Incomplete  Urine Culture     Status: None   Collection Time: 09/25/21 11:17 PM   Specimen: Urine, Clean Catch  Result Value Ref Range Status   Specimen Description URINE, CLEAN CATCH  Final   Special Requests Immunocompromised  Final   Culture   Final    NO GROWTH Performed at [facility], [address] [place] [address] [place], [place] [address]    Report Status 09/26/2021 FINAL  Final  Gastrointestinal Panel by PCR , Stool     Status: None   Collection Time: 09/26/21  9:33 PM   Specimen: Stool  Result Value Ref Range Status   Campylobacter species NOT DETECTED NOT DETECTED Final   Plesimonas shigelloides NOT DETECTED NOT DETECTED Final   Salmonella species NOT DETECTED NOT DETECTED Final   Yersinia enterocolitica NOT DETECTED NOT DETECTED Final   Vibrio species NOT DETECTED NOT DETECTED Final   Vibrio cholerae NOT DETECTED NOT DETECTED Final   Enteroaggregative E coli (EAEC) NOT DETECTED NOT DETECTED Final   Enteropathogenic E coli (EPEC) NOT DETECTED NOT DETECTED Final   Enterotoxigenic E coli (ETEC) NOT DETECTED NOT DETECTED  Final   Shiga like toxin producing E coli (STEC) NOT DETECTED NOT DETECTED Final   Shigella/Enteroinvasive E coli (EIEC) NOT DETECTED NOT DETECTED Final   Cryptosporidium NOT DETECTED NOT DETECTED Final   Cyclospora cayetanensis NOT DETECTED NOT DETECTED Final   Entamoeba histolytica NOT DETECTED NOT DETECTED Final   Giardia lamblia NOT DETECTED NOT DETECTED Final   Adenovirus F40/41 NOT DETECTED NOT DETECTED Final   Astrovirus NOT DETECTED NOT DETECTED Final   Norovirus GI/GII NOT DETECTED NOT DETECTED Final   Rotavirus A NOT DETECTED NOT DETECTED Final   Sapovirus (I, II, IV, and V) NOT DETECTED NOT DETECTED Final    Comment: Performed at [facility], [address] [place], [place] [address]  C Difficile Quick Screen w PCR reflex     Status: None   Collection Time: 09/26/21  9:33 PM   Specimen: Stool  Result Value Ref Range Status   C Diff antigen NEGATIVE NEGATIVE Final   C Diff toxin NEGATIVE NEGATIVE Final   C Diff interpretation No C. difficile detected.  Final    Comment: Performed at [facility]  [facility], [address] [place], [place] [address]  Aerobic/Anaerobic Culture w Gram Stain (surgical/deep wound)     Status: None (Preliminary result)   Collection Time: 09/27/21  4:12 PM   Specimen: PATH Other; Tissue  Result Value Ref Range Status   Specimen Description   Final    ABSCESS PERI PROSTATIC Performed at [facility], [address], [place], [place] [address]    Special Requests   Final    NONE Performed at [facility], [address] [place], [place] [address]    Gram Stain   Final    NO ORGANISMS SEEN SQUAMOUS EPITHELIAL CELLS PRESENT FEW WBC PRESENT,BOTH PMN AND MONONUCLEAR RARE GRAM POSITIVE COCCI    Culture   Final    NO GROWTH < 12 HOURS Performed at [facility], [address] [place], [place] [address]    Report Status PENDING  Incomplete    Studies/Results: [place]  Intraoperative  Result Date: 09/27/2021 CLINICAL DATA:  Ultrasound was provided for use by the ordering physician.  No provider Interpretation or professional fees incurred.     Assessment/Plan:  Prostate/Pelvic abscess - s/p TURP 10/27 and TRUS guided Transperineal aspiration of prostate/pelvic abscess 10/28: -doing well -urine clear -cultures pending  I'll check with primary urologist re: when to remove foley/void trial.     LOS: 2 days   [name] 09/28/2021, 10:39 AM

## 2021-09-29 LAB — CBC WITH DIFFERENTIAL/PLATELET
Abs Immature Granulocytes: 0.11 10*3/uL — ABNORMAL HIGH (ref 0.00–0.07)
Basophils Absolute: 0 10*3/uL (ref 0.0–0.1)
Basophils Relative: 0 %
Eosinophils Absolute: 0.1 10*3/uL (ref 0.0–0.5)
Eosinophils Relative: 1 %
HCT: 28.8 % — ABNORMAL LOW (ref 39.0–52.0)
Hemoglobin: 9.6 g/dL — ABNORMAL LOW (ref 13.0–17.0)
Immature Granulocytes: 1 %
Lymphocytes Relative: 22 %
Lymphs Abs: 3.4 10*3/uL (ref 0.7–4.0)
MCH: 35.2 pg — ABNORMAL HIGH (ref 26.0–34.0)
MCHC: 33.3 g/dL (ref 30.0–36.0)
MCV: 105.5 fL — ABNORMAL HIGH (ref 80.0–100.0)
Monocytes Absolute: 1.6 10*3/uL — ABNORMAL HIGH (ref 0.1–1.0)
Monocytes Relative: 11 %
Neutro Abs: 9.9 10*3/uL — ABNORMAL HIGH (ref 1.7–7.7)
Neutrophils Relative %: 65 %
Platelets: 102 10*3/uL — ABNORMAL LOW (ref 150–400)
RBC: 2.73 MIL/uL — ABNORMAL LOW (ref 4.22–5.81)
RDW: 14.2 % (ref 11.5–15.5)
WBC: 15.2 10*3/uL — ABNORMAL HIGH (ref 4.0–10.5)
nRBC: 0 % (ref 0.0–0.2)

## 2021-09-29 LAB — BASIC METABOLIC PANEL
Anion gap: 1 — ABNORMAL LOW (ref 5–15)
BUN: 13 mg/dL (ref 6–20)
CO2: 23 mmol/L (ref 22–32)
Calcium: 7.6 mg/dL — ABNORMAL LOW (ref 8.9–10.3)
Chloride: 112 mmol/L — ABNORMAL HIGH (ref 98–111)
Creatinine, Ser: 0.94 mg/dL (ref 0.61–1.24)
GFR, Estimated: >60 mL/min (ref >60)
Glucose, Bld: 104 mg/dL — ABNORMAL HIGH (ref 70–99)
Potassium: 3.6 mmol/L (ref 3.5–5.1)
Sodium: 136 mmol/L (ref 135–145)

## 2021-09-29 LAB — MAGNESIUM: Magnesium: 1.8 mg/dL (ref 1.7–2.4)

## 2021-09-29 NOTE — Progress Notes (Signed)
2 Days Post-Op Subjective: Patient reports no complaints.  Objective: Vital signs in last 24 hours: Temp:  [98.4 F (36.9 C)-98.8 F (37.1 C)] 98.4 F (36.9 C) (10/30 1410) Pulse Rate:  [63-83] 83 (10/30 1410) Resp:  [16-18] 16 (10/30 1410) BP: (95-119)/(74-78) 104/78 (10/30 1410) SpO2:  [97 %-99 %] 99 % (10/30 0508)  Intake/Output from previous day: 10/29 0701 - 10/30 0700 In: 1069.4 [P.O.:120; IV Piggyback:949.4] Out: 3300 [Urine:3300] Intake/Output this shift: Total I/O In: -  Out: 500 [Urine:500]  Physical Exam:  Patient in bed, looks well Urine clear   Lab Results: Recent Labs    09/27/21 0323 09/28/21 0326 09/29/21 0323  HGB 9.8* 9.6* 9.6*  HCT 28.8* 28.4* 28.8*   BMET Recent Labs    09/28/21 0326 09/29/21 0323  NA 134* 136  K 3.7 3.6  CL 112* 112*  CO2 19* 23  GLUCOSE 114* 104*  BUN 16 13  CREATININE 0.98 0.94  CALCIUM 7.4* 7.6*   No results for input(s): LABPT, INR in the last 72 hours. No results for input(s): LABURIN in the last 72 hours. Results for orders placed or performed during the hospital encounter of 09/25/21  Resp Panel by RT-PCR (Flu A&B, Covid) Nasopharyngeal Swab     Status: None   Collection Time: 09/25/21  7:07 PM   Specimen: Nasopharyngeal Swab; Nasopharyngeal(NP) swabs in vial transport medium  Result Value Ref Range Status   SARS Coronavirus 2 by RT PCR NEGATIVE NEGATIVE Final    Comment: (NOTE) SARS-CoV-2 target nucleic acids are NOT DETECTED.  The SARS-CoV-2 RNA is generally detectable in upper respiratory specimens during the acute phase of infection. The lowest concentration of SARS-CoV-2 viral copies this assay can detect is 138 copies/mL. A negative result does not preclude SARS-Cov-2 infection and should not be used as the sole basis for treatment or other patient management decisions. A negative result may occur with  improper specimen collection/handling, submission of specimen other than nasopharyngeal swab,  presence of viral mutation(s) within the areas targeted by this assay, and inadequate number of viral copies(<138 copies/mL). A negative result must be combined with clinical observations, patient history, and epidemiological information. The expected result is Negative.  Fact Sheet for Patients:  BloggerCourse.com  Fact Sheet for Healthcare Providers:  SeriousBroker.it  This test is no t yet approved or cleared by the [place] FDA and  has been authorized for detection and/or diagnosis of SARS-CoV-2 by FDA under an Emergency Use Authorization (EUA). This EUA will remain  in effect (meaning this test can be used) for the duration of the COVID-19 declaration under Section 564(b)(1) of the Act, 21 U.S.C.section 360bbb-3(b)(1), unless the authorization is terminated  or revoked sooner.       Influenza A by PCR NEGATIVE NEGATIVE Final   Influenza B by PCR NEGATIVE NEGATIVE Final    Comment: (NOTE) The Xpert Xpress SARS-CoV-2/FLU/RSV plus assay is intended as an aid in the diagnosis of influenza from Nasopharyngeal swab specimens and should not be used as a sole basis for treatment. Nasal washings and aspirates are unacceptable for Xpert Xpress SARS-CoV-2/FLU/RSV testing.  Fact Sheet for Patients: BloggerCourse.com  Fact Sheet for Healthcare Providers: SeriousBroker.it  This test is not yet approved or cleared by the [place] FDA and has been authorized for detection and/or diagnosis of SARS-CoV-2 by FDA under an Emergency Use Authorization (EUA). This EUA will remain in effect (meaning this test can be used) for the duration of the COVID-19 declaration under Section 564(b)(1) of  the Act, 21 U.S.C. section 360bbb-3(b)(1), unless the authorization is terminated or revoked.  Performed at [facility], [address] [place], [place] [address]   Blood Culture  (routine x 2)     Status: None (Preliminary result)   Collection Time: 09/25/21  8:33 PM   Specimen: BLOOD  Result Value Ref Range Status   Specimen Description BLOOD LEFT ANTECUBITAL  Final   Special Requests   Final    BOTTLES DRAWN AEROBIC AND ANAEROBIC Blood Culture adequate volume   Culture   Final    NO GROWTH 4 DAYS Performed at [facility], [address] [place], [place] [address]    Report Status PENDING  Incomplete  Blood Culture (routine x 2)     Status: None (Preliminary result)   Collection Time: 09/25/21  8:33 PM   Specimen: BLOOD  Result Value Ref Range Status   Specimen Description BLOOD RIGHT ANTECUBITAL  Final   Special Requests   Final    BOTTLES DRAWN AEROBIC AND ANAEROBIC Blood Culture adequate volume   Culture   Final    NO GROWTH 4 DAYS Performed at [facility], [address], [place], [place] [address]    Report Status PENDING  Incomplete  Urine Culture     Status: None   Collection Time: 09/25/21 11:17 PM   Specimen: Urine, Clean Catch  Result Value Ref Range Status   Specimen Description URINE, CLEAN CATCH  Final   Special Requests Immunocompromised  Final   Culture   Final    NO GROWTH Performed at [facility], [address], [place], [place] [address]    Report Status 09/26/2021 FINAL  Final  Gastrointestinal Panel by PCR , Stool     Status: None   Collection Time: 09/26/21  9:33 PM   Specimen: Stool  Result Value Ref Range Status   Campylobacter species NOT DETECTED NOT DETECTED Final   Plesimonas shigelloides NOT DETECTED NOT DETECTED Final   Salmonella species NOT DETECTED NOT DETECTED Final   Yersinia enterocolitica NOT DETECTED NOT DETECTED Final   Vibrio species NOT DETECTED NOT DETECTED Final   Vibrio cholerae NOT DETECTED NOT DETECTED Final   Enteroaggregative E coli (EAEC) NOT DETECTED NOT DETECTED Final   Enteropathogenic E coli (EPEC) NOT DETECTED NOT DETECTED Final   Enterotoxigenic E coli (ETEC) NOT  DETECTED NOT DETECTED Final   Shiga like toxin producing E coli (STEC) NOT DETECTED NOT DETECTED Final   Shigella/Enteroinvasive E coli (EIEC) NOT DETECTED NOT DETECTED Final   Cryptosporidium NOT DETECTED NOT DETECTED Final   Cyclospora cayetanensis NOT DETECTED NOT DETECTED Final   Entamoeba histolytica NOT DETECTED NOT DETECTED Final   Giardia lamblia NOT DETECTED NOT DETECTED Final   Adenovirus F40/41 NOT DETECTED NOT DETECTED Final   Astrovirus NOT DETECTED NOT DETECTED Final   Norovirus GI/GII NOT DETECTED NOT DETECTED Final   Rotavirus A NOT DETECTED NOT DETECTED Final   Sapovirus (I, II, IV, and V) NOT DETECTED NOT DETECTED Final    Comment: Performed at [facility], [address] [place], [place] [address]  C Difficile Quick Screen w PCR reflex     Status: None   Collection Time: 09/26/21  9:33 PM   Specimen: Stool  Result Value Ref Range Status   C Diff antigen NEGATIVE NEGATIVE Final   C Diff toxin NEGATIVE NEGATIVE Final   C Diff interpretation No C. difficile detected.  Final    Comment:  Performed at [facility], [address] [place], [place] [address]  Aerobic/Anaerobic Culture w Gram Stain (surgical/deep wound)     Status: None (Preliminary result)   Collection Time: 09/27/21  4:12 PM   Specimen: PATH Other; Tissue  Result Value Ref Range Status   Specimen Description   Final    ABSCESS PERI PROSTATIC Performed at [facility], [address] [place], [place] [address]    Special Requests   Final    NONE Performed at [facility], [address] [place], [place] [address]    Gram Stain   Final    NO ORGANISMS SEEN SQUAMOUS EPITHELIAL CELLS PRESENT FEW WBC PRESENT,BOTH PMN AND MONONUCLEAR RARE GRAM POSITIVE COCCI    Culture   Final    NO GROWTH 2 DAYS NO ANAEROBES ISOLATED; CULTURE IN PROGRESS FOR 5 DAYS Performed at [facility], [address] [place], [place] [address]     Report Status PENDING  Incomplete    Studies/Results: [place] Intraoperative  Result Date: 09/27/2021 CLINICAL DATA:  Ultrasound was provided for use by the ordering physician.  No provider Interpretation or professional fees incurred.     Assessment/Plan: Prostate/Pelvic abscess - s/p TURP 10/27 and TRUS guided Transperineal aspiration of prostate/pelvic abscess 10/28: -d/c foley for void trial in AM - order placed  -culture with rare GPC.      LOS: 3 days   Jerilee Field 09/29/2021, 2:19 PM

## 2021-09-29 NOTE — Progress Notes (Signed)
Progress Note    [name]   [id number]  DOB: [birth year]  DOA: 09/25/2021     3 Date of Service: 09/29/2021   Clinical Course [name] is a 60 yo male with PMH CVA, alcohol use who initially presented to the hospital with foul-smelling urine, dysuria, and uncontrolled diarrhea. He was initially seen in the ER on 09/18/2021 and discharged home with a course of Keflex, but presented back due to ongoing urinary complaints.  CT abdomen/pelvis was performed which showed a possible prostatic abscess.  Lab/Vitals work-up was notable for WBC 10.8, initial lactic acid 6.9, temp 101.2. He was started on vancomycin, cefepime, Flagyl and admitted for further evaluation and urology consultation. Abscess was unable to be located transurethrally and patient was transferred to [facility] for transrectal approach in the OR.  He was able to undergo drainage in the OR on 09/27/2021.   Assessment and Plan * Severe sepsis (HCC) - Febrile, tachycardia, tachypnea, lactic acidosis.  Source considered prostate abscess - Continue vancomycin, cefepime, Flagyl - s/p prostatic abscess drainage in OR with urology on 10/28 - follow up cultures; currently wound cx growing GPC  Prostatic abscess - See severe sepsis as well - Plan is for 1 month of antibiotics per urology -follow-up plan for Foley removal as well  Complicated UTI (urinary tract infection) - see severe sepsis - continue abx - follow up cultures   AKI (acute kidney injury) (HCC)-resolved as of 09/28/2021 - baseline creatinine ~ 1 - patient presents with increase in creat >0.3 mg/dL above baseline, creat increase >1.5x baseline presumed to have occurred within past 7 days PTA - possibly from BOO due to prostatic abscess - foley currently in place - creat 1.44 on admission - continue foley, abx; now is s/p prostate abscess drainage as well  - creatinine has normalized  - BMP daily    Diarrhea - negative cdiff testing - possibly  from outpt keflex and/or ongoing etoh use   Hypophosphatemia - repleting as needed  Hypomagnesemia - repleting as needed  Hypokalemia - repleting as needed     Subjective:  No events overnight.  Resting in bed comfortably this morning.  Denied any pain.  Voiding well.  Foley remains in place.  Objective Vitals:   09/28/21 0538 09/28/21 1241 09/28/21 2130 09/29/21 0508  BP: (!) 89/73 99/71 95/76  119/74  Pulse: 70 72 67 63  Resp: 18 20 18 18   Temp: 98.7 F (37.1 C)  98.6 F (37 C) 98.8 F (37.1 C)  TempSrc: Oral Oral Oral Oral  SpO2: 96% 98% 97% 99%  Weight:      Height:       60.2 kg  Vital signs were reviewed and unremarkable.   Exam Physical Exam Constitutional:      General: He is not in acute distress.    Appearance: Normal appearance.  HENT:     Head: Normocephalic and atraumatic.     Mouth/Throat:     Mouth: Mucous membranes are moist.  Eyes:     Extraocular Movements: Extraocular movements intact.  Cardiovascular:     Rate and Rhythm: Normal rate and regular rhythm.     Heart sounds: Normal heart sounds.  Pulmonary:     Effort: Pulmonary effort is normal. No respiratory distress.     Breath sounds: Normal breath sounds. No wheezing.  Abdominal:     General: Bowel sounds are normal. There is no distension.     Palpations: Abdomen is soft.     Tenderness:  There is no abdominal tenderness.  Genitourinary:    Comments: Indwelling Foley catheter in place with clear yellow urine noted in Foley bag Musculoskeletal:        General: Normal range of motion.     Cervical back: Normal range of motion and neck supple.  Skin:    General: Skin is warm and dry.  Neurological:     General: No focal deficit present.     Mental Status: He is alert.  Psychiatric:        Mood and Affect: Mood normal.        Behavior: Behavior normal.     Labs / Other Information My review of labs, imaging, notes and other tests shows no new significant findings.     Disposition Plan: Status is: Inpatient  Remains inpatient appropriate because: Ongoing treatment of above     Time spent: Greater than 50% of the 35 minute visit was spent in counseling/coordination of care for the patient as laid out in the A&P.  Lewie Chamber, MD [facility] 09/29/2021, 1:39 PM

## 2021-09-30 LAB — CBC WITH DIFFERENTIAL/PLATELET
Abs Immature Granulocytes: 0.52 10*3/uL — ABNORMAL HIGH (ref 0.00–0.07)
Basophils Absolute: 0.1 10*3/uL (ref 0.0–0.1)
Basophils Relative: 0 %
Eosinophils Absolute: 0.2 10*3/uL (ref 0.0–0.5)
Eosinophils Relative: 1 %
HCT: 28.9 % — ABNORMAL LOW (ref 39.0–52.0)
Hemoglobin: 9.7 g/dL — ABNORMAL LOW (ref 13.0–17.0)
Immature Granulocytes: 3 %
Lymphocytes Relative: 21 %
Lymphs Abs: 3.1 10*3/uL (ref 0.7–4.0)
MCH: 34.5 pg — ABNORMAL HIGH (ref 26.0–34.0)
MCHC: 33.6 g/dL (ref 30.0–36.0)
MCV: 102.8 fL — ABNORMAL HIGH (ref 80.0–100.0)
Monocytes Absolute: 1.8 10*3/uL — ABNORMAL HIGH (ref 0.1–1.0)
Monocytes Relative: 12 %
Neutro Abs: 9.5 10*3/uL — ABNORMAL HIGH (ref 1.7–7.7)
Neutrophils Relative %: 63 %
Platelets: 117 10*3/uL — ABNORMAL LOW (ref 150–400)
RBC: 2.81 MIL/uL — ABNORMAL LOW (ref 4.22–5.81)
RDW: 14.2 % (ref 11.5–15.5)
WBC: 15.2 10*3/uL — ABNORMAL HIGH (ref 4.0–10.5)
nRBC: 0 % (ref 0.0–0.2)

## 2021-09-30 LAB — CULTURE, BLOOD (ROUTINE X 2)
Culture: NO GROWTH
Culture: NO GROWTH
Special Requests: ADEQUATE
Special Requests: ADEQUATE

## 2021-09-30 LAB — BASIC METABOLIC PANEL
Anion gap: 3 — ABNORMAL LOW (ref 5–15)
BUN: 11 mg/dL (ref 6–20)
CO2: 23 mmol/L (ref 22–32)
Calcium: 7.9 mg/dL — ABNORMAL LOW (ref 8.9–10.3)
Chloride: 109 mmol/L (ref 98–111)
Creatinine, Ser: 0.85 mg/dL (ref 0.61–1.24)
GFR, Estimated: >60 mL/min (ref >60)
Glucose, Bld: 111 mg/dL — ABNORMAL HIGH (ref 70–99)
Potassium: 3.7 mmol/L (ref 3.5–5.1)
Sodium: 135 mmol/L (ref 135–145)

## 2021-09-30 LAB — MAGNESIUM: Magnesium: 1.7 mg/dL (ref 1.7–2.4)

## 2021-09-30 LAB — SURGICAL PATHOLOGY

## 2021-09-30 MED ORDER — CIPROFLOXACIN HCL 500 MG PO TABS: 500.0000 mg | ORAL_TABLET | Freq: Two times a day (BID) | ORAL | Status: DC

## 2021-09-30 MED ORDER — CIPROFLOXACIN HCL 500 MG PO TABS: 500.0000 mg | ORAL_TABLET | Freq: Two times a day (BID) | ORAL | 0 refills | Status: AC

## 2021-09-30 NOTE — TOC Transition Note (Signed)
Transition of Care [facility]) - CM/SW Discharge Note   Patient Details  Name: [name] MRN: [id number] Date of Birth: 09-Mar-1961  [facility] CM/SW Contact:  Golda Acre, RN Phone Number: 09/30/2021, 10:31 AM   Clinical Narrative:    Has substance abuse resources from past visits.   Final next level of care: Home/Self Care Barriers to Discharge: Barriers Resolved   Patient Goals and CMS Choice Patient states their goals for this hospitalization and ongoing recovery are:: i want to go back home CMS Medicare.gov Compare Post Acute Care list provided to:: Patient    Discharge Placement                       Discharge Plan and Services   Discharge Planning Services: CM Consult                                 Social Determinants of Health (SDOH) Interventions     Readmission Risk Interventions No flowsheet data found.

## 2021-09-30 NOTE — Discharge Summary (Signed)
Physician Discharge Summary   Patient name: [name]  Admit date:     09/25/2021  Discharge date: 09/30/2021  Discharge Physician: Lewie Chamber   PCP: Pcp, No   Recommendations at discharge:  Follow up with urology   Discharge Diagnoses Principal Problem:   Severe sepsis (HCC) Active Problems:   Complicated UTI (urinary tract infection)   Prostatic abscess   Diarrhea   Hypokalemia   Hypomagnesemia   Hypophosphatemia   Resolved Diagnoses Resolved Problems:   AKI (acute kidney injury) [facility])   Hospital Course   [name] is a 60 yo male with PMH CVA, alcohol use who initially presented to the hospital with foul-smelling urine, dysuria, and uncontrolled diarrhea. He was initially seen in the ER on 09/18/2021 and discharged home with a course of Keflex, but presented back due to ongoing urinary complaints.  CT abdomen/pelvis was performed which showed a possible prostatic abscess.  Lab/Vitals work-up was notable for WBC 10.8, initial lactic acid 6.9, temp 101.2. He was started on vancomycin, cefepime, Flagyl and admitted for further evaluation and urology consultation. Abscess was unable to be located transurethrally and patient was transferred to [facility] for transrectal approach in the OR.  He was able to undergo drainage in the OR on 09/27/2021. Cultures were growing rare GPC at time of discharge.  He was transitioned to ciprofloxacin at discharge to complete 1 month course per urology.  Foley catheter was also removed prior to discharge and he was able to void well spontaneously.   * Severe sepsis (HCC) - Febrile, tachycardia, tachypnea, lactic acidosis.  Source considered prostate abscess - Treated with vancomycin, cefepime, Flagyl - s/p prostatic abscess drainage in OR with urology on 10/28 -Transitioned to ciprofloxacin at discharge to complete 1 month course  Prostatic abscess - See severe sepsis as well - Plan is for 1 month of cipro - foley removed on  10/31 and patient was able to void spontaneously without difficulty prior to discharge  Complicated UTI (urinary tract infection) - see severe sepsis - continue abx  AKI (acute kidney injury) (HCC)-resolved as of 09/28/2021 - baseline creatinine ~ 1 - patient presents with increase in creat >0.3 mg/dL above baseline, creat increase >1.5x baseline presumed to have occurred within past 7 days PTA - possibly from BOO due to prostatic abscess -Creatinine returned to normal prior to discharge   Diarrhea - negative cdiff testing - possibly from outpt keflex and/or ongoing etoh use   Hypophosphatemia - repleting as needed  Hypomagnesemia - repleting as needed  Hypokalemia - repleting as needed       Procedures performed: Transrectal ultrasound guided transperineal prostatic abscess aspiration, 09/27/21  Condition at discharge: stable  Exam Physical Exam Constitutional:      General: He is not in acute distress.    Appearance: Normal appearance.  HENT:     Head: Normocephalic and atraumatic.     Mouth/Throat:     Mouth: Mucous membranes are moist.  Eyes:     Extraocular Movements: Extraocular movements intact.  Cardiovascular:     Rate and Rhythm: Normal rate and regular rhythm.     Heart sounds: Normal heart sounds.  Pulmonary:     Effort: Pulmonary effort is normal. No respiratory distress.     Breath sounds: Normal breath sounds. No wheezing.  Abdominal:     General: Bowel sounds are normal. There is no distension.     Palpations: Abdomen is soft.     Tenderness: There is no abdominal tenderness.  Genitourinary:    Comments: Foley now removed Musculoskeletal:        General: Normal range of motion.     Cervical back: Normal range of motion and neck supple.  Skin:    General: Skin is warm and dry.  Neurological:     General: No focal deficit present.     Mental Status: He is alert.  Psychiatric:        Mood and Affect: Mood normal.        Behavior: Behavior  normal.     Disposition: Home  Discharge time: greater than 30 minutes.  Follow-up Information     [facility]. Schedule an appointment as soon as possible for a visit.   Contact information: [address] [place] [address] [phone]                Allergies as of 09/30/2021   No Known Allergies      Medication List     STOP taking these medications    cephALEXin 500 MG capsule Commonly known as: KEFLEX   folic acid 1 MG tablet Commonly known as: FOLVITE   multivitamin with minerals Tabs tablet   naproxen 500 MG tablet Commonly known as: NAPROSYN   ondansetron 4 MG disintegrating tablet Commonly known as: Zofran ODT   thiamine 100 MG tablet       TAKE these medications    ciprofloxacin 500 MG tablet Commonly known as: CIPRO Take 1 tablet (500 mg total) by mouth 2 (two) times daily.        [place] Intraoperative  Result Date: 09/27/2021 CLINICAL DATA:  Ultrasound was provided for use by the ordering physician.  No provider Interpretation or professional fees incurred.    CT ABDOMEN PELVIS W CONTRAST  Result Date: 09/26/2021 CLINICAL DATA:  Abdominal pain. EXAM: CT ABDOMEN AND PELVIS WITH CONTRAST TECHNIQUE: Multidetector CT imaging of the abdomen and pelvis was performed using the standard protocol following bolus administration of intravenous contrast. CONTRAST:  52mL OMNIPAQUE IOHEXOL 300 MG/ML  SOLN COMPARISON:  CT abdomen pelvis dated 03/30/2011. FINDINGS: Lower chest: The visualized lung bases are clear. There is coronary vascular calcification. No intra-abdominal free air or free fluid. Hepatobiliary: Diffuse fatty liver. There is diffuse thickening of the gallbladder wall, likely related to liver dysfunction. No calcified gallstone. Similar findings were seen on the prior CT of 2012. Pancreas: The pancreas is atrophic, sequela of chronic pancreas. There is however diffuse stranding and edema of the upper  mesentery in the region of the pancreas concerning for acute pancreatitis. Correlation with pancreatic enzymes recommended. No drainable fluid collection or abscess. Spleen: Calcification of the splenic capsule. Adrenals/Urinary Tract: The adrenal glands unremarkable. There is a linear 4 mm nonobstructing right renal upper pole calculus. No hydronephrosis. The left kidney is unremarkable. There is symmetric enhancement and excretion of contrast by both kidneys. The visualized ureters and urinary bladder appear unremarkable. Stomach/Bowel: There is apparent mild rectal prolapse. There is no bowel obstruction or active inflammation. The appendix is normal. Vascular/Lymphatic: Mild aortoiliac atherosclerotic disease. The IVC is unremarkable. No portal venous gas. There is no adenopathy. Reproductive: The prostate gland is mildly enlarged with median lobe hypertrophy. There is a 3.8 x 3.5 cm cystic structure at the base of the penis and inferior to the prostate gland. This may represent a urethral diverticulum. However, a prostatic abscess is not excluded clinical correlation is recommended. Other: None Musculoskeletal: No acute or significant osseous findings. IMPRESSION: 1. Possible  acute pancreatitis. No drainable fluid collection or abscess. 2. Fatty liver. 3. Atrophic pancreas sequela of chronic pancreas. 4. A 4 mm nonobstructing right renal upper pole calculus. No hydronephrosis. 5. 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 Clinical correlation is recommended. 6. Aortic Atherosclerosis (ICD10-I70.0). Electronically Signed   By: Elgie Collard M.D.   On: 09/26/2021 01:01   US RENAL  Result Date: 09/26/2021 CLINICAL DATA:  Acute renal injury. EXAM: RENAL / URINARY TRACT ULTRASOUND COMPLETE COMPARISON:  None. FINDINGS: Right Kidney: Renal measurements: 10.1 cm x 4.8 cm x 4.5 cm = volume: 114.34 mL. Diffusely increased echogenicity of the renal parenchyma is noted. No mass or hydronephrosis  visualized. Left Kidney: Renal measurements: 9.8 cm x 5.0 cm x 4.8 cm = volume: 122.66 mL. Diffusely increased echogenicity of the renal parenchyma is noted. No mass or hydronephrosis visualized. Bladder: Mild amount of debris is seen within the dependent portion of the urinary bladder Other: A trace amount of perinephric fluid is seen on the right. IMPRESSION: 1. Increased renal echogenicity which may be secondary to medical renal disease. 2. Mild amount of debris within the urinary bladder. 3. Some out of right-sided perinephric fluid. Electronically Signed   By: Aram Candela M.D.   On: 09/26/2021 01:17   DG Chest Port 1 View  Result Date: 09/25/2021 CLINICAL DATA:  Questionable sepsis. EXAM: PORTABLE CHEST 1 VIEW COMPARISON:  Chest radiograph dated 04/04/2017. FINDINGS: No focal consolidation, pleural effusion, or pneumothorax. The cardiac silhouette is within normal limits. No acute osseous pathology. IMPRESSION: No active disease. Electronically Signed   By: Elgie Collard M.D.   On: 09/25/2021 20:12   Results for orders placed or performed during the hospital encounter of 09/25/21  Resp Panel by RT-PCR (Flu A&B, Covid) Nasopharyngeal Swab     Status: None   Collection Time: 09/25/21  7:07 PM   Specimen: Nasopharyngeal Swab; Nasopharyngeal(NP) swabs in vial transport medium  Result Value Ref Range Status   SARS Coronavirus 2 by RT PCR NEGATIVE NEGATIVE Final    Comment: (NOTE) SARS-CoV-2 target nucleic acids are NOT DETECTED.  The SARS-CoV-2 RNA is generally detectable in upper respiratory specimens during the acute phase of infection. The lowest concentration of SARS-CoV-2 viral copies this assay can detect is 138 copies/mL. A negative result does not preclude SARS-Cov-2 infection and should not be used as the sole basis for treatment or other patient management decisions. A negative result may occur with  improper specimen collection/handling, submission of specimen other than  nasopharyngeal swab, presence of viral mutation(s) within the areas targeted by this assay, and inadequate number of viral copies(<138 copies/mL). A negative result must be combined with clinical observations, patient history, and epidemiological information. The expected result is Negative.  Fact Sheet for Patients:  BloggerCourse.com  Fact Sheet for Healthcare Providers:  SeriousBroker.it  This test is no t yet approved or cleared by the [place] FDA and  has been authorized for detection and/or diagnosis of SARS-CoV-2 by FDA under an Emergency Use Authorization (EUA). This EUA will remain  in effect (meaning this test can be used) for the duration of the COVID-19 declaration under Section 564(b)(1) of the Act, 21 U.S.C.section 360bbb-3(b)(1), unless the authorization is terminated  or revoked sooner.       Influenza A by PCR NEGATIVE NEGATIVE Final   Influenza B by PCR NEGATIVE NEGATIVE Final    Comment: (NOTE) The Xpert Xpress SARS-CoV-2/FLU/RSV plus assay is intended as an aid in the diagnosis of  influenza from Nasopharyngeal swab specimens and should not be used as a sole basis for treatment. Nasal washings and aspirates are unacceptable for Xpert Xpress SARS-CoV-2/FLU/RSV testing.  Fact Sheet for Patients: BloggerCourse.com  Fact Sheet for Healthcare Providers: SeriousBroker.it  This test is not yet approved or cleared by the [place] FDA and has been authorized for detection and/or diagnosis of SARS-CoV-2 by FDA under an Emergency Use Authorization (EUA). This EUA will remain in effect (meaning this test can be used) for the duration of the COVID-19 declaration under Section 564(b)(1) of the Act, 21 U.S.C. section 360bbb-3(b)(1), unless the authorization is terminated or revoked.  Performed at [facility], [address], [place], [place] [address]    Blood Culture (routine x 2)     Status: None   Collection Time: 09/25/21  8:33 PM   Specimen: BLOOD  Result Value Ref Range Status   Specimen Description BLOOD LEFT ANTECUBITAL  Final   Special Requests   Final    BOTTLES DRAWN AEROBIC AND ANAEROBIC Blood Culture adequate volume   Culture   Final    NO GROWTH 5 DAYS Performed at [facility], [address] [place], [place] [address]    Report Status 09/30/2021 FINAL  Final  Blood Culture (routine x 2)     Status: None   Collection Time: 09/25/21  8:33 PM   Specimen: BLOOD  Result Value Ref Range Status   Specimen Description BLOOD RIGHT ANTECUBITAL  Final   Special Requests   Final    BOTTLES DRAWN AEROBIC AND ANAEROBIC Blood Culture adequate volume   Culture   Final    NO GROWTH 5 DAYS Performed at [facility], [address], [place], [place] [address]    Report Status 09/30/2021 FINAL  Final  Urine Culture     Status: None   Collection Time: 09/25/21 11:17 PM   Specimen: Urine, Clean Catch  Result Value Ref Range Status   Specimen Description URINE, CLEAN CATCH  Final   Special Requests Immunocompromised  Final   Culture   Final    NO GROWTH Performed at [facility] [place] [facility], [address], [place], [place] [address]    Report Status 09/26/2021 FINAL  Final  Gastrointestinal Panel by PCR , Stool     Status: None   Collection Time: 09/26/21  9:33 PM   Specimen: Stool  Result Value Ref Range Status   Campylobacter species NOT DETECTED NOT DETECTED Final   Plesimonas shigelloides NOT DETECTED NOT DETECTED Final   Salmonella species NOT DETECTED NOT DETECTED Final   Yersinia enterocolitica NOT DETECTED NOT DETECTED Final   Vibrio species NOT DETECTED NOT DETECTED Final   Vibrio cholerae NOT DETECTED NOT DETECTED Final   Enteroaggregative E coli (EAEC) NOT DETECTED NOT DETECTED Final   Enteropathogenic E coli (EPEC) NOT DETECTED NOT DETECTED Final   Enterotoxigenic E coli (ETEC) NOT DETECTED NOT DETECTED  Final   Shiga like toxin producing E coli (STEC) NOT DETECTED NOT DETECTED Final   Shigella/Enteroinvasive E coli (EIEC) NOT DETECTED NOT DETECTED Final   Cryptosporidium NOT DETECTED NOT DETECTED Final   Cyclospora cayetanensis NOT DETECTED NOT DETECTED Final   Entamoeba histolytica NOT DETECTED NOT DETECTED Final   Giardia lamblia NOT DETECTED NOT DETECTED Final   Adenovirus F40/41 NOT DETECTED NOT DETECTED Final   Astrovirus NOT DETECTED NOT DETECTED Final   Norovirus GI/GII NOT DETECTED NOT DETECTED Final   Rotavirus A NOT DETECTED NOT DETECTED  Final   Sapovirus (I, II, IV, and V) NOT DETECTED NOT DETECTED Final    Comment: Performed at [facility], [address] [place], [place] [address]  C Difficile Quick Screen w PCR reflex     Status: None   Collection Time: 09/26/21  9:33 PM   Specimen: Stool  Result Value Ref Range Status   C Diff antigen NEGATIVE NEGATIVE Final   C Diff toxin NEGATIVE NEGATIVE Final   C Diff interpretation No C. difficile detected.  Final    Comment: Performed at [facility], [address] [place], [place] [address]  Aerobic/Anaerobic Culture w Gram Stain (surgical/deep wound)     Status: None (Preliminary result)   Collection Time: 09/27/21  4:12 PM   Specimen: PATH Other; Tissue  Result Value Ref Range Status   Specimen Description   Final    ABSCESS PERI PROSTATIC Performed at [facility], [address], [place], [place] [address]    Special Requests   Final    NONE Performed at [facility], [address], [place], [place] [address]    Gram Stain   Final    NO ORGANISMS SEEN SQUAMOUS EPITHELIAL CELLS PRESENT FEW WBC PRESENT,BOTH PMN AND MONONUCLEAR RARE GRAM POSITIVE COCCI    Culture   Final    NO GROWTH 3 DAYS NO ANAEROBES ISOLATED; CULTURE IN PROGRESS FOR 5 DAYS Performed at [facility], [address], [place], [place] [address]    Report Status PENDING   Incomplete    Signed:  Lewie Chamber MD.  [facility] 09/30/2021, 2:09 PM

## 2021-09-30 NOTE — Plan of Care (Signed)
Pt alert, no c/o pain, no s/s of respiratory distress, VSS and foley catheter dc'd @0540 , will continue plan of care.  Problem: Clinical Measurements: Goal: Will remain free from infection Outcome: Progressing

## 2021-09-30 NOTE — Progress Notes (Signed)
3 Days Post-Op Subjective: Pain controlled. No nausea or emesis. Afebrile.  Objective: Vital signs in last 24 hours: Temp:  [98.4 F (36.9 C)-99.3 F (37.4 C)] 99 F (37.2 C) (10/31 0438) Pulse Rate:  [67-98] 67 (10/31 0438) Resp:  [12-16] 12 (10/30 2000) BP: (104-125)/(78-84) 125/84 (10/31 0438) SpO2:  [97 %-98 %] 97 % (10/31 0438)  Intake/Output from previous day: 10/30 0701 - 10/31 0700 In: 1213.3 [P.O.:360; IV Piggyback:853.3] Out: 2650 [Urine:2650] Intake/Output this shift: No intake/output data recorded.  Physical Exam:  General: Alert and oriented CV: RRR Lungs: Clear Abdomen: Soft, ND, NT Ext: NT, No erythema  Lab Results: Recent Labs    09/28/21 0326 09/29/21 0323 09/30/21 0348  HGB 9.6* 9.6* 9.7*  HCT 28.4* 28.8* 28.9*   BMET Recent Labs    09/29/21 0323 09/30/21 0348  NA 136 135  K 3.6 3.7  CL 112* 109  CO2 23 23  GLUCOSE 104* 111*  BUN 13 11  CREATININE 0.94 0.85  CALCIUM 7.6* 7.9*     Studies/Results: No results found.  Assessment/Plan: Prostate/pelvic abscess: S/p TURP on 10/27 and ultrasound aspiration of abscess on 10/28  -Remains afebrile, leukocytosis downtrending -Void trial today -Urine cx 10/26 NG -Abscess culture 10/28 with rare GPC, no growth -I recommend PO cipro for 1 month -Followup with [facility] outpatient    LOS: 4 days   Matt R. Biddie Sebek MD 09/30/2021, 8:49 AM [facility]  Pager: [phone]

## 2021-09-30 NOTE — TOC Initial Note (Signed)
Transition of Care [facility]) - Initial/Assessment Note    Patient Details  Name: [name] MRN: [id number] Date of Birth: 1961/03/29  Transition of Care [facility]) CM/SW Contact:    Golda Acre, RN Phone Number: 09/30/2021, 9:24 AM  Clinical Narrative:                 60 yo male with PMH CVA, alcohol use who initially presented to the hospital with foul-smelling urine, dysuria, and uncontrolled diarrhea. He was initially seen in the ER on 09/18/2021 and discharged home with a course of Keflex, but presented back due to ongoing urinary complaints.  CT abdomen/pelvis was performed which showed a possible prostatic abscess.  Lab/Vitals work-up was notable for WBC 10.8, initial lactic acid 6.9, temp 101.2. He was started on vancomycin, cefepime, Flagyl and admitted for further evaluation and urology consultation. Abscess was unable to be located transurethrally and patient was transferred to [facility] for transrectal approach in the OR.  He was able to undergo drainage in the OR on 09/27/2021.     Assessment and Plan * Severe sepsis (HCC) - Febrile, tachycardia, tachypnea, lactic acidosis.  Source considered prostate abscess - Continue vancomycin, cefepime, Flagyl - s/p prostatic abscess drainage in OR with urology on 10/28 - follow up cultures; currently wound cx growing GPC   Prostatic abscess - See severe sepsis as well - Plan is for 1 month of antibiotics per urology -follow-up plan for Foley removal as well   Complicated UTI (urinary tract infection) - see severe sepsis - continue abx - follow up cultures    AKI (acute kidney injury) (HCC)-resolved as of 09/28/2021 - baseline creatinine ~ 1 - patient presents with increase in creat >0.3 mg/dL above baseline, creat increase >1.5x baseline presumed to have occurred within past 7 days PTA - possibly from BOO due to prostatic abscess - foley currently in place - creat 1.44 on admission - continue foley, abx; now is s/p  prostate abscess drainage as well  - creatinine has normalized  - BMP daily      Diarrhea - negative cdiff testing - possibly from outpt keflex and/or ongoing etoh use    Hypophosphatemia - repleting as needed   Hypomagnesemia - repleting as needed   Hypokalemia - repleting as needed  TOC PLAN OF CARE: IV ABX X 3, wbc 15.2, hgb 9.7, following for possible hhc needs and progression. Expected Discharge Plan: Home/Self Care Barriers to Discharge: Continued Medical Work up   Patient Goals and CMS Choice Patient states their goals for this hospitalization and ongoing recovery are:: i want to go back home CMS Medicare.gov Compare Post Acute Care list provided to:: Patient    Expected Discharge Plan and Services Expected Discharge Plan: Home/Self Care   Discharge Planning Services: CM Consult   Living arrangements for the past 2 months: Single Family Home                                      Prior Living Arrangements/Services Living arrangements for the past 2 months: Single Family Home Lives with:: Self Patient language and need for interpreter reviewed:: Yes Do you feel safe going back to the place where you live?: Yes      Need for Family Participation in Patient Care: No (Comment) Care giver support system in place?: No (comment)   Criminal Activity/Legal Involvement Pertinent to Current Situation/Hospitalization: No - Comment as needed  Activities of Daily Living Home Assistive Devices/Equipment: None ADL Screening (condition at time of admission) Patient's cognitive ability adequate to safely complete daily activities?: No Is the patient deaf or have difficulty hearing?: No Does the patient have difficulty seeing, even when wearing glasses/contacts?: No Does the patient have difficulty concentrating, remembering, or making decisions?: No Patient able to express need for assistance with ADLs?: Yes Does the patient have difficulty dressing or bathing?:  Yes Independently performs ADLs?: No Communication: Appropriate for developmental age Dressing (OT): Dependent Is this a change from baseline?: Pre-admission baseline Grooming: Dependent Is this a change from baseline?: Pre-admission baseline Feeding: Dependent Is this a change from baseline?: Pre-admission baseline Bathing: Dependent Is this a change from baseline?: Pre-admission baseline Toileting: Independent In/Out Bed: Needs assistance Is this a change from baseline?: Pre-admission baseline Walks in Home: Needs assistance Is this a change from baseline?: Pre-admission baseline Does the patient have difficulty walking or climbing stairs?: No Weakness of Legs: Both Weakness of Arms/Hands: None  Permission Sought/Granted                  Emotional Assessment Appearance:: Appears stated age Attitude/Demeanor/Rapport: Engaged Affect (typically observed): Calm Orientation: : Oriented to Self, Oriented to Place, Oriented to  Time, Oriented to Situation Alcohol / Substance Use: Not Applicable Psych Involvement: No (comment)  Admission diagnosis:  UTI (urinary tract infection) [N39.0] AKI (acute kidney injury) (HCC) [N17.9] Sepsis due to urinary tract infection (HCC) [A41.9, N39.0] Septic shock (HCC) [A41.9, R65.21] Patient Active Problem List   Diagnosis Date Noted   Prostatic abscess 09/28/2021   Hypomagnesemia 09/27/2021   Hypophosphatemia 09/27/2021   Severe sepsis (HCC) 09/26/2021   Complicated UTI (urinary tract infection) 09/26/2021   Hypokalemia 09/26/2021   Ataxia 02/09/2020   Thrombocytopenia (HCC) 02/09/2020   Macrocytic anemia 02/09/2020   Mild renal insufficiency 02/09/2020   History of CVA (cerebrovascular accident) 02/09/2020   Acute lower UTI 02/09/2020   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 [place] [address] Phone: [phone] Fax: [phone]     Social Determinants of Health (SDOH) Interventions    Readmission Risk Interventions No flowsheet data found.

## 2021-10-03 LAB — AEROBIC/ANAEROBIC CULTURE W GRAM STAIN (SURGICAL/DEEP WOUND): Culture: NO GROWTH

## 2021-10-07 ENCOUNTER — Encounter (HOSPITAL_COMMUNITY): Payer: Self-pay | Admitting: Urology
